# Patient Record
Sex: Male | Born: 1945
Health system: Southern US, Community
[De-identification: ages and names within clinical notes are randomized; demographics above are authoritative.]

## PROBLEM LIST (undated history)

## (undated) DIAGNOSIS — I10 Essential (primary) hypertension: Secondary | ICD-10-CM

## (undated) DIAGNOSIS — E785 Hyperlipidemia, unspecified: Secondary | ICD-10-CM

## (undated) DIAGNOSIS — E049 Nontoxic goiter, unspecified: Secondary | ICD-10-CM

## (undated) DIAGNOSIS — F32A Depression, unspecified: Secondary | ICD-10-CM

## (undated) DIAGNOSIS — T7840XA Allergy, unspecified, initial encounter: Secondary | ICD-10-CM

## (undated) DIAGNOSIS — I719 Aortic aneurysm of unspecified site, without rupture: Secondary | ICD-10-CM

## (undated) DIAGNOSIS — G5793 Unspecified mononeuropathy of bilateral lower limbs: Secondary | ICD-10-CM

## (undated) DIAGNOSIS — K219 Gastro-esophageal reflux disease without esophagitis: Secondary | ICD-10-CM

## (undated) DIAGNOSIS — H269 Unspecified cataract: Secondary | ICD-10-CM

## (undated) HISTORY — DX: Aortic aneurysm of unspecified site, without rupture: I71.9

## (undated) HISTORY — PX: COLONOSCOPY: SHX174

## (undated) HISTORY — DX: Depression, unspecified: F32.A

## (undated) HISTORY — DX: Hyperlipidemia, unspecified: E78.5

## (undated) HISTORY — DX: Nontoxic goiter, unspecified: E04.9

## (undated) HISTORY — DX: Gastro-esophageal reflux disease without esophagitis: K21.9

## (undated) HISTORY — DX: Unspecified mononeuropathy of bilateral lower limbs: G57.93

## (undated) HISTORY — DX: Essential (primary) hypertension: I10

## (undated) HISTORY — PX: SPINE SURGERY: SHX786

## (undated) HISTORY — PX: CARPAL TUNNEL RELEASE: SHX101

## (undated) HISTORY — DX: Allergy, unspecified, initial encounter: T78.40XA

## (undated) HISTORY — DX: Unspecified cataract: H26.9

## (undated) HISTORY — PX: UPPER GASTROINTESTINAL ENDOSCOPY: SHX188

## (undated) HISTORY — PX: ELBOW SURGERY: SHX618

---

## 1989-01-31 HISTORY — PX: CERVICAL FUSION: SHX112

## 1993-04-02 HISTORY — PX: CERVICAL DISCECTOMY: SHX98

## 1997-04-02 HISTORY — PX: FOOT SURGERY: SHX648

## 2011-04-03 HISTORY — PX: OTHER SURGICAL HISTORY: SHX169

## 2012-04-02 HISTORY — PX: SHOULDER ARTHROSCOPY WITH ROTATOR CUFF REPAIR: SHX5685

## 2018-04-02 DIAGNOSIS — C8590 Non-Hodgkin lymphoma, unspecified, unspecified site: Secondary | ICD-10-CM | POA: Insufficient documentation

## 2018-04-02 DIAGNOSIS — I82409 Acute embolism and thrombosis of unspecified deep veins of unspecified lower extremity: Secondary | ICD-10-CM

## 2018-04-02 HISTORY — DX: Acute embolism and thrombosis of unspecified deep veins of unspecified lower extremity: I82.409

## 2018-04-02 HISTORY — DX: Non-Hodgkin lymphoma, unspecified, unspecified site: C85.90

## 2018-12-30 LAB — HEMOGLOBIN A1C: Hemoglobin A1C: 5.9

## 2018-12-30 LAB — PSA: PSA: 1.63

## 2019-04-22 LAB — VITAMIN D 25 HYDROXY (VIT D DEFICIENCY, FRACTURES): Vit D, 25-Hydroxy: 28.7

## 2019-04-22 LAB — HEPATIC FUNCTION PANEL
ALT: 32 (ref 10–40)
AST: 26 (ref 14–40)
Alkaline Phosphatase: 58.9 (ref 25–125)
Bilirubin, Direct: 0.7 — AB (ref 0.01–0.4)
Bilirubin, Total: 1.2

## 2019-04-22 LAB — LIPID PANEL
Cholesterol: 140 (ref 0–200)
LDL Cholesterol: 76
Triglycerides: 135 (ref 40–160)

## 2019-04-22 LAB — CBC: RBC: 4.08 (ref 3.87–5.11)

## 2019-04-22 LAB — VITAMIN B12: Vitamin B-12: 397

## 2019-04-22 LAB — CBC AND DIFFERENTIAL
HCT: 40 — AB (ref 41–53)
Hemoglobin: 13 — AB (ref 13.5–17.5)
Neutrophils Absolute: 3.8
Platelets: 239 (ref 150–399)
WBC: 6.3

## 2019-04-22 LAB — TSH: TSH: 3.1 (ref 0.41–5.90)

## 2020-01-29 ENCOUNTER — Ambulatory Visit (INDEPENDENT_AMBULATORY_CARE_PROVIDER_SITE_OTHER): Payer: Medicare Other | Admitting: Physician Assistant

## 2020-01-29 ENCOUNTER — Encounter: Payer: Self-pay | Admitting: Physician Assistant

## 2020-01-29 ENCOUNTER — Other Ambulatory Visit: Payer: Self-pay

## 2020-01-29 VITALS — BP 120/68 | HR 60 | Temp 98.0°F | Ht 68.0 in | Wt 200.5 lb

## 2020-01-29 DIAGNOSIS — I712 Thoracic aortic aneurysm, without rupture, unspecified: Secondary | ICD-10-CM | POA: Insufficient documentation

## 2020-01-29 DIAGNOSIS — E049 Nontoxic goiter, unspecified: Secondary | ICD-10-CM

## 2020-01-29 DIAGNOSIS — Z1211 Encounter for screening for malignant neoplasm of colon: Secondary | ICD-10-CM

## 2020-01-29 DIAGNOSIS — A630 Anogenital (venereal) warts: Secondary | ICD-10-CM

## 2020-01-29 DIAGNOSIS — I10 Essential (primary) hypertension: Secondary | ICD-10-CM | POA: Insufficient documentation

## 2020-01-29 DIAGNOSIS — A6002 Herpesviral infection of other male genital organs: Secondary | ICD-10-CM

## 2020-01-29 DIAGNOSIS — E785 Hyperlipidemia, unspecified: Secondary | ICD-10-CM

## 2020-01-29 DIAGNOSIS — G5793 Unspecified mononeuropathy of bilateral lower limbs: Secondary | ICD-10-CM | POA: Diagnosis not present

## 2020-01-29 DIAGNOSIS — D229 Melanocytic nevi, unspecified: Secondary | ICD-10-CM

## 2020-01-29 DIAGNOSIS — Z8719 Personal history of other diseases of the digestive system: Secondary | ICD-10-CM | POA: Insufficient documentation

## 2020-01-29 DIAGNOSIS — F5101 Primary insomnia: Secondary | ICD-10-CM

## 2020-01-29 DIAGNOSIS — C8203 Follicular lymphoma grade I, intra-abdominal lymph nodes: Secondary | ICD-10-CM

## 2020-01-29 DIAGNOSIS — L700 Acne vulgaris: Secondary | ICD-10-CM

## 2020-01-29 DIAGNOSIS — I82409 Acute embolism and thrombosis of unspecified deep veins of unspecified lower extremity: Secondary | ICD-10-CM | POA: Insufficient documentation

## 2020-01-29 DIAGNOSIS — K219 Gastro-esophageal reflux disease without esophagitis: Secondary | ICD-10-CM

## 2020-01-29 DIAGNOSIS — F320 Major depressive disorder, single episode, mild: Secondary | ICD-10-CM

## 2020-01-29 MED ORDER — CLINDAMYCIN PHOS-BENZOYL PEROX 1-5 % EX GEL: Freq: Two times a day (BID) | CUTANEOUS | 0 refills | Status: DC

## 2020-01-29 NOTE — Progress Notes (Signed)
Taylor Berger is a 74 y.o. male is here to establish care.  I acted as a Education administrator for [employer], PA-C Anselmo Pickler, LPN   History of Present Illness:   Chief Complaint  Patient presents with  . Lymphoma  . GI referral    Colonoscopy    HPI   Pt is here to establish care today.  Neuropathy Hx of lower back fusion, neuropathy developed after that. Currently prescribed gabapentin 300 mg TID, but only takes this 300 mg nightly.  HTN Currently taking Lisinopril 5 mg daily, Norvasc 10 mg. At home blood pressure readings are: normal controlled. Patient denies chest pain, SOB, blurred vision, dizziness, unusual headaches, lower leg swelling. Patient is compliant with medication. Denies excessive caffeine intake, stimulant usage, excessive alcohol intake, or increase in salt consumption.  HLD Currently taking Lipitor 40 mg daily. Denies concerns.  Thoracic aortic aneurysm Was seeing cardiology for this regularly prior to moving. Last seen Feb 2020. Was getting regular CT scans. Per CT in Feb 2020, 4.2 cm mild aneursymal dilatation of the ascending thoracic aorta.  Insomnia Takes Trazodone 50 mg nightly since retirement. Takes this prn, sometimes only a few times a month.  Depression Was on Zoloft 100 mg daily. Stopped taking this on his own. Denies SI/HI. Has been off of this for a few years. Does not want to resume at this time.  HSV and genital warts Has been taking 500 mg daily valtrex (has 1000 mg tablets). Resumed this about 10 days ago, was off of this for 3 months. Also reports that he has history of getting cryotherapy for his genital warts.  Follicular lymphoma Had PET scan 2021. Saw oncologist, had confirmed biopsy in March 2021. Was told that he has Stage I Follicular Lymphoma. Needs local oncologist since moving.  GERD/Hx of Barretts Esophagram/Need for colonoscopy. Gets regularly EGD due to hx of Barretts. Denies concerns at this time. Takes nexium regularly.  Believes that he is due for repeat EGD/colonoscopy.  Acne/Atypical Moles Uses benzaclin regularly for acne. Needs refill. Would like a dermatologist for follow-up of skin checks, has hard skin/moles on face (eyebrow and temple) that he would evaluated.  Goiter Was being monitored every other year by ENT. Has been present for around 10 years. Overdue for follow-up on this. Due to his knowledge, has had normal thyroid labs in the past.    Health Maintenance Due  Topic Date Due  . Hepatitis C Screening  Never done  . TETANUS/TDAP  Never done  . PNA vac Low Risk Adult (1 of 2 - PCV13) Never done    Past Medical History:  Diagnosis Date  . Allergy    Seasonal  . Aortic aneurysm ([place])   . Depression   . DVT (deep venous thrombosis) ([place]) 2020  . GERD (gastroesophageal reflux disease)   . Goiter   . Hyperlipidemia   . Hypertension   . Lymphoma ([place]) 6440   Follicular, stomach  . Neuropathy of both feet      Social History   Tobacco Use  . Smoking status: Never Smoker  . Smokeless tobacco: Never Used  Vaping Use  . Vaping Use: Never used  Substance Use Topics  . Alcohol use: Never  . Drug use: Never    History reviewed. No pertinent surgical history.  Family History  Problem Relation Age of Onset  . Diabetes Mother   . Hypertension Mother   . Hyperlipidemia Mother   . Heart failure Mother   . Leukemia  Father   . Stroke Father     PMHx, SurgHx, SocialHx, FamHx, Medications, and Allergies were reviewed in the Visit Navigator and updated as appropriate.   Patient Active Problem List   Diagnosis Date Noted  . Hypertension 01/29/2020  . Thoracic aortic aneurysm ([place]) 01/29/2020  . DVT R femoral thigh 01/29/2020  . GERD (gastroesophageal reflux disease) 01/29/2020  . History of Barrett's esophagus 01/29/2020  . Goiter 01/29/2020  . Neuropathy of both feet     Social History   Tobacco Use  . Smoking status: Never Smoker  . Smokeless tobacco: Never Used    Vaping Use  . Vaping Use: Never used  Substance Use Topics  . Alcohol use: Never  . Drug use: Never    Current Medications and Allergies:    Current Outpatient Medications:  .  amLODipine (NORVASC) 10 MG tablet, Take 10 mg by mouth daily., Disp: , Rfl:  .  atorvastatin (LIPITOR) 40 MG tablet, Take 40 mg by mouth daily., Disp: , Rfl:  .  cetirizine (ZYRTEC) 10 MG tablet, Take 10 mg by mouth daily., Disp: , Rfl:  .  esomeprazole (NEXIUM) 20 MG capsule, Take 20 mg by mouth daily at 12 noon., Disp: , Rfl:  .  fluticasone (FLONASE) 50 MCG/ACT nasal spray, Place into both nostrils as needed for allergies or rhinitis., Disp: , Rfl:  .  gabapentin (NEURONTIN) 300 MG capsule, Take 300 mg by mouth 3 (three) times daily., Disp: , Rfl:  .  lisinopril (ZESTRIL) 5 MG tablet, Take 5 mg by mouth daily., Disp: , Rfl:  .  methocarbamol (ROBAXIN) 750 MG tablet, Take 750 mg by mouth as needed for muscle spasms., Disp: , Rfl:  .  traZODone (DESYREL) 50 MG tablet, Take 50 mg by mouth at bedtime as needed for sleep., Disp: , Rfl:  .  valACYclovir (VALTREX) 500 MG tablet, Take 500 mg by mouth daily., Disp: , Rfl:  .  clindamycin-benzoyl peroxide (BENZACLIN) gel, Apply topically 2 (two) times daily., Disp: 50 g, Rfl: 0  No Known Allergies  Review of Systems   ROS  Negative unless otherwise specified per HPI.  Vitals:   Vitals:   01/29/20 1342  BP: 120/68  Pulse: 60  Temp: 98 F (36.7 C)  TempSrc: Temporal  SpO2: 94%  Weight: 200 lb 8 oz (90.9 kg)  Height: 5\' 8"  (1.727 m)     Body mass index is 30.49 kg/m.   Physical Exam:    Physical Exam Vitals and nursing note reviewed.  Constitutional:      General: He is not in acute distress.    Appearance: He is well-developed. He is not ill-appearing or toxic-appearing.  Cardiovascular:     Rate and Rhythm: Normal rate and regular rhythm.     Pulses: Normal pulses.     Heart sounds: Normal heart sounds, S1 normal and S2 normal.     Comments:  No LE edema Pulmonary:     Effort: Pulmonary effort is normal.     Breath sounds: Normal breath sounds.  Skin:    General: Skin is warm and dry.  Neurological:     Mental Status: He is alert.     GCS: GCS eye subscore is 4. GCS verbal subscore is 5. GCS motor subscore is 6.  Psychiatric:        Speech: Speech normal.        Behavior: Behavior normal. Behavior is cooperative.      Assessment and Plan:    Taylor Berger  was seen today for lymphoma and gi referral.  Diagnoses and all orders for this visit:  Neuropathy of both feet Stable. Continue gabapentin 300 mg nightly. Follow-up in 6 months, sooner if concerns.  Primary hypertension Well controlled in office today. Continue Norvasc 10 mg and Lisinopril 5 mg daily. Follow-up in 6 months, sooner if concerns. -     Ambulatory referral to Cardiology  Hyperlipidemia, unspecified hyperlipidemia type Well controlled per patient. Continue lipitor 40 mg daily. Follow-up in 6 months, sooner if concerns. -     Ambulatory referral to Cardiology  Thoracic aortic aneurysm without rupture [facility]) Referral to cardiology. Stable, denies symptoms. -     Ambulatory referral to Cardiology  Primary insomnia Stable. Continue prn 50 mg Trazodone. Follow-up in 6 months, sooner if concerns.  Depression, major, single episode, mild ([place]) Denies any concerns at this time. Would like to continue to hold off on Zoloft at this time. Follow-up in 6 months, sooner if concerns.  Herpes simplex infection of other site of male genital organ Continue 500 mg valtrex daily. Follow-up in 6 months, sooner if concerns.  Genital warts Not evaluated today, will refer to dermatology. -     Ambulatory referral to Dermatology  Gastroesophageal reflux disease, unspecified whether esophagitis present; History of Barrett's esophagus; Special screening for malignant neoplasms, colon Referral to GI for further evaluation and management. Continue OTC nexium. -      Ambulatory referral to Gastroenterology  Goiter Referral to ENT. -     Ambulatory referral to ENT  Acne vulgaris; Atypical mole Refill benzaclin at this time. Referral to derm for annual skin checks and atypical mole evaluation. -     Ambulatory referral to Dermatology  Follicular lymphoma grade I of intra-abdominal lymph nodes [facility]) Referral to oncology. -     Ambulatory referral to Hematology / Oncology  Other orders -     clindamycin-benzoyl peroxide (BENZACLIN) gel; Apply topically 2 (two) times daily.   Time spent with patient today was 45 minutes which consisted of chart review, discussing diagnosis, work up, treatment answering questions and documentation.  Inda Coke, PA-C [place], [place] 01/29/2020  Follow-up: No follow-ups on file.

## 2020-01-29 NOTE — Patient Instructions (Signed)
It was great to see you!  Referrals: ENT Oncology Gastroenterology Dermatology  Please allow two weeks for referrals to be processed, if you don't hear anything by then, let us know.  Let's follow-up in 6 months, sooner if you have concerns.  Take care,  Inda Coke PA-C

## 2020-02-03 ENCOUNTER — Other Ambulatory Visit: Payer: Self-pay | Admitting: Physician Assistant

## 2020-02-03 ENCOUNTER — Telehealth: Payer: Self-pay

## 2020-02-03 NOTE — Telephone Encounter (Signed)
Pt came into office stating he is usually prescribed clindamycin-benzoyl peroxide. Pt states Samantha prescribed him gel at his last visit and he would like for that to be changed back to his original prescription (topical solution). Please advise.

## 2020-02-03 NOTE — Telephone Encounter (Signed)
Please see message. °

## 2020-02-03 NOTE — Telephone Encounter (Signed)
The only option I see that is available to me is a clindamycin external solution. Would he like this?

## 2020-02-04 NOTE — Telephone Encounter (Signed)
Left message on voicemail to call office.  

## 2020-02-05 ENCOUNTER — Other Ambulatory Visit: Payer: Self-pay | Admitting: Physician Assistant

## 2020-02-05 MED ORDER — CLINDAMYCIN PHOSPHATE 1 % EX SOLN: Freq: Two times a day (BID) | CUTANEOUS | 0 refills | Status: DC

## 2020-02-05 NOTE — Telephone Encounter (Signed)
Pt would like the external solution.

## 2020-02-05 NOTE — Telephone Encounter (Signed)
Please see message. °

## 2020-02-05 NOTE — Telephone Encounter (Signed)
Sent!

## 2020-02-05 NOTE — Telephone Encounter (Signed)
Left detailed message on voicemail Rx was sent to pharmacy.

## 2020-02-10 ENCOUNTER — Other Ambulatory Visit: Payer: Self-pay

## 2020-02-10 ENCOUNTER — Ambulatory Visit (INDEPENDENT_AMBULATORY_CARE_PROVIDER_SITE_OTHER): Payer: Medicare Other | Admitting: Internal Medicine

## 2020-02-10 VITALS — BP 134/74 | HR 51 | Ht 68.0 in | Wt 205.0 lb

## 2020-02-10 DIAGNOSIS — E785 Hyperlipidemia, unspecified: Secondary | ICD-10-CM

## 2020-02-10 DIAGNOSIS — I10 Essential (primary) hypertension: Secondary | ICD-10-CM

## 2020-02-10 DIAGNOSIS — R931 Abnormal findings on diagnostic imaging of heart and coronary circulation: Secondary | ICD-10-CM | POA: Diagnosis not present

## 2020-02-10 DIAGNOSIS — I712 Thoracic aortic aneurysm, without rupture, unspecified: Secondary | ICD-10-CM

## 2020-02-10 DIAGNOSIS — R072 Precordial pain: Secondary | ICD-10-CM | POA: Diagnosis not present

## 2020-02-10 NOTE — Progress Notes (Signed)
Cardiology Office Note:    Date:  02/10/2020   ID:  Taylor Berger, DOB [birth year], MRN [id number]  PCP:  Inda Coke, [place]  Cardiologist:  No primary care provider on file.  Electrophysiologist:  None   Referring MD: Inda Coke, PA   Chief Complaint/Reason for Referral: HTN, thoracic aortic aneurysm  History of Present Illness:    Taylor Berger is a 74 y.o. male with a history of follicular lymphoma by biopsy March 2021, HTN, thoracic aortic aneurysm measuring 4.2 cm in Feb 2020, DVT, HLD. Presents to establish cardiovascular care.   Overall feels well but notes occasional substernal, nonradiating chest discomfort. He does not have a known history of CAD per his report. Takes lisinopril and amlodipine for HTN, reasonable BP control. Takes atorvastatin for HLD, LDL in January 74. Nexium for GERD.   The patient denies dyspnea at rest or with exertion, palpitations, PND, orthopnea, or leg swelling. Denies cough, fever, chills. Denies nausea, vomiting. Denies syncope or presyncope. Denies dizziness or lightheadedness. Denies snoring.   Past Medical History:  Diagnosis Date  . Allergy    Seasonal  . Aortic aneurysm ([place])   . Depression   . DVT (deep venous thrombosis) (Taylor Berger) 2020  . GERD (gastroesophageal reflux disease)   . Goiter   . Hyperlipidemia   . Hypertension   . Lymphoma (Taylor Berger) 3785   Follicular, stomach  . Neuropathy of both feet     No past surgical history on file.  Current Medications: Current Meds  Medication Sig  . amLODipine (NORVASC) 10 MG tablet Take 10 mg by mouth daily.  Marland Kitchen atorvastatin (LIPITOR) 40 MG tablet Take 40 mg by mouth daily.  . cetirizine (ZYRTEC) 10 MG tablet Take 10 mg by mouth daily.  . clindamycin (CLEOCIN-T) 1 % external solution Apply topically 2 (two) times daily.  Marland Kitchen esomeprazole (NEXIUM) 20 MG capsule Take 20 mg by mouth daily at 12 noon.  . fluticasone (FLONASE) 50 MCG/ACT nasal spray Place into both nostrils as needed for  allergies or rhinitis.  Marland Kitchen gabapentin (NEURONTIN) 300 MG capsule Take 300 mg by mouth 3 (three) times daily.  Marland Kitchen lisinopril (ZESTRIL) 5 MG tablet Take 5 mg by mouth daily.  . valACYclovir (VALTREX) 500 MG tablet Take 500 mg by mouth daily.  . [DISCONTINUED] methocarbamol (ROBAXIN) 750 MG tablet Take 750 mg by mouth as needed for muscle spasms.  . [DISCONTINUED] traZODone (DESYREL) 50 MG tablet Take 50 mg by mouth at bedtime as needed for sleep.     Allergies:   Patient has no known allergies.   Social History   Tobacco Use  . Smoking status: Never Smoker  . Smokeless tobacco: Never Used  Vaping Use  . Vaping Use: Never used  Substance Use Topics  . Alcohol use: Never  . Drug use: Never     Family History: The patient's family history includes Diabetes in his mother; Heart failure in his mother; Hyperlipidemia in his mother; Hypertension in his mother; Leukemia in his father; Stroke in his father.  ROS:   Please see the history of present illness.    All other systems reviewed and are negative.  EKGs/Labs/Other Studies Reviewed:    The following studies were reviewed today:  EKG:  Sinus bradycardia, 1st deg AVB, left axis deviation, LVH  Recent Labs: No results found for requested labs within last 8760 hours.  Recent Lipid Panel    Component Value Date/Time   CHOL 140 04/22/2019 0000   TRIG 135 04/22/2019 0000  Taylor Berger 74 04/22/2019 0000    Physical Exam:    VS:  BP 134/74 (BP Location: Left Arm, Patient Position: Sitting, Cuff Size: Normal)   Pulse (!) 51   Ht '5\' 8"'  (1.727 m)   Wt 205 lb (93 kg)   SpO2 98%   BMI 31.17 kg/m     Wt Readings from Last 5 Encounters:  02/10/20 205 lb (93 kg)  01/29/20 200 lb 8 oz (90.9 kg)    Constitutional: No acute distress Eyes: sclera non-icteric, normal conjunctiva and lids ENMT: normal dentition, moist mucous membranes Cardiovascular: regular rhythm, normal rate, no murmurs. S1 and S2 normal. Radial pulses normal  bilaterally. No jugular venous distention.  Respiratory: clear to auscultation bilaterally GI : normal bowel sounds, soft and nontender. No distention.   MSK: extremities warm, well perfused. No edema.  NEURO: grossly nonfocal exam, moves all extremities. PSYCH: alert and oriented x 3, normal mood and affect.   ASSESSMENT:    1. Precordial pain   2. Thoracic aortic aneurysm without rupture ([place])   3. Abnormal findings on diagnostic imaging of heart and coronary circulation    4. Primary hypertension   5. Hyperlipidemia, unspecified hyperlipidemia type    PLAN:    He will need reevaluation of his thoracic aortic aneurysm in the setting of HTN. To evaluate this as well as his description of chest discomfort, we will obtain a CCTA to get a gated assessment of the coronaries, as well as a CT chest to visualize the aorta for baseline imaging in [place] and accurate measurements. He is overdue for repeat imaging, this can be performed now, with follow up afterward to determine next interval for imaging. He had a grossly normal echo from 2018 at OSH - EF 60-65%, grade 1 ddx, mild basal LVH. We will get a better look at the aorta with CT, and will perform echo at next follow up.   HTN -continue amlodipine and lisinopril.  HLD - continue atorvastatin.   Follicular lymphoma - establish with heme onc.   DVT - diagnosed 1 year ago, not currently on anticoagulation. Treated for 6 mo.  Review with oncology, presume unprovoked.    Total time of encounter: 45 minutes total time of encounter, including 30 minutes spent in face-to-face patient care on the date of this encounter. This time includes coordination of care and counseling regarding above mentioned problem list. Remainder of non-face-to-face time involved reviewing chart documents/testing relevant to the patient encounter and documentation in the medical record. I have independently reviewed documentation from referring provider.   Cherlynn Kaiser, MD [place]  [facility]    Medication Adjustments/Labs and Tests Ordered: Current medicines are reviewed at length with the patient today.  Concerns regarding medicines are outlined above.   Orders Placed This Encounter  Procedures  . CT CORONARY MORPH W/CTA COR W/SCORE W/CA W/CM &/OR WO/CM  . CT CORONARY FRACTIONAL FLOW RESERVE DATA PREP  . CT CORONARY FRACTIONAL FLOW RESERVE FLUID ANALYSIS  . CT Chest W Contrast  . Basic metabolic panel  . EKG 12-Lead    No orders of the defined types were placed in this encounter.   Patient Instructions  Medication Instructions:  No Changes In Medications at this time.  *If you need a refill on your cardiac medications before your next appointment, please call your pharmacy*  Lab Work: BMET- 1 week prior to CTA in [place]  If you have labs (blood work) drawn today and your tests are completely normal, you  will receive your results only by: Marland Kitchen MyChart Message (if you have MyChart) OR . A paper copy in the mail If you have any lab test that is abnormal or we need to change your treatment, we will call you to review the results.  Testing/Procedures: Your physician has requested that you have cardiac CT. Cardiac computed tomography (CT) is a painless test that uses an x-ray machine to take clear, detailed pictures of your heart. For further information please visit HugeFiesta.tn. Please follow instruction sheet as given.  Follow-Up: At [facility], you and your health needs are our priority.  As part of our continuing mission to provide you with exceptional heart care, we have created designated Provider Care Teams.  These Care Teams include your primary Cardiologist (physician) and Advanced Practice Providers (APPs -  Physician Assistants and Nurse Practitioners) who all work together to provide you with the care you need, when you need it.  We recommend signing up for the patient portal called "MyChart".  Sign up  information is provided on this After Visit Summary.  MyChart is used to connect with patients for Virtual Visits (Telemedicine).  Patients are able to view lab/test results, encounter notes, upcoming appointments, etc.  Non-urgent messages can be sent to your provider as well.   To learn more about what you can do with MyChart, go to [email or web address].    Your next appointment:   3 month(s) (appointment following CCTA for follow up results)  The format for your next appointment:   In Person  Provider:   Cherlynn Kaiser, MD  Other Instructions  Your cardiac CT will be scheduled at one of the below locations:   [facility] [address] [place], [place] [address] [phone]  If scheduled at [facility], please arrive at the [facility] main entrance of [facility] 30 minutes prior to test start time. Proceed to the [facility] Radiology Department (first floor) to check-in and test prep.  Please follow these instructions carefully (unless otherwise directed):  Hold all erectile dysfunction medications at least 3 days (72 hrs) prior to test.  On the Night Before the Test: . Be sure to Drink plenty of water. . Do not consume any caffeinated/decaffeinated beverages or chocolate 12 hours prior to your test. . Do not take any antihistamines 12 hours prior to your test.  On the Day of the Test: . Drink plenty of water. Do not drink any water within one hour of the test. . Do not eat any food 4 hours prior to the test. . You may take your regular medications prior to the test.  . HOLD Furosemide/Hydrochlorothiazide morning of the test. . FEMALES- please wear underwire-free bra if available  After the Test: . Drink plenty of water. . After receiving IV contrast, you may experience a mild flushed feeling. This is normal. . On occasion, you may experience a mild rash up to 24 hours after the test. This is not dangerous. If this occurs, you can  take Benadryl 25 mg and increase your fluid intake. . If you experience trouble breathing, this can be serious. If it is severe call 911 IMMEDIATELY. If it is mild, please call our office. . If you take any of these medications: Glipizide/Metformin, Avandament, Glucavance, please do not take 48 hours after completing test unless otherwise instructed.  Once we have confirmed authorization from your insurance company, we will call you to set up a date and time for your test. Based on  how quickly your insurance processes prior authorizations requests, please allow up to 4 weeks to be contacted for scheduling your Cardiac CT appointment. Be advised that routine Cardiac CT appointments could be scheduled as many as 8 weeks after your provider has ordered it.  For non-scheduling related questions, please contact the cardiac imaging nurse navigator should you have any questions/concerns: Marchia Bond, Cardiac Imaging Nurse Navigator Burley Saver, Interim Cardiac Imaging Nurse [place] [facility] Direct Office Dial: [phone]   For scheduling needs, including cancellations and rescheduling, please call Vivien Rota at [phone], option 3.

## 2020-02-10 NOTE — Patient Instructions (Addendum)
Medication Instructions:  No Changes In Medications at this time.  *If you need a refill on your cardiac medications before your next appointment, please call your pharmacy*  Lab Work: BMET- 1 week prior to CTA in [place]  If you have labs (blood work) drawn today and your tests are completely normal, you will receive your results only by: Marland Kitchen MyChart Message (if you have MyChart) OR . A paper copy in the mail If you have any lab test that is abnormal or we need to change your treatment, we will call you to review the results.  Testing/Procedures: Your physician has requested that you have cardiac CT. Cardiac computed tomography (CT) is a painless test that uses an x-ray machine to take clear, detailed pictures of your heart. For further information please visit [email or web address].tn. Please follow instruction sheet as given.  Follow-Up: At [facility], you and your health needs are our priority.  As part of our continuing mission to provide you with exceptional heart care, we have created designated Provider Care Teams.  These Care Teams include your primary Cardiologist (physician) and Advanced Practice Providers (APPs -  Physician Assistants and Nurse Practitioners) who all work together to provide you with the care you need, when you need it.  We recommend signing up for the patient portal called "MyChart".  Sign up information is provided on this After Visit Summary.  MyChart is used to connect with patients for Virtual Visits (Telemedicine).  Patients are able to view lab/test results, encounter notes, upcoming appointments, etc.  Non-urgent messages can be sent to your provider as well.   To learn more about what you can do with MyChart, go to [email or web address].    Your next appointment:   3 month(s) (appointment following CCTA for follow up results)  The format for your next appointment:   In Person  Provider:   Cherlynn Kaiser, MD  Other Instructions  Your cardiac CT  will be scheduled at one of the below locations:   [facility] [address] [place], [place] [address] [phone]  If scheduled at [facility], please arrive at the [facility] main entrance of [facility] 30 minutes prior to test start time. Proceed to the [facility] Radiology Department (first floor) to check-in and test prep.  Please follow these instructions carefully (unless otherwise directed):  Hold all erectile dysfunction medications at least 3 days (72 hrs) prior to test.  On the Night Before the Test: . Be sure to Drink plenty of water. . Do not consume any caffeinated/decaffeinated beverages or chocolate 12 hours prior to your test. . Do not take any antihistamines 12 hours prior to your test.  On the Day of the Test: . Drink plenty of water. Do not drink any water within one hour of the test. . Do not eat any food 4 hours prior to the test. . You may take your regular medications prior to the test.  . HOLD Furosemide/Hydrochlorothiazide morning of the test. . FEMALES- please wear underwire-free bra if available  After the Test: . Drink plenty of water. . After receiving IV contrast, you may experience a mild flushed feeling. This is normal. . On occasion, you may experience a mild rash up to 24 hours after the test. This is not dangerous. If this occurs, you can take Benadryl 25 mg and increase your fluid intake. . If you experience trouble breathing, this can be serious. If it is severe call 911 IMMEDIATELY. If it is  mild, please call our office. . If you take any of these medications: Glipizide/Metformin, Avandament, Glucavance, please do not take 48 hours after completing test unless otherwise instructed.  Once we have confirmed authorization from your insurance company, we will call you to set up a date and time for your test. Based on how quickly your insurance processes prior authorizations requests, please allow up to 4 weeks to  be contacted for scheduling your Cardiac CT appointment. Be advised that routine Cardiac CT appointments could be scheduled as many as 8 weeks after your provider has ordered it.  For non-scheduling related questions, please contact the cardiac imaging nurse navigator should you have any questions/concerns: Marchia Bond, Cardiac Imaging Nurse Navigator Burley Saver, Interim Cardiac Imaging Nurse [place] [facility] Direct Office Dial: [phone]   For scheduling needs, including cancellations and rescheduling, please call Vivien Rota at [phone], option 3.

## 2020-02-11 ENCOUNTER — Encounter: Payer: Self-pay | Admitting: *Deleted

## 2020-02-11 ENCOUNTER — Other Ambulatory Visit: Payer: Self-pay | Admitting: *Deleted

## 2020-02-11 NOTE — Progress Notes (Signed)
Error

## 2020-02-24 ENCOUNTER — Telehealth: Payer: Self-pay

## 2020-02-24 ENCOUNTER — Encounter: Payer: Self-pay | Admitting: Gastroenterology

## 2020-02-24 ENCOUNTER — Other Ambulatory Visit: Payer: Self-pay

## 2020-02-24 DIAGNOSIS — R931 Abnormal findings on diagnostic imaging of heart and coronary circulation: Secondary | ICD-10-CM

## 2020-02-24 DIAGNOSIS — I712 Thoracic aortic aneurysm, without rupture, unspecified: Secondary | ICD-10-CM

## 2020-02-24 DIAGNOSIS — R072 Precordial pain: Secondary | ICD-10-CM

## 2020-02-24 MED ORDER — ATORVASTATIN CALCIUM 40 MG PO TABS
40.0000 mg | ORAL_TABLET | Freq: Every day | ORAL | 1 refills | Status: DC
Start: 2020-02-24 — End: 2020-09-08

## 2020-02-24 MED ORDER — LISINOPRIL 5 MG PO TABS
5.0000 mg | ORAL_TABLET | Freq: Every day | ORAL | 1 refills | Status: DC
Start: 2020-02-24 — End: 2020-09-08

## 2020-02-24 MED ORDER — VALACYCLOVIR HCL 500 MG PO TABS
500.0000 mg | ORAL_TABLET | Freq: Every day | ORAL | 1 refills | Status: DC
Start: 2020-02-24 — End: 2020-04-12

## 2020-02-24 MED ORDER — GABAPENTIN 300 MG PO CAPS
300.0000 mg | ORAL_CAPSULE | Freq: Three times a day (TID) | ORAL | 1 refills | Status: DC
Start: 2020-02-24 — End: 2020-10-27

## 2020-02-24 MED ORDER — AMLODIPINE BESYLATE 10 MG PO TABS
10.0000 mg | ORAL_TABLET | Freq: Every day | ORAL | 1 refills | Status: DC
Start: 2020-02-24 — End: 2020-09-08

## 2020-02-24 NOTE — Telephone Encounter (Signed)
MEDICATION: valACYclovir (VALTREX) 500 MG tablet gabapentin (NEURONTIN) 300 MG capsule lisinopril (ZESTRIL) 5 MG tablet amLODipine (NORVASC) 10 MG tablet atorvastatin (LIPITOR) 40 MG tablet  PHARMACY:  [place], [place] Phone:  [phone]  Fax:  [phone]       Comments:  Patient would like 90day sent in.    **Let patient know to contact pharmacy at the end of the day to make sure medication is ready. **  ** Please notify patient to allow 48-72 hours to process**  **Encourage patient to contact the pharmacy for refills or they can request refills through [facility]**

## 2020-02-24 NOTE — Telephone Encounter (Signed)
Spoke to pt told him Rx's were sent to pharmacy 90 day supply as requested. Pt verbalized understanding.

## 2020-02-24 NOTE — Telephone Encounter (Signed)
Left message for patient to call me back in regards to a referral to the cancer center.    The cancer center is needing previous oncology notes before they will schedule.

## 2020-02-25 LAB — BASIC METABOLIC PANEL
BUN/Creatinine Ratio: 19 (ref 10–24)
BUN: 16 mg/dL (ref 8–27)
CO2: 28 mmol/L (ref 20–29)
Calcium: 9.6 mg/dL (ref 8.6–10.2)
Chloride: 106 mmol/L (ref 96–106)
Creatinine, Ser: 0.86 mg/dL (ref 0.76–1.27)
GFR calc Af Amer: 98 mL/min/{1.73_m2} (ref >59)
GFR calc non Af Amer: 85 mL/min/{1.73_m2} (ref >59)
Glucose: 86 mg/dL (ref 65–99)
Potassium: 4.5 mmol/L (ref 3.5–5.2)
Sodium: 144 mmol/L (ref 134–144)

## 2020-03-03 ENCOUNTER — Telehealth: Payer: Self-pay | Admitting: Hematology and Oncology

## 2020-03-03 NOTE — Telephone Encounter (Signed)
Received a new pt referral from Dr. Morene Rankins for follicular lymphoma. Taylor Berger cld to schedule an appt w/Dr. Lorenso Courier on 12/13 at 1pm. Pt aware to arrive 30 minutes early.

## 2020-03-07 ENCOUNTER — Encounter: Payer: Self-pay | Admitting: Physician Assistant

## 2020-03-07 ENCOUNTER — Other Ambulatory Visit: Payer: Self-pay

## 2020-03-07 ENCOUNTER — Ambulatory Visit
Admission: EM | Admit: 2020-03-07 | Discharge: 2020-03-07 | Disposition: A | Discharge status: Home / Self Care | Payer: Medicare Other | Attending: Emergency Medicine | Admitting: Emergency Medicine

## 2020-03-07 ENCOUNTER — Encounter: Payer: Self-pay | Admitting: *Deleted

## 2020-03-07 ENCOUNTER — Ambulatory Visit (INDEPENDENT_AMBULATORY_CARE_PROVIDER_SITE_OTHER): Payer: Medicare Other

## 2020-03-07 ENCOUNTER — Telehealth (INDEPENDENT_AMBULATORY_CARE_PROVIDER_SITE_OTHER): Payer: Medicare Other | Admitting: Physician Assistant

## 2020-03-07 DIAGNOSIS — R059 Cough, unspecified: Secondary | ICD-10-CM

## 2020-03-07 DIAGNOSIS — J22 Unspecified acute lower respiratory infection: Secondary | ICD-10-CM | POA: Diagnosis not present

## 2020-03-07 DIAGNOSIS — C8203 Follicular lymphoma grade I, intra-abdominal lymph nodes: Secondary | ICD-10-CM | POA: Insufficient documentation

## 2020-03-07 MED ORDER — AZITHROMYCIN 250 MG PO TABS: ORAL_TABLET | ORAL | 0 refills | Status: DC

## 2020-03-07 MED ORDER — BENZONATATE 200 MG PO CAPS: 200.0000 mg | ORAL_CAPSULE | Freq: Three times a day (TID) | ORAL | 0 refills | Status: AC | PRN

## 2020-03-07 MED ORDER — ACETAMINOPHEN 325 MG PO TABS
650.0000 mg | ORAL_TABLET | Freq: Once | ORAL | Status: AC
  Administered 2020-03-07: 650 mg via ORAL

## 2020-03-07 MED ORDER — AMOXICILLIN-POT CLAVULANATE 875-125 MG PO TABS: 1.0000 | ORAL_TABLET | Freq: Two times a day (BID) | ORAL | 0 refills | Status: DC

## 2020-03-07 MED ORDER — PREDNISONE 50 MG PO TABS: 50.0000 mg | ORAL_TABLET | Freq: Every day | ORAL | 0 refills | Status: DC

## 2020-03-07 MED ORDER — METHYLPREDNISOLONE SODIUM SUCC 125 MG IJ SOLR
80.0000 mg | Freq: Once | INTRAMUSCULAR | Status: AC
  Administered 2020-03-07: 80 mg via INTRAMUSCULAR

## 2020-03-07 MED ORDER — ALBUTEROL SULFATE HFA 108 (90 BASE) MCG/ACT IN AERS
2.0000 | INHALATION_SPRAY | Freq: Once | RESPIRATORY_TRACT | Status: AC
  Administered 2020-03-07: 2 via RESPIRATORY_TRACT

## 2020-03-07 NOTE — Progress Notes (Signed)
Virtual Visit via Video   I connected with Taylor Berger on 03/07/20 at 12:30 PM EST by a video enabled telemedicine application and verified that I am speaking with the correct person using two identifiers. Location patient: Home Location provider: [facility], Office Persons participating in the virtual visit: Taylor Berger, [place] PA-C  I discussed the limitations of evaluation and management by telemedicine and the availability of in person appointments. The patient expressed understanding and agreed to proceed.  Subjective:   HPI:   Patient is requesting evaluation for URI.  1 week of cold symptoms, started after spending Thanksgiving with children and grandchildren who had colds.  Symptom onset: 1 week  Vaccination status: fully vaccinated for COVID and flue  Patient endorses the following symptoms: sinus congestion, productive cough (productive), wheezing and shortness of breath, subjective fever, chest pain, poor appetite, fatigue  Treatments tried: expectorant  Patient risk factors: Current LEXNT-70 risk of complications score: 5 Smoking status: Taylor Berger  reports that he has never smoked. He has never used smokeless tobacco. If male, currently pregnant? []   Yes []   No  ROS: See pertinent positives and negatives per HPI.  Patient Active Problem List   Diagnosis Date Noted  . Follicular lymphoma grade I of intra-abdominal lymph nodes ([place]) 03/07/2020  . Hypertension 01/29/2020  . Thoracic aortic aneurysm ([place]) 01/29/2020  . DVT R femoral thigh 01/29/2020  . GERD (gastroesophageal reflux disease) 01/29/2020  . History of Barrett's esophagus 01/29/2020  . Goiter 01/29/2020  . Neuropathy of both feet   . Lymphoma ([place]) 2020    Social History   Tobacco Use  . Smoking status: Never Smoker  . Smokeless tobacco: Never Used  Substance Use Topics  . Alcohol use: Never    Current Outpatient Medications:  .  amLODipine (NORVASC) 10 MG tablet, Take 1  tablet (10 mg total) by mouth daily., Disp: 90 tablet, Rfl: 1 .  atorvastatin (LIPITOR) 40 MG tablet, Take 1 tablet (40 mg total) by mouth daily., Disp: 90 tablet, Rfl: 1 .  cetirizine (ZYRTEC) 10 MG tablet, Take 10 mg by mouth daily., Disp: , Rfl:  .  clindamycin (CLEOCIN-T) 1 % external solution, Apply topically 2 (two) times daily., Disp: 30 mL, Rfl: 0 .  esomeprazole (NEXIUM) 20 MG capsule, Take 20 mg by mouth daily at 12 noon., Disp: , Rfl:  .  fluticasone (FLONASE) 50 MCG/ACT nasal spray, Place into both nostrils as needed for allergies or rhinitis., Disp: , Rfl:  .  gabapentin (NEURONTIN) 300 MG capsule, Take 1 capsule (300 mg total) by mouth 3 (three) times daily., Disp: 270 capsule, Rfl: 1 .  lisinopril (ZESTRIL) 5 MG tablet, Take 1 tablet (5 mg total) by mouth daily., Disp: 90 tablet, Rfl: 1 .  valACYclovir (VALTREX) 500 MG tablet, Take 1 tablet (500 mg total) by mouth daily., Disp: 90 tablet, Rfl: 1  No Known Allergies  Objective:   VITALS: Per patient if applicable, see vitals. GENERAL: Alert, appears well and in no acute distress. HEENT: Atraumatic, conjunctiva clear, no obvious abnormalities on inspection of external nose and ears. NECK: Normal movements of the head and neck. CARDIOPULMONARY: No increased WOB. Speaking in clear sentences. I:E ratio WNL.  MS: Moves all visible extremities without noticeable abnormality. PSYCH: Pleasant and cooperative, well-groomed. Speech normal rate and rhythm. Affect is appropriate. Insight and judgement are appropriate. Attention is focused, linear, and appropriate.  NEURO: CN grossly intact. Oriented as arrived to appointment on time with no prompting. Moves  both UE equally.  SKIN: No obvious lesions, wounds, erythema, or cyanosis noted on face or hands.  Assessment and Plan:   Taylor Berger was seen today for shortness of breath, wheezing, cough, abdominal pain and losing his voice.  Diagnoses and all orders for this visit:  Cough   Due to  chest pain, SOB, dyspnea and multiple chronic medical issues, recommend patient go to ER or UC for evaluation. He has opted for Urgent Care. Will follow along case as he goes to Urgent Care for further evaluation.  I discussed the assessment and treatment plan with the patient. The patient was provided an opportunity to ask questions and all were answered. The patient agreed with the plan and demonstrated an understanding of the instructions.   The patient was advised to call back or seek an in-person evaluation if the symptoms worsen or if the condition fails to improve as anticipated.    Taylor Berger, [place] 03/07/2020

## 2020-03-07 NOTE — ED Triage Notes (Signed)
Patient reports 10 day history of cough, nasal congestion, wheezing and shortness of breath. Reports fever but did not check. Patient with shortened sentences during triage due to coughing and shortness of breath, audible intermittent wheezing with cough.   Has taken cough medicine and mucinex.

## 2020-03-07 NOTE — Discharge Instructions (Signed)
Augmentin twice daily for 1 week Azithromycin- 2 tabs tonight, 1 tab following 4 days Continue inhaler as needed Prednisone daily Go to emergency room if oxygen not improving, worsening

## 2020-03-07 NOTE — ED Notes (Signed)
Additional 2 puffs of albuterol

## 2020-03-07 NOTE — ED Notes (Signed)
Patient states that he is feeling better--less shortness of breath and coughing. Patient appears better and appears more relaxed.

## 2020-03-08 ENCOUNTER — Telehealth: Payer: Self-pay | Admitting: Emergency Medicine

## 2020-03-08 ENCOUNTER — Ambulatory Visit (HOSPITAL_COMMUNITY): Admission: RE | Admit: 2020-03-08 | Payer: Medicare Other | Source: Ambulatory Visit

## 2020-03-08 ENCOUNTER — Encounter (HOSPITAL_COMMUNITY): Payer: Self-pay | Admitting: Emergency Medicine

## 2020-03-08 ENCOUNTER — Ambulatory Visit (HOSPITAL_COMMUNITY): Payer: Medicare Other

## 2020-03-08 ENCOUNTER — Emergency Department (HOSPITAL_COMMUNITY)
Admission: EM | Admit: 2020-03-08 | Discharge: 2020-03-08 | Disposition: A | Discharge status: Home / Self Care | Payer: Medicare Other | Attending: Emergency Medicine | Admitting: Emergency Medicine

## 2020-03-08 DIAGNOSIS — Z5321 Procedure and treatment not carried out due to patient leaving prior to being seen by health care provider: Secondary | ICD-10-CM | POA: Diagnosis not present

## 2020-03-08 DIAGNOSIS — R0902 Hypoxemia: Secondary | ICD-10-CM | POA: Diagnosis not present

## 2020-03-08 DIAGNOSIS — J209 Acute bronchitis, unspecified: Secondary | ICD-10-CM | POA: Diagnosis not present

## 2020-03-08 DIAGNOSIS — J189 Pneumonia, unspecified organism: Secondary | ICD-10-CM | POA: Insufficient documentation

## 2020-03-08 NOTE — Telephone Encounter (Signed)
Called patient for follow-up from visit last night, reported he was able to pick up antibiotics, breathing has continued to be improved from initial presentation, but feels his hoarseness is returning.  Obtained pulse oximeter over-the-counter, but was not able to get this working correctly at home.  Encourage patient to follow-up in emergency room given O2 last night, advised he may return here as well for recheck of O2, but if symptoms not improving he will need further evaluation in emergency room.

## 2020-03-08 NOTE — ED Provider Notes (Addendum)
[facility]    CSN: [id number] Arrival date & time: 03/07/20  1553      History   Chief Complaint Chief Complaint  Patient presents with  . Cough  . Nasal Congestion  . Shortness of Breath    HPI Taylor Berger is a 74 y.o. male history of aortic aneurysm, GERD, hypertension, presenting today for evaluation of a cough.  Patient reports over the past 10 days he has had cough congestion wheezing and shortness of breath.  He has had some subjective fevers.  Symptoms have persisted and slightly worse shortness of breath and wheezing of recently.  Denies history of asthma, COPD or tobacco use.  He has had prior bronchitis and pneumonia.  Using over-the-counter cough medicine and Mucinex.  Denies leg pain or leg swelling.    HPI  Past Medical History:  Diagnosis Date  . Allergy    Seasonal  . Aortic aneurysm ([place])   . Depression   . DVT (deep venous thrombosis) ([place]) 2020  . GERD (gastroesophageal reflux disease)   . Goiter   . Hyperlipidemia   . Hypertension   . Lymphoma ([place]) 5643   Follicular, stomach  . Neuropathy of both feet     Patient Active Problem List   Diagnosis Date Noted  . Follicular lymphoma grade I of intra-abdominal lymph nodes ([place]) 03/07/2020  . Hypertension 01/29/2020  . Thoracic aortic aneurysm ([place]) 01/29/2020  . DVT R femoral thigh 01/29/2020  . GERD (gastroesophageal reflux disease) 01/29/2020  . History of Barrett's esophagus 01/29/2020  . Goiter 01/29/2020  . Neuropathy of both feet   . Lymphoma ([place]) 2020    Past Surgical History:  Procedure Laterality Date  . CERVICAL DISCECTOMY  1995   C-7  . CERVICAL FUSION  01/1989   C-3 through C-6 / no metal  . FOOT SURGERY Left 1999   Plantar fascitis  . low back fusion  2013   Lumbar & Low Thoracic with metal  . SHOULDER ARTHROSCOPY WITH ROTATOR CUFF REPAIR Right 2014       Home Medications    Prior to Admission medications   Medication Sig Start Date End Date Taking?  Authorizing Provider  amLODipine (NORVASC) 10 MG tablet Take 1 tablet (10 mg total) by mouth daily. 02/24/20   Inda Coke, PA  amoxicillin-clavulanate (AUGMENTIN) 875-125 MG tablet Take 1 tablet by mouth every 12 (twelve) hours for 7 days. 03/07/20 03/14/20  Markea Ruzich C, PA-C  atorvastatin (LIPITOR) 40 MG tablet Take 1 tablet (40 mg total) by mouth daily. 02/24/20   Inda Coke, PA  azithromycin (ZITHROMAX) 250 MG tablet Take first 2 tablets together, then 1 every day until finished. 03/07/20   Sheryl Saintil C, PA-C  benzonatate (TESSALON) 200 MG capsule Take 1 capsule (200 mg total) by mouth 3 (three) times daily as needed for up to 7 days for cough. 03/07/20 03/14/20  Laurice Iglesia C, PA-C  cetirizine (ZYRTEC) 10 MG tablet Take 10 mg by mouth daily.    [provider]  clindamycin (CLEOCIN-T) 1 % external solution Apply topically 2 (two) times daily. 02/05/20   Inda Coke, PA  esomeprazole (NEXIUM) 20 MG capsule Take 20 mg by mouth daily at 12 noon.    [provider]  fluticasone (FLONASE) 50 MCG/ACT nasal spray Place into both nostrils as needed for allergies or rhinitis.    [provider]  gabapentin (NEURONTIN) 300 MG capsule Take 1 capsule (300 mg total) by mouth 3 (three) times daily. 02/24/20  Inda Coke, PA  lisinopril (ZESTRIL) 5 MG tablet Take 1 tablet (5 mg total) by mouth daily. 02/24/20   Inda Coke, PA  predniSONE (DELTASONE) 50 MG tablet Take 1 tablet (50 mg total) by mouth daily with breakfast. 03/07/20   Recie Cirrincione C, PA-C  valACYclovir (VALTREX) 500 MG tablet Take 1 tablet (500 mg total) by mouth daily. 02/24/20   Inda Coke, PA    Family History Family History  Problem Relation Age of Onset  . Diabetes Mother   . Hypertension Mother   . Hyperlipidemia Mother   . Heart failure Mother   . Leukemia Father   . Stroke Father     Social History Social History   Tobacco Use  . Smoking status: Never  Smoker  . Smokeless tobacco: Never Used  Vaping Use  . Vaping Use: Never used  Substance Use Topics  . Alcohol use: Never  . Drug use: Never     Allergies   Patient has no known allergies.   Review of Systems Review of Systems  Constitutional: Positive for chills, fatigue and fever. Negative for activity change and appetite change.  HENT: Positive for congestion. Negative for ear pain, rhinorrhea, sinus pressure, sore throat and trouble swallowing.   Eyes: Negative for discharge and redness.  Respiratory: Positive for cough, shortness of breath and wheezing. Negative for chest tightness.   Cardiovascular: Negative for chest pain.  Gastrointestinal: Negative for abdominal pain, diarrhea, nausea and vomiting.  Musculoskeletal: Negative for myalgias.  Skin: Negative for rash.  Neurological: Negative for dizziness, light-headedness and headaches.     Physical Exam Triage Vital Signs ED Triage Vitals  Enc Vitals Group     BP 03/07/20 1747 110/78     Pulse Rate 03/07/20 1747 85     Resp 03/07/20 1747 20     Temp 03/07/20 1747 (!) 100.7 F (38.2 C)     Temp Source 03/07/20 1747 Oral     SpO2 03/07/20 1747 (!) 87 %     Weight --      Height --      Head Circumference --      Peak Flow --      Pain Score 03/07/20 1745 5     Pain Loc --      Pain Edu? --      Excl. in [place]? --    No data found.  Updated Vital Signs BP 110/78 (BP Location: Left Arm)   Pulse 80   Temp (!) 100.7 F (38.2 C) (Oral)   Resp 20   SpO2 (!) 89%   Visual Acuity Right Eye Distance:   Left Eye Distance:   Bilateral Distance:    Right Eye Near:   Left Eye Near:    Bilateral Near:     Physical Exam Vitals and nursing note reviewed.  Constitutional:      Appearance: He is well-developed.     Comments: No acute distress  HENT:     Head: Normocephalic and atraumatic.     Ears:     Comments: Bilateral ears without tenderness to palpation of external auricle, tragus and mastoid, EAC's  without erythema or swelling, TM's with good bony landmarks and cone of light. Non erythematous.     Nose: Nose normal.     Mouth/Throat:     Comments: Oral mucosa pink and moist, no tonsillar enlargement or exudate. Posterior pharynx patent and nonerythematous, no uvula deviation or swelling. Normal phonation. Eyes:     Conjunctiva/sclera: Conjunctivae normal.  Cardiovascular:     Rate and Rhythm: Normal rate.  Pulmonary:     Effort: Pulmonary effort is normal. No respiratory distress.     Comments: Voice is hoarse, appears slightly short of breath with slightly audible wheezing, breath sound slightly coarse with mild crackles throughout bilateral lung fields Abdominal:     General: There is no distension.  Musculoskeletal:        General: Normal range of motion.     Cervical back: Neck supple.  Skin:    General: Skin is warm and dry.  Neurological:     Mental Status: He is alert and oriented to person, place, and time.      UC Treatments / Results  Labs (all labs ordered are listed, but only abnormal results are displayed) Labs Reviewed - No data to display  EKG   Radiology DG Chest 2 View  Result Date: 03/07/2020 CLINICAL DATA:  Productive cough. EXAM: CHEST - 2 VIEW COMPARISON:  None. FINDINGS: Mild diffuse chronic appearing increased interstitial lung markings are seen. Very mild atelectasis and/or early infiltrate is seen within the retrocardiac region of the left lung base. There is no evidence of a pleural effusion or pneumothorax. The heart size and mediastinal contours are within normal limits. A radiopaque fusion plate and screws are seen overlying the lower cervical spine. The visualized skeletal structures are otherwise unremarkable. IMPRESSION: Chronic interstitial lung disease with very mild left basilar atelectasis and/or early infiltrate. Electronically Signed   By: Virgina Norfolk M.D.   On: 03/07/2020 19:08    Procedures Procedures (including critical care  time)  Medications Ordered in UC Medications  acetaminophen (TYLENOL) tablet 650 mg (650 mg Oral Given 03/07/20 1751)  albuterol (VENTOLIN HFA) 108 (90 Base) MCG/ACT inhaler 2 puff (2 puffs Inhalation Given 03/07/20 1856)  methylPREDNISolone sodium succinate (SOLU-MEDROL) 125 mg/2 mL injection 80 mg (80 mg Intramuscular Given 03/07/20 1856)    Initial Impression / Assessment and Plan / UC Course  I have reviewed the triage vital signs and the nursing notes.  Pertinent labs & imaging results that were available during my care of the patient were reviewed by me and considered in my medical decision making (see chart for details).  Clinical Course as of Mar 08 1221  Mon Mar 07, 2020  1921 89% with additional 2 puffs albuterol   [HW]    Clinical Course User Index [HW] Afomia Blackley, [place] C, PA-C    Chest x-ray with questionable early pneumonia, would not expect this to cause his hypoxia.  Provided albuterol inhaler puffs along with Solu-Medrol IM.  Patient clinically had improvement of his shortness of breath and wheezing, but continued to remain hypoxic around 88-90% at rest.  Discussed these concerns with patient and his wife, initial recommendations were to go to emergency room.  With discussions and given current weight in emergency room opted to initiate on antibiotic therapy overnight with continued close monitoring of symptoms.  Patient is to report to the emergency room in the morning if not seeing any improvement.  He plans to obtain a pulse ox from over-the-counter to check his O2 at home.  Stressed importance of this and further follow-up and evaluation if not improving at home.  Discussed strict return precautions. Patient verbalized understanding and is agreeable with plan.  Final Clinical Impressions(s) / UC Diagnoses   Final diagnoses:  Lower respiratory infection (e.g., bronchitis, pneumonia, pneumonitis, pulmonitis)     Discharge Instructions     Augmentin twice daily for 1  week Azithromycin- 2 tabs tonight, 1 tab following 4 days Continue inhaler as needed Prednisone daily Go to emergency room if oxygen not improving, worsening   ED Prescriptions    Medication Sig Dispense Auth. Provider   amoxicillin-clavulanate (AUGMENTIN) 875-125 MG tablet Take 1 tablet by mouth every 12 (twelve) hours for 7 days. 14 tablet Auriel Kist C, PA-C   azithromycin (ZITHROMAX) 250 MG tablet Take first 2 tablets together, then 1 every day until finished. 4 tablet Lucina Betty C, PA-C   benzonatate (TESSALON) 200 MG capsule Take 1 capsule (200 mg total) by mouth 3 (three) times daily as needed for up to 7 days for cough. 28 capsule Torre Pikus C, PA-C   predniSONE (DELTASONE) 50 MG tablet Take 1 tablet (50 mg total) by mouth daily with breakfast. 5 tablet Neilan Rizzo C, PA-C     PDMP not reviewed this encounter.   Joneen Caraway, [place] C, PA-C 03/08/20 1226    Akiyah Eppolito, [place] C, PA-C 03/08/20 1227

## 2020-03-08 NOTE — ED Triage Notes (Signed)
Reports being dx w/ PNA and bronchitis yesterday, states his oxygen level was low yesterday at the UC. Talking freely in triage. His PCP is concerned that he has a 'clot on my lung.'

## 2020-03-09 ENCOUNTER — Telehealth: Payer: Self-pay

## 2020-03-09 NOTE — Telephone Encounter (Signed)
Please see message and advise 

## 2020-03-09 NOTE — Telephone Encounter (Signed)
Spoke to pt told him per Aldona Bar, Per chart review patient did not stay at the emergency room and left before he was seen by a provider. Also  I see that he has still not been tested for COVID. Pt sad yes.  What are his oxygen levels running? Pt said he has not been checking the one he has does not work but it was 95 in the ED yesterday.  Told him If he is having any worsening breathing, the recommendation is to proceed to the ER. I cannot get a CT without getting more information and he likely needs to be evaluated to check his vitals.  Also I recommend COVID testing so we know what treatments are best for him. Told pt it would be best to go back to ED to be evaluated and have testing done. Pt verbalized understanding and said he will go back to Urgent care and have COVID testing done and oxygen levels checked he is feeling a little better then he was. Told pt okay but needs to be evaluated either ED or Urgent care. Pt verbalized understanding.

## 2020-03-09 NOTE — Telephone Encounter (Signed)
Per chart review patient did not stay at the emergency room and left before he was seen by a provider.  I see that he has still not been tested for COVID.  What are his oxygen levels running?  If he is having any worsening breathing, the recommendation is to proceed to the ER. I cannot get a CT without getting more information and he likely needs to be evaluated to check his vitals.  Also I recommend COVID testing so we know what treatments are best for him.  I will not be in the office until Friday and he needs to be evaluated before then.

## 2020-03-09 NOTE — Telephone Encounter (Signed)
Patient states he was seen in the ED yesterday for low oxygen levels.  States he has bronchitis and pneumonia.   Would like to know if he still needs to get CT and when he needs to follow back up with [facility].

## 2020-03-10 ENCOUNTER — Encounter (HOSPITAL_COMMUNITY): Payer: Self-pay

## 2020-03-10 ENCOUNTER — Emergency Department (HOSPITAL_COMMUNITY): Payer: Medicare Other

## 2020-03-10 ENCOUNTER — Emergency Department (HOSPITAL_COMMUNITY)
Admission: EM | Admit: 2020-03-10 | Discharge: 2020-03-10 | Disposition: A | Discharge status: Home / Self Care | Payer: Medicare Other | Attending: Emergency Medicine | Admitting: Emergency Medicine

## 2020-03-10 ENCOUNTER — Other Ambulatory Visit: Payer: Self-pay

## 2020-03-10 DIAGNOSIS — Z20822 Contact with and (suspected) exposure to covid-19: Secondary | ICD-10-CM | POA: Insufficient documentation

## 2020-03-10 DIAGNOSIS — R0602 Shortness of breath: Secondary | ICD-10-CM | POA: Diagnosis not present

## 2020-03-10 DIAGNOSIS — R059 Cough, unspecified: Secondary | ICD-10-CM | POA: Diagnosis not present

## 2020-03-10 DIAGNOSIS — J40 Bronchitis, not specified as acute or chronic: Secondary | ICD-10-CM

## 2020-03-10 DIAGNOSIS — Z79899 Other long term (current) drug therapy: Secondary | ICD-10-CM | POA: Diagnosis not present

## 2020-03-10 DIAGNOSIS — I10 Essential (primary) hypertension: Secondary | ICD-10-CM | POA: Insufficient documentation

## 2020-03-10 LAB — CBC WITH DIFFERENTIAL/PLATELET
Abs Immature Granulocytes: 0.42 10*3/uL — ABNORMAL HIGH (ref 0.00–0.07)
Basophils Absolute: 0.1 10*3/uL (ref 0.0–0.1)
Basophils Relative: 0 %
Eosinophils Absolute: 0 10*3/uL (ref 0.0–0.5)
Eosinophils Relative: 0 %
HCT: 40.8 % (ref 39.0–52.0)
Hemoglobin: 13.6 g/dL (ref 13.0–17.0)
Immature Granulocytes: 3 %
Lymphocytes Relative: 10 %
Lymphs Abs: 1.4 10*3/uL (ref 0.7–4.0)
MCH: 31.2 pg (ref 26.0–34.0)
MCHC: 33.3 g/dL (ref 30.0–36.0)
MCV: 93.6 fL (ref 80.0–100.0)
Monocytes Absolute: 1.4 10*3/uL — ABNORMAL HIGH (ref 0.1–1.0)
Monocytes Relative: 10 %
Neutro Abs: 11.2 10*3/uL — ABNORMAL HIGH (ref 1.7–7.7)
Neutrophils Relative %: 77 %
Platelets: 294 10*3/uL (ref 150–400)
RBC: 4.36 MIL/uL (ref 4.22–5.81)
RDW: 13.1 % (ref 11.5–15.5)
WBC: 14.6 10*3/uL — ABNORMAL HIGH (ref 4.0–10.5)
nRBC: 0 % (ref 0.0–0.2)

## 2020-03-10 LAB — COMPREHENSIVE METABOLIC PANEL
ALT: 51 U/L — ABNORMAL HIGH (ref 0–44)
AST: 53 U/L — ABNORMAL HIGH (ref 15–41)
Albumin: 3.6 g/dL (ref 3.5–5.0)
Alkaline Phosphatase: 70 U/L (ref 38–126)
Anion gap: 9 (ref 5–15)
BUN: 28 mg/dL — ABNORMAL HIGH (ref 8–23)
CO2: 26 mmol/L (ref 22–32)
Calcium: 10.2 mg/dL (ref 8.9–10.3)
Chloride: 105 mmol/L (ref 98–111)
Creatinine, Ser: 0.94 mg/dL (ref 0.61–1.24)
GFR, Estimated: >60 mL/min (ref >60)
Glucose, Bld: 112 mg/dL — ABNORMAL HIGH (ref 70–99)
Potassium: 3.3 mmol/L — ABNORMAL LOW (ref 3.5–5.1)
Sodium: 140 mmol/L (ref 135–145)
Total Bilirubin: 0.5 mg/dL (ref 0.3–1.2)
Total Protein: 7.1 g/dL (ref 6.5–8.1)

## 2020-03-10 LAB — RESP PANEL BY RT-PCR (FLU A&B, COVID) ARPGX2
Influenza A by PCR: NEGATIVE
Influenza B by PCR: NEGATIVE
SARS Coronavirus 2 by RT PCR: NEGATIVE

## 2020-03-10 NOTE — ED Provider Notes (Signed)
[facility] DEPT Provider Note   CSN: [id number] Arrival date & time: 03/10/20  3532     History Chief Complaint  Patient presents with  . Cough    Taylor Berger is a 74 y.o. male.  74 year old male presents with cough and shortness of breath.  Diagnosed with bronchitis and pneumonia recently.  Has been on steroids, albuterol inhaler, antibiotics and feels like his symptoms are improving.  Does have a prior history of DVT but denies a history of PE.  No recent leg pain or swelling.  Few days ago did have low O2 sats but have since improved here without oxygen.  Patient is fully vaccinated against Covid including a booster.  States that he has been using inhaler with improvement of symptoms.  Notes he has increased coughing after he does this.  Overall states that his symptoms have been improving.        Past Medical History:  Diagnosis Date  . Allergy    Seasonal  . Aortic aneurysm ([place])   . Depression   . DVT (deep venous thrombosis) ([place]) 2020  . GERD (gastroesophageal reflux disease)   . Goiter   . Hyperlipidemia   . Hypertension   . Lymphoma ([place]) 9924   Follicular, stomach  . Neuropathy of both feet     Patient Active Problem List   Diagnosis Date Noted  . Follicular lymphoma grade I of intra-abdominal lymph nodes ([place]) 03/07/2020  . Hypertension 01/29/2020  . Thoracic aortic aneurysm ([place]) 01/29/2020  . DVT R femoral thigh 01/29/2020  . GERD (gastroesophageal reflux disease) 01/29/2020  . History of Barrett's esophagus 01/29/2020  . Goiter 01/29/2020  . Neuropathy of both feet   . Lymphoma ([place]) 2020    Past Surgical History:  Procedure Laterality Date  . CERVICAL DISCECTOMY  1995   C-7  . CERVICAL FUSION  01/1989   C-3 through C-6 / no metal  . FOOT SURGERY Left 1999   Plantar fascitis  . low back fusion  2013   Lumbar & Low Thoracic with metal  . SHOULDER ARTHROSCOPY WITH ROTATOR CUFF REPAIR Right 2014       Family  History  Problem Relation Age of Onset  . Diabetes Mother   . Hypertension Mother   . Hyperlipidemia Mother   . Heart failure Mother   . Leukemia Father   . Stroke Father     Social History   Tobacco Use  . Smoking status: Never Smoker  . Smokeless tobacco: Never Used  Vaping Use  . Vaping Use: Never used  Substance Use Topics  . Alcohol use: Never  . Drug use: Never    Home Medications Prior to Admission medications   Medication Sig Start Date End Date Taking? Authorizing Provider  amLODipine (NORVASC) 10 MG tablet Take 1 tablet (10 mg total) by mouth daily. 02/24/20   Inda Coke, PA  amoxicillin-clavulanate (AUGMENTIN) 875-125 MG tablet Take 1 tablet by mouth every 12 (twelve) hours for 7 days. 03/07/20 03/14/20  Wieters, Hallie C, PA-C  atorvastatin (LIPITOR) 40 MG tablet Take 1 tablet (40 mg total) by mouth daily. 02/24/20   Inda Coke, PA  azithromycin (ZITHROMAX) 250 MG tablet Take first 2 tablets together, then 1 every day until finished. 03/07/20   Wieters, Hallie C, PA-C  benzonatate (TESSALON) 200 MG capsule Take 1 capsule (200 mg total) by mouth 3 (three) times daily as needed for up to 7 days for cough. 03/07/20 03/14/20  Wieters, Elesa Hacker, PA-C  cetirizine (ZYRTEC) 10 MG tablet Take 10 mg by mouth daily.    [provider]  clindamycin (CLEOCIN-T) 1 % external solution Apply topically 2 (two) times daily. 02/05/20   Inda Coke, PA  esomeprazole (NEXIUM) 20 MG capsule Take 20 mg by mouth daily at 12 noon.    [provider]  fluticasone (FLONASE) 50 MCG/ACT nasal spray Place into both nostrils as needed for allergies or rhinitis.    [provider]  gabapentin (NEURONTIN) 300 MG capsule Take 1 capsule (300 mg total) by mouth 3 (three) times daily. 02/24/20   Inda Coke, PA  lisinopril (ZESTRIL) 5 MG tablet Take 1 tablet (5 mg total) by mouth daily. 02/24/20   Inda Coke, PA  predniSONE (DELTASONE) 50 MG tablet Take 1  tablet (50 mg total) by mouth daily with breakfast. 03/07/20   Wieters, Hallie C, PA-C  valACYclovir (VALTREX) 500 MG tablet Take 1 tablet (500 mg total) by mouth daily. 02/24/20   Inda Coke, PA    Allergies    Patient has no known allergies.  Review of Systems   Review of Systems  All other systems reviewed and are negative.   Physical Exam Updated Vital Signs BP 138/81   Pulse (!) 50   Temp 97.8 F (36.6 C) (Oral)   Resp 18   Ht 1.727 m (5\' 8" )   Wt 90.7 kg   SpO2 100%   BMI 30.41 kg/m   Physical Exam Vitals and nursing note reviewed.  Constitutional:      General: He is not in acute distress.    Appearance: Normal appearance. He is well-developed and well-nourished. He is not toxic-appearing.  HENT:     Head: Normocephalic and atraumatic.  Eyes:     General: Lids are normal.     Extraocular Movements: EOM normal.     Conjunctiva/sclera: Conjunctivae normal.     Pupils: Pupils are equal, round, and reactive to light.  Neck:     Thyroid: No thyroid mass.     Trachea: No tracheal deviation.  Cardiovascular:     Rate and Rhythm: Normal rate and regular rhythm.     Heart sounds: Normal heart sounds. No murmur heard. No gallop.   Pulmonary:     Effort: Pulmonary effort is normal. No respiratory distress.     Breath sounds: No stridor. Examination of the right-upper field reveals decreased breath sounds. Examination of the left-upper field reveals decreased breath sounds. Decreased breath sounds present. No wheezing, rhonchi or rales.  Abdominal:     General: Bowel sounds are normal. There is no distension.     Palpations: Abdomen is soft.     Tenderness: There is no abdominal tenderness. There is no CVA tenderness or rebound.  Musculoskeletal:        General: No tenderness or edema. Normal range of motion.     Cervical back: Normal range of motion and neck supple.  Skin:    General: Skin is warm and dry.     Findings: No abrasion or rash.  Neurological:      Mental Status: He is alert and oriented to person, place, and time.     GCS: GCS eye subscore is 4. GCS verbal subscore is 5. GCS motor subscore is 6.     Cranial Nerves: No cranial nerve deficit.     Sensory: No sensory deficit.     Deep Tendon Reflexes: Strength normal.  Psychiatric:        Mood and Affect: Mood and  affect normal.        Speech: Speech normal.        Behavior: Behavior normal.     ED Results / Procedures / Treatments   Labs (all labs ordered are listed, but only abnormal results are displayed) Labs Reviewed  RESP PANEL BY RT-PCR (FLU A&B, COVID) [id number]  COMPREHENSIVE METABOLIC PANEL  CBC WITH DIFFERENTIAL/PLATELET    EKG EKG Interpretation  Date/Time:  Thursday March 10 2020 10:30:12 EST Ventricular Rate:  48 PR Interval:    QRS Duration: 111 QT Interval:  425 QTC Calculation: 380 R Axis:   -30 Text Interpretation: Sinus bradycardia Atrial premature complex Prolonged PR interval Abnormal R-wave progression, early transition Left ventricular hypertrophy No old tracing to compare Confirmed by Lacretia Leigh ([id number]) on 03/10/2020 10:39:34 AM   Radiology No results found.  Procedures Procedures (including critical care time)  Medications Ordered in ED Medications - No data to display  ED Course  I have reviewed the triage vital signs and the nursing notes.  Pertinent labs & imaging results that were available during my care of the patient were reviewed by me and considered in my medical decision making (see chart for details).    MDM Rules/Calculators/A&P                          Patient with negative Covid test here.  Chest x-ray without acute findings.  SPECT that he has no bronchitis and he will continue his current therapy as he is already getting better. Final Clinical Impression(s) / ED Diagnoses Final diagnoses:  None    Rx / DC Orders ED Discharge Orders    None       Lacretia Leigh, MD 03/10/20 1241

## 2020-03-10 NOTE — ED Notes (Signed)
Pt to xray via stretcher

## 2020-03-10 NOTE — ED Triage Notes (Addendum)
Patient states on 03/07/20 he went to an UC and was diagnosed with bronchitis and pneumonia. Patient states his O2 Sats were 86% at the time Sats in triage 95%.  Today, the patient continues to cough and states on 12/7 he was in the ED to r/o a blood clot.Patient states he could not lay flat to get the scan done, but is able to do the CT scan today.Paitenat also says his chest is sore from coughing so much.

## 2020-03-10 NOTE — ED Notes (Signed)
Pt returned from xray

## 2020-03-14 ENCOUNTER — Encounter: Payer: Self-pay | Admitting: Hematology and Oncology

## 2020-03-14 ENCOUNTER — Inpatient Hospital Stay: Payer: Medicare Other

## 2020-03-14 ENCOUNTER — Other Ambulatory Visit: Payer: Self-pay

## 2020-03-14 ENCOUNTER — Inpatient Hospital Stay: Payer: Medicare Other | Attending: Hematology and Oncology | Admitting: Hematology and Oncology

## 2020-03-14 VITALS — BP 122/80 | HR 87 | Temp 98.8°F | Resp 19 | Ht 68.0 in | Wt 197.7 lb

## 2020-03-14 DIAGNOSIS — I10 Essential (primary) hypertension: Secondary | ICD-10-CM | POA: Insufficient documentation

## 2020-03-14 DIAGNOSIS — Z86718 Personal history of other venous thrombosis and embolism: Secondary | ICD-10-CM | POA: Diagnosis not present

## 2020-03-14 DIAGNOSIS — C8203 Follicular lymphoma grade I, intra-abdominal lymph nodes: Secondary | ICD-10-CM | POA: Diagnosis present

## 2020-03-14 DIAGNOSIS — I82411 Acute embolism and thrombosis of right femoral vein: Secondary | ICD-10-CM

## 2020-03-14 LAB — CMP (CANCER CENTER ONLY)
ALT: 43 U/L (ref 0–44)
AST: 28 U/L (ref 15–41)
Albumin: 3.2 g/dL — ABNORMAL LOW (ref 3.5–5.0)
Alkaline Phosphatase: 70 U/L (ref 38–126)
Anion gap: 7 (ref 5–15)
BUN: 17 mg/dL (ref 8–23)
CO2: 32 mmol/L (ref 22–32)
Calcium: 9.6 mg/dL (ref 8.9–10.3)
Chloride: 102 mmol/L (ref 98–111)
Creatinine: 1.02 mg/dL (ref 0.61–1.24)
GFR, Estimated: >60 mL/min (ref >60)
Glucose, Bld: 131 mg/dL — ABNORMAL HIGH (ref 70–99)
Potassium: 3.7 mmol/L (ref 3.5–5.1)
Sodium: 141 mmol/L (ref 135–145)
Total Bilirubin: 1 mg/dL (ref 0.3–1.2)
Total Protein: 6.7 g/dL (ref 6.5–8.1)

## 2020-03-14 LAB — CBC WITH DIFFERENTIAL (CANCER CENTER ONLY)
Abs Immature Granulocytes: 1.49 10*3/uL — ABNORMAL HIGH (ref 0.00–0.07)
Basophils Absolute: 0.1 10*3/uL (ref 0.0–0.1)
Basophils Relative: 1 %
Eosinophils Absolute: 0.4 10*3/uL (ref 0.0–0.5)
Eosinophils Relative: 3 %
HCT: 43.4 % (ref 39.0–52.0)
Hemoglobin: 14.6 g/dL (ref 13.0–17.0)
Immature Granulocytes: 12 %
Lymphocytes Relative: 16 %
Lymphs Abs: 2 10*3/uL (ref 0.7–4.0)
MCH: 31.5 pg (ref 26.0–34.0)
MCHC: 33.6 g/dL (ref 30.0–36.0)
MCV: 93.7 fL (ref 80.0–100.0)
Monocytes Absolute: 1.1 10*3/uL — ABNORMAL HIGH (ref 0.1–1.0)
Monocytes Relative: 9 %
Neutro Abs: 7.3 10*3/uL (ref 1.7–7.7)
Neutrophils Relative %: 59 %
Platelet Count: 332 10*3/uL (ref 150–400)
RBC: 4.63 MIL/uL (ref 4.22–5.81)
RDW: 13.2 % (ref 11.5–15.5)
WBC Count: 12.5 10*3/uL — ABNORMAL HIGH (ref 4.0–10.5)
nRBC: 0 % (ref 0.0–0.2)

## 2020-03-14 LAB — LACTATE DEHYDROGENASE: LDH: 242 U/L — ABNORMAL HIGH (ref 98–192)

## 2020-03-14 NOTE — Progress Notes (Signed)
[facility] [phone]   Fax:([phone] [place] NOTE  Patient Care Team: Taylor Berger, [place] as PCP - General (Physician Assistant)  Hematological/Oncological History # Follicular Lymphoma Stage I Grade IA 1) 05/20/2019: CT scan showed stable mesenteric nodule, largest 4.2 cm and mild periaortic lymphadenopathy.  2) 06/04/2019: biopsy of abdominal lymph node showed grade 1 follicular lymphoma. Observation recommended by his oncologist at [facility] in [place], [place].  3) 03/14/2020: establish care with Dr. Lorenso Berger   CHIEF COMPLAINTS/PURPOSE OF CONSULTATION:  "Follicular Lymphoma"  HISTORY OF PRESENTING ILLNESS:  Taylor Berger 74 y.o. male with medical history significant for HTN, HLD, DVT ,GERD, and follicular lymphoma (diagnosed 06/2019 and under observation) presents to establish care with oncology for his follicular lymphoma.   On review of the previous records [name] is currently followed by [facility] cancer specialists and [place],  [place].  Reportedly has history of a mildly hypermetabolic mesenteric lymph node in his mid abdomen that has increased in size since 2013.  On 06/04/2019 the patient underwent a biopsy which showed a low-grade follicular lymphoma grade 1.  Observation was recommended at that time.  He was last seen in their clinic on 09/09/2019 prior to his move to [place] in July 2021.   On exam today [name] notes that his follicular lymphoma was initially diagnosed after he was undergoing work-up for a DVT approximately 1 year ago.  He notes that he underwent a CT scan which showed lymphadenopathy in the stomach as well as a subsequent PET CT scan which confirmed FDG avidity.  He had a needle biopsy of this lesion which confirmed the diagnosis of follicular lymphoma.  He was initially told that active surveillance would be appropriate course of action and he sought a second opinion which confirmed  that plan.  He was agreeable to continued monitoring at his hospital in [place] but recently moved up here to [place] to be closer to his family and wishes to establish with a local oncologist.  He reports that he has been well since his diagnosis.  He has not had any fevers, chills, sweats, nausea, vomiting or diarrhea.  He denies having any issues with lymphadenopathy.  He is currently having a case of bronchitis which was diagnosed by his PCP.  He notes he was tested for Covid and was found to be negative.  On further discussion his past medical history is significant for GERD, and a goiter.  He is a never smoker and previously worked for [employer].  His family history is unremarkable.  On further discussion he notes that the DVT "came out of the blue".  It was not associated with any prolonged immobility, prolonged travel, or surgical procedure.  He was treated with anticoagulant therapy for 6 months duration and then treatment had been stopped.  He notes no signs or symptoms of recurrent DVT at this time.  MEDICAL HISTORY:  Past Medical History:  Diagnosis Date  . Allergy    Seasonal  . Aortic aneurysm ([place])   . Depression   . DVT (deep venous thrombosis) ([place]) 2020  . GERD (gastroesophageal reflux disease)   . Goiter   . Hyperlipidemia   . Hypertension   . Lymphoma ([place]) 1505   Follicular, stomach  . Neuropathy of both feet     SURGICAL HISTORY: Past Surgical History:  Procedure Laterality Date  . CERVICAL DISCECTOMY  1995   C-7  . CERVICAL FUSION  01/1989  C-3 through C-6 / no metal  . FOOT SURGERY Left 1999   Plantar fascitis  . low back fusion  2013   Lumbar & Low Thoracic with metal  . SHOULDER ARTHROSCOPY WITH ROTATOR CUFF REPAIR Right 2014    SOCIAL HISTORY: Social History   Socioeconomic History  . Marital status: Married    Spouse name: Not on file  . Number of children: Not on file  . Years of education: Not on file  . Highest  education level: Not on file  Occupational History  . Not on file  Tobacco Use  . Smoking status: Never Smoker  . Smokeless tobacco: Never Used  Vaping Use  . Vaping Use: Never used  Substance and Sexual Activity  . Alcohol use: Never  . Drug use: Never  . Sexual activity: Not Currently  Other Topics Concern  . Not on file  Social History Narrative   Moved from [facility] to be closer to son   Married   Social Determinants of Radio broadcast assistant Strain: Not on file  Food Insecurity: Not on file  Transportation Needs: Not on file  Physical Activity: Not on file  Stress: Not on file  Social Connections: Not on file  Intimate Partner Violence: Not on file    FAMILY HISTORY: Family History  Problem Relation Age of Onset  . Diabetes Mother   . Hypertension Mother   . Hyperlipidemia Mother   . Heart failure Mother   . Leukemia Father   . Stroke Father     ALLERGIES:  has No Known Allergies.  MEDICATIONS:  Current Outpatient Medications  Medication Sig Dispense Refill  . amLODipine (NORVASC) 10 MG tablet Take 1 tablet (10 mg total) by mouth daily. 90 tablet 1  . atorvastatin (LIPITOR) 40 MG tablet Take 1 tablet (40 mg total) by mouth daily. 90 tablet 1  . benzonatate (TESSALON) 200 MG capsule Take 1 capsule (200 mg total) by mouth 3 (three) times daily as needed for up to 7 days for cough. 28 capsule 0  . cetirizine (ZYRTEC) 10 MG tablet Take 10 mg by mouth daily.    . clindamycin (CLEOCIN-T) 1 % external solution Apply topically 2 (two) times daily. 30 mL 0  . esomeprazole (NEXIUM) 20 MG capsule Take 20 mg by mouth daily at 12 noon.    . fluticasone (FLONASE) 50 MCG/ACT nasal spray Place into both nostrils as needed for allergies or rhinitis.    Marland Kitchen gabapentin (NEURONTIN) 300 MG capsule Take 1 capsule (300 mg total) by mouth 3 (three) times daily. 270 capsule 1  . lisinopril (ZESTRIL) 5 MG tablet Take 1 tablet (5 mg total) by mouth daily. 90 tablet 1  . valACYclovir  (VALTREX) 500 MG tablet Take 1 tablet (500 mg total) by mouth daily. 90 tablet 1   No current facility-administered medications for this visit.    REVIEW OF SYSTEMS:   Constitutional: ( - ) fevers, ( - )  chills , ( - ) night sweats Eyes: ( - ) blurriness of vision, ( - ) double vision, ( - ) watery eyes Ears, nose, mouth, throat, and face: ( - ) mucositis, ( - ) sore throat Respiratory: ( - ) cough, ( - ) dyspnea, ( - ) wheezes Cardiovascular: ( - ) palpitation, ( - ) chest discomfort, ( - ) lower extremity swelling Gastrointestinal:  ( - ) nausea, ( - ) heartburn, ( - ) change in bowel habits Skin: ( - ) abnormal skin  rashes Lymphatics: ( - ) new lymphadenopathy, ( - ) easy bruising Neurological: ( - ) numbness, ( - ) tingling, ( - ) new weaknesses Behavioral/Psych: ( - ) mood change, ( - ) new changes  All other systems were reviewed with the patient and are negative.  PHYSICAL EXAMINATION: ECOG PERFORMANCE STATUS: 0 - Asymptomatic  Vitals:   03/14/20 1333  BP: 122/80  Pulse: 87  Resp: 19  Temp: 98.8 F (37.1 C)  SpO2: 95%   Filed Weights   03/14/20 1333  Weight: 197 lb 11.2 oz (89.7 kg)    GENERAL: well appearing elderly Caucasian male, appears younger than stated age, in NAD  SKIN: skin color, texture, turgor are normal, no rashes or significant lesions EYES: conjunctiva are pink and non-injected, sclera clear OROPHARYNX: no exudate, no erythema; lips, buccal mucosa, and tongue normal  NECK: supple, non-tender. Palpable goiter LYMPH:  no palpable lymphadenopathy in the cervical, axillary or supraclavicular lymph nodes.  LUNGS: clear to auscultation and percussion with normal breathing effort HEART: regular rate & rhythm and no murmurs and no lower extremity edema Musculoskeletal: no cyanosis of digits and no clubbing  PSYCH: alert & oriented x 3, fluent speech NEURO: no focal motor/sensory deficits  LABORATORY DATA:  I have reviewed the data as listed CBC Latest  Ref Rng & Units 03/14/2020 03/10/2020 04/22/2019  WBC 4.0 - 10.5 K/uL 12.5(H) 14.6(H) 6.3  Hemoglobin 13.0 - 17.0 g/dL 14.6 13.6 13.0(A)  Hematocrit 39.0 - 52.0 % 43.4 40.8 40(A)  Platelets 150 - 400 K/uL 332 294 239    CMP Latest Ref Rng & Units 03/14/2020 03/10/2020 02/24/2020  Glucose 70 - 99 mg/dL 131(H) 112(H) 86  BUN 8 - 23 mg/dL 17 28(H) 16  Creatinine 0.61 - 1.24 mg/dL 1.02 0.94 0.86  Sodium 135 - 145 mmol/L 141 140 144  Potassium 3.5 - 5.1 mmol/L 3.7 3.3(L) 4.5  Chloride 98 - 111 mmol/L 102 105 106  CO2 22 - 32 mmol/L 32 26 28  Calcium 8.9 - 10.3 mg/dL 9.6 10.2 9.6  Total Protein 6.5 - 8.1 g/dL 6.7 7.1 -  Total Bilirubin 0.3 - 1.2 mg/dL 1.0 0.5 -  Alkaline Phos 38 - 126 U/L 70 70 -  AST 15 - 41 U/L 28 53(H) -  ALT 0 - 44 U/L 43 51(H) -     PATHOLOGY:     RADIOGRAPHIC STUDIES: I have personally reviewed the radiological images as listed and agreed with the findings in the report. DG Chest 2 View  Result Date: 03/10/2020 CLINICAL DATA:  Shortness of breath EXAM: CHEST - 2 VIEW COMPARISON:  03/07/2020 FINDINGS: The cardiac silhouette, mediastinal and hilar contours are normal. There is mild tortuosity and calcification of the thoracic aorta. The lungs are clear of an acute process. No pleural effusions or pulmonary lesions. The bony thorax is intact. Anterior and interbody cervical fusion hardware noted. IMPRESSION: No acute cardiopulmonary findings. Electronically Signed   By: Marijo Sanes M.D.   On: 03/10/2020 11:44   DG Chest 2 View  Result Date: 03/07/2020 CLINICAL DATA:  Productive cough. EXAM: CHEST - 2 VIEW COMPARISON:  None. FINDINGS: Mild diffuse chronic appearing increased interstitial lung markings are seen. Very mild atelectasis and/or early infiltrate is seen within the retrocardiac region of the left lung base. There is no evidence of a pleural effusion or pneumothorax. The heart size and mediastinal contours are within normal limits. A radiopaque fusion plate  and screws are seen overlying the lower cervical spine.  The visualized skeletal structures are otherwise unremarkable. IMPRESSION: Chronic interstitial lung disease with very mild left basilar atelectasis and/or early infiltrate. Electronically Signed   By: Virgina Norfolk M.D.   On: 03/07/2020 19:08    ASSESSMENT & PLAN Param Capri 74 y.o. male with medical history significant for HTN, HLD, DVT ,GERD, and follicular lymphoma (diagnosed 06/2019 and under observation) presents to establish care with oncology for his follicular lymphoma.  After review the labs, review the records, discussion with the patient the findings are most consistent with a low-grade stage I follicular lymphoma of the lymph node of the abdomen.  At this time there is no indication for treatment as there is no bulky disease, no B symptoms, and no cytopenias.  We can continue to follow this patient in clinic with lab work and clinical follow-up.  Typically there is no need for routine surveillance imaging and follicular lymphoma however given that we have no scan on record here and he receives periodic CT scans.  His aortic aneurysm I would recommend that his neck scan include imaging of his abdomen for a baseline in our system as well as to to assure that the lymph node is not increasing markedly in size.  The patient was agreeable to this plan and was willing and able to see [place] back in 6 months time.  #Grade IA Follicular Lymphoma --diagnosed in [place] in March 2021. Biopsy confirms Grade IA follicular lymphoma with a Ki-67 of 5-10%. --Given the low-grade of this lymphoma I do believe that the plan for continued active surveillance of his disease is appropriate.  We will plan to see the patient back in approximately 6 months time for close monitoring and lab checks. --For follicular lymphoma there is no need for routine imaging, however we have no imaging on baseline and the patient does receive periodic CT scans for his aortic  aneurysm.  I would recommend that during his next aortic aneurysm scan he receive a full scan including his abdomen in order to give [place] a baseline of this lymphadenopathy and to assure that has not increased in size since his last visit. --Return to clinic in 6 months time.  #Right Lower Extremity DVT --Diagnosed approximately 1 year ago and treated with 6 months of anticoagulation therapy. --From the patient's description this sounds like an unprovoked DVT. Discussed with the patient the risks of recurrent VTE and the recommendation for indefinite anticoagulation. He notes he would like to continue observation at this time for recurrent clot.  Orders Placed This Encounter  Procedures  . CBC with Differential (Cancer Center Only)    Standing Status:   Future    Number of Occurrences:   1    Standing Expiration Date:   03/14/2021  . CMP ([facility] only)    Standing Status:   Future    Number of Occurrences:   1    Standing Expiration Date:   03/14/2021  . Lactate dehydrogenase (LDH)    Standing Status:   Future    Number of Occurrences:   1    Standing Expiration Date:   03/14/2021    All questions were answered. The patient knows to call the clinic with any problems, questions or concerns.  A total of more than 60 minutes were spent on this encounter and over half of that time was spent on counseling and coordination of care as outlined above.   Ledell Peoples, MD Department of Hematology/Oncology [place] [facility]  Phone: [phone] Pager: [phone] Email: [email or web address]  03/14/2020 6:31 PM

## 2020-03-15 ENCOUNTER — Telehealth: Payer: Self-pay | Admitting: Hematology and Oncology

## 2020-03-15 NOTE — Telephone Encounter (Signed)
Scheduled per los. Called and left msg. Mailed printout  °

## 2020-03-19 ENCOUNTER — Other Ambulatory Visit: Payer: Self-pay

## 2020-03-19 ENCOUNTER — Ambulatory Visit
Admission: RE | Admit: 2020-03-19 | Discharge: 2020-03-19 | Disposition: A | Discharge status: Home / Self Care | Payer: Medicare Other | Source: Ambulatory Visit | Attending: Urgent Care | Admitting: Urgent Care

## 2020-03-19 ENCOUNTER — Encounter: Payer: Self-pay | Admitting: Hematology and Oncology

## 2020-03-19 VITALS — BP 109/66 | HR 58 | Temp 97.6°F | Resp 20

## 2020-03-19 DIAGNOSIS — J209 Acute bronchitis, unspecified: Secondary | ICD-10-CM

## 2020-03-19 DIAGNOSIS — Z86718 Personal history of other venous thrombosis and embolism: Secondary | ICD-10-CM

## 2020-03-19 DIAGNOSIS — R9389 Abnormal findings on diagnostic imaging of other specified body structures: Secondary | ICD-10-CM

## 2020-03-19 MED ORDER — PREDNISONE 10 MG PO TABS: 30.0000 mg | ORAL_TABLET | Freq: Every day | ORAL | 0 refills | Status: DC

## 2020-03-19 MED ORDER — ALBUTEROL SULFATE HFA 108 (90 BASE) MCG/ACT IN AERS
1.0000 | INHALATION_SPRAY | Freq: Four times a day (QID) | RESPIRATORY_TRACT | 0 refills | Status: DC | PRN
Start: 2020-03-19 — End: 2020-06-01

## 2020-03-19 MED ORDER — BENZONATATE 100 MG PO CAPS
100.0000 mg | ORAL_CAPSULE | Freq: Three times a day (TID) | ORAL | 0 refills | Status: DC | PRN
Start: 2020-03-19 — End: 2020-04-12

## 2020-03-19 MED ORDER — PROMETHAZINE-DM 6.25-15 MG/5ML PO SYRP: 5.0000 mL | ORAL_SOLUTION | Freq: Every evening | ORAL | 0 refills | Status: DC | PRN

## 2020-03-19 NOTE — ED Provider Notes (Signed)
[place]   MRN: [id number] DOB: 1945-11-27  Subjective:   Taylor Berger is a 74 y.o. male presenting for recheck on cough and hoarseness.  Patient states that he has improved and his coughing is mild and intermittent ill, still has a mild sore throat and is not hoarse.  He has undergone a course of Augmentin, azithromycin and received IM Solu-Medrol at his visit here with [place] the beginning of December.  He went to the emergency room on 12/09 and had a recheck, chest x-ray was negative then.  The initial x-ray on 12/06 showed mild interstitial changes, early pneumonia.  This was treated as above.  He does have an inhaler and needs a refill on this.  Denies active chest pain, shortness of breath.  He does have a history of follicular lymphoma which is being monitored, thoracic aortic aneurysm.  His pulse oximetry has improved some.  Initially was in the high 80s but is now at the 93% today. Has a history of DVT ~1.5 years ago. He is not on any anticoagulation. Stopped taking Eliquis after 90 days as directed by his doctor.  No current facility-administered medications for this encounter.  Current Outpatient Medications:  .  amLODipine (NORVASC) 10 MG tablet, Take 1 tablet (10 mg total) by mouth daily., Disp: 90 tablet, Rfl: 1 .  atorvastatin (LIPITOR) 40 MG tablet, Take 1 tablet (40 mg total) by mouth daily., Disp: 90 tablet, Rfl: 1 .  cetirizine (ZYRTEC) 10 MG tablet, Take 10 mg by mouth daily., Disp: , Rfl:  .  clindamycin (CLEOCIN-T) 1 % external solution, Apply topically 2 (two) times daily., Disp: 30 mL, Rfl: 0 .  esomeprazole (NEXIUM) 20 MG capsule, Take 20 mg by mouth daily at 12 noon., Disp: , Rfl:  .  fluticasone (FLONASE) 50 MCG/ACT nasal spray, Place into both nostrils as needed for allergies or rhinitis., Disp: , Rfl:  .  gabapentin (NEURONTIN) 300 MG capsule, Take 1 capsule (300 mg total) by mouth 3 (three) times daily., Disp: 270 capsule, Rfl: 1 .  lisinopril (ZESTRIL)  5 MG tablet, Take 1 tablet (5 mg total) by mouth daily., Disp: 90 tablet, Rfl: 1 .  valACYclovir (VALTREX) 500 MG tablet, Take 1 tablet (500 mg total) by mouth daily., Disp: 90 tablet, Rfl: 1   No Known Allergies  Past Medical History:  Diagnosis Date  . Allergy    Seasonal  . Aortic aneurysm ([place])   . Depression   . DVT (deep venous thrombosis) ([place]) 2020  . GERD (gastroesophageal reflux disease)   . Goiter   . Hyperlipidemia   . Hypertension   . Lymphoma ([place]) 8315   Follicular, stomach  . Neuropathy of both feet      Past Surgical History:  Procedure Laterality Date  . CERVICAL DISCECTOMY  1995   C-7  . CERVICAL FUSION  01/1989   C-3 through C-6 / no metal  . FOOT SURGERY Left 1999   Plantar fascitis  . low back fusion  2013   Lumbar & Low Thoracic with metal  . SHOULDER ARTHROSCOPY WITH ROTATOR CUFF REPAIR Right 2014    Family History  Problem Relation Age of Onset  . Diabetes Mother   . Hypertension Mother   . Hyperlipidemia Mother   . Heart failure Mother   . Leukemia Father   . Stroke Father     Social History   Tobacco Use  . Smoking status: Never Smoker  . Smokeless tobacco: Never Used  Vaping Use  .  Vaping Use: Never used  Substance Use Topics  . Alcohol use: Never  . Drug use: Never    ROS   Objective:   Vitals: BP 109/66 (BP Location: Right Arm)   Pulse (!) 58   Temp 97.6 F (36.4 C) (Oral)   Resp 20   SpO2 93%   Wt Readings from Last 3 Encounters:  03/14/20 197 lb 11.2 oz (89.7 kg)  03/10/20 200 lb (90.7 kg)  02/10/20 205 lb (93 kg)   Temp Readings from Last 3 Encounters:  03/19/20 97.6 F (36.4 C) (Oral)  03/14/20 98.8 F (37.1 C) (Tympanic)  03/10/20 97.8 F (36.6 C) (Oral)   BP Readings from Last 3 Encounters:  03/19/20 109/66  03/14/20 122/80  03/10/20 133/85   Pulse Readings from Last 3 Encounters:  03/19/20 (!) 58  03/14/20 87  03/10/20 (!) 46   Physical Exam Constitutional:      General: He is not in  acute distress.    Appearance: Normal appearance. He is well-developed. He is not ill-appearing, toxic-appearing or diaphoretic.  HENT:     Head: Normocephalic and atraumatic.     Right Ear: External ear normal.     Left Ear: External ear normal.     Nose: Nose normal.     Mouth/Throat:     Mouth: Mucous membranes are moist.     Pharynx: Oropharynx is clear.  Eyes:     General: No scleral icterus.    Extraocular Movements: Extraocular movements intact.     Pupils: Pupils are equal, round, and reactive to light.  Cardiovascular:     Rate and Rhythm: Normal rate and regular rhythm.     Heart sounds: Normal heart sounds. No murmur heard. No friction rub. No gallop.   Pulmonary:     Effort: Pulmonary effort is normal. No respiratory distress.     Breath sounds: Normal breath sounds. No stridor. No wheezing, rhonchi or rales.  Neurological:     Mental Status: He is alert and oriented to person, place, and time.  Psychiatric:        Mood and Affect: Mood normal.        Behavior: Behavior normal.        Thought Content: Thought content normal.        Judgment: Judgment normal.     Assessment and Plan :   PDMP not reviewed this encounter.  1. Acute bronchitis, unspecified organism   2. Abnormal chest x-ray   3. History of DVT (deep vein thrombosis)     Patient does not have any active chest pain, shortness of breath is not tachycardic.  Discussed at length the possibility of pulmonary embolism given his normal x-rays.  However, we will have him pursue this avenue through his general practitioner.  If he develops symptoms of chest PE then report to the emergency immediately.  Otherwise given his response to the antibiotic course and the steroid injection, will use an extra 30 mg dosing for 5 days.  Deferred chest x-ray given the previous 2 in the last 2 weeks.  Use supportive care otherwise.  Counseled patient on potential for adverse effects with medications prescribed/recommended  today, ER and return-to-clinic precautions discussed, patient verbalized understanding.    Jaynee Eagles, PA-C 03/19/20 1654

## 2020-03-19 NOTE — ED Triage Notes (Signed)
Patient states he has been having a cough since last visit and it has improved slightly. Pt still has a mild sore throat and is hoarse. Pt is aox4 and ambulatory.

## 2020-03-21 ENCOUNTER — Telehealth: Payer: Self-pay | Admitting: *Deleted

## 2020-03-21 NOTE — Telephone Encounter (Signed)
Received call from patient requesting that Dr. Lorenso Courier go ahead and request CT scan of abd and pelvis as his other doctor, Dr. Margaretann Loveless, has ordered the chest CT scan. Pt would like the 2 sets of scans done on the same day.  The chest CT scan has an expected date of 05/12/20  Dr. Lorenso Courier was made aware

## 2020-03-22 ENCOUNTER — Encounter: Payer: Self-pay | Admitting: Physician Assistant

## 2020-03-22 ENCOUNTER — Telehealth: Payer: Self-pay

## 2020-03-22 ENCOUNTER — Ambulatory Visit (INDEPENDENT_AMBULATORY_CARE_PROVIDER_SITE_OTHER): Payer: Medicare Other | Admitting: Physician Assistant

## 2020-03-22 ENCOUNTER — Other Ambulatory Visit: Payer: Self-pay

## 2020-03-22 DIAGNOSIS — R071 Chest pain on breathing: Secondary | ICD-10-CM | POA: Diagnosis not present

## 2020-03-22 NOTE — Patient Instructions (Signed)
It was great to see you!  We are scheduling a stat CT angiogram of your chest to rule out blood clot.  We will call you very soon to schedule.  If you develop chest pain or worsening shortness of breath in the meantime, please go to the ER.  Take care,  Inda Coke PA-C

## 2020-03-22 NOTE — Telephone Encounter (Signed)
I spoke with Joaquim Lai the patients wife and explained that I have not gotten approval from [facility] for the CT yet-the rep at [facility] stated it will take 24 hours to process and I faxed in all the documents that they requested

## 2020-03-22 NOTE — Telephone Encounter (Signed)
Pt is requesting a call in regards to hit CT

## 2020-03-22 NOTE — Progress Notes (Signed)
Taylor Berger is a 74 y.o. male here for a follow up of a pre-existing problem.  I acted as a Education administrator for [employer], PA-C Anselmo Pickler, LPN   History of Present Illness:   Chief Complaint  Patient presents with  . URI    HPI    URI Pt went to urgent care on Sunday, due to oxygen level was 92 at home. He is currently being treated for a URI. Has received Augmentin, Azithromycin, tessalon, prednisone in the past after his first urgent care experience. He also has used albuterol prn.   At this past urgent care appointment, he was prescribed prednisone and cough medicine and was told to follow up. He is coughing and expectorating clear sputum. Denies fever or chills.  Overall feels much better.  Urgent care recommended he follow-up with [place] regarding possible blood clot in his chest. Does have hx of DVT that was treated with eliquis.  Wt Readings from Last 4 Encounters:  03/22/20 202 lb (91.6 kg)  03/14/20 197 lb 11.2 oz (89.7 kg)  03/10/20 200 lb (90.7 kg)  02/10/20 205 lb (93 kg)     Past Medical History:  Diagnosis Date  . Allergy    Seasonal  . Aortic aneurysm ([place])   . Depression   . DVT (deep venous thrombosis) ([place]) 2020  . GERD (gastroesophageal reflux disease)   . Goiter   . Hyperlipidemia   . Hypertension   . Lymphoma ([place]) [id number]   Follicular, stomach  . Neuropathy of both feet      Social History   Tobacco Use  . Smoking status: Never Smoker  . Smokeless tobacco: Never Used  Vaping Use  . Vaping Use: Never used  Substance Use Topics  . Alcohol use: Never  . Drug use: Never    Past Surgical History:  Procedure Laterality Date  . CERVICAL DISCECTOMY  1995   C-7  . CERVICAL FUSION  01/1989   C-3 through C-6 / no metal  . FOOT SURGERY Left 1999   Plantar fascitis  . low back fusion  2013   Lumbar & Low Thoracic with metal  . SHOULDER ARTHROSCOPY WITH ROTATOR CUFF REPAIR Right 2014    Family History  Problem Relation Age of Onset  .  Diabetes Mother   . Hypertension Mother   . Hyperlipidemia Mother   . Heart failure Mother   . Leukemia Father   . Stroke Father     No Known Allergies  Current Medications:   Current Outpatient Medications:  .  albuterol (VENTOLIN HFA) 108 (90 Base) MCG/ACT inhaler, Inhale 1-2 puffs into the lungs every 6 (six) hours as needed for wheezing or shortness of breath., Disp: 18 g, Rfl: 0 .  amLODipine (NORVASC) 10 MG tablet, Take 1 tablet (10 mg total) by mouth daily., Disp: 90 tablet, Rfl: 1 .  atorvastatin (LIPITOR) 40 MG tablet, Take 1 tablet (40 mg total) by mouth daily., Disp: 90 tablet, Rfl: 1 .  benzonatate (TESSALON) 100 MG capsule, Take 1-2 capsules (100-200 mg total) by mouth 3 (three) times daily as needed., Disp: 60 capsule, Rfl: 0 .  cetirizine (ZYRTEC) 10 MG tablet, Take 10 mg by mouth daily., Disp: , Rfl:  .  clindamycin (CLEOCIN-T) 1 % external solution, Apply topically 2 (two) times daily., Disp: 30 mL, Rfl: 0 .  esomeprazole (NEXIUM) 20 MG capsule, Take 20 mg by mouth daily at 12 noon., Disp: , Rfl:  .  fluticasone (FLONASE) 50 MCG/ACT nasal spray, Place  into both nostrils as needed for allergies or rhinitis., Disp: , Rfl:  .  gabapentin (NEURONTIN) 300 MG capsule, Take 1 capsule (300 mg total) by mouth 3 (three) times daily., Disp: 270 capsule, Rfl: 1 .  lisinopril (ZESTRIL) 5 MG tablet, Take 1 tablet (5 mg total) by mouth daily., Disp: 90 tablet, Rfl: 1 .  predniSONE (DELTASONE) 10 MG tablet, Take 3 tablets (30 mg total) by mouth daily with breakfast., Disp: 15 tablet, Rfl: 0 .  promethazine-dextromethorphan (PROMETHAZINE-DM) 6.25-15 MG/5ML syrup, Take 5 mLs by mouth at bedtime as needed for cough., Disp: 100 mL, Rfl: 0 .  valACYclovir (VALTREX) 500 MG tablet, Take 1 tablet (500 mg total) by mouth daily., Disp: 90 tablet, Rfl: 1   Review of Systems:   ROS  Negative unless otherwise specified per HPI.  Vitals:   Vitals:   03/22/20 0735  BP: 122/70  Pulse: 78   Temp: 98.5 F (36.9 C)  TempSrc: Temporal  SpO2: 94%  Weight: 202 lb (91.6 kg)  Height: 5\' 8"  (1.727 m)     Body mass index is 30.71 kg/m.  Physical Exam:   Physical Exam Vitals and nursing note reviewed.  Constitutional:      General: He is not in acute distress.    Appearance: He is well-developed. He is not ill-appearing, toxic-appearing or sickly-appearing.  Cardiovascular:     Rate and Rhythm: Normal rate and regular rhythm.     Pulses: Normal pulses.     Heart sounds: Normal heart sounds, S1 normal and S2 normal.     Comments: No LE edema Pulmonary:     Effort: Pulmonary effort is normal.     Breath sounds: Normal breath sounds.  Skin:    General: Skin is warm, dry and intact.  Neurological:     Mental Status: He is alert.     GCS: GCS eye subscore is 4. GCS verbal subscore is 5. GCS motor subscore is 6.  Psychiatric:        Mood and Affect: Mood and affect normal.        Speech: Speech normal.        Behavior: Behavior normal. Behavior is cooperative.     Assessment and Plan:   Maria was seen today for uri.  Diagnoses and all orders for this visit:  Chest pain on breathing -     CT ANGIO CHEST PE W OR WO CONTRAST; Future   Will order stat CTA to r/o PE given ongoing SOB, hx of cancer and hx of prior DVT. Follow-up based on symptoms. Was instructed to get to the ER if worsening chest pain or shortness of breath.  CMA or LPN served as scribe during this visit. History, Physical, and Plan performed by medical provider. The above documentation has been reviewed and is accurate and complete.  Inda Coke, PA-C

## 2020-03-22 NOTE — Telephone Encounter (Signed)
I already spoke to the patients wife I am waiting for approval still

## 2020-03-23 ENCOUNTER — Ambulatory Visit: Payer: Medicare Other | Admitting: Physician Assistant

## 2020-03-23 ENCOUNTER — Encounter (HOSPITAL_COMMUNITY): Payer: Self-pay

## 2020-03-23 ENCOUNTER — Other Ambulatory Visit: Payer: Self-pay

## 2020-03-23 ENCOUNTER — Ambulatory Visit (HOSPITAL_COMMUNITY)
Admission: RE | Admit: 2020-03-23 | Discharge: 2020-03-23 | Disposition: A | Discharge status: Home / Self Care | Payer: Medicare Other | Source: Ambulatory Visit | Attending: Physician Assistant | Admitting: Physician Assistant

## 2020-03-23 DIAGNOSIS — R071 Chest pain on breathing: Secondary | ICD-10-CM | POA: Diagnosis not present

## 2020-03-23 MED ORDER — IOHEXOL 350 MG/ML SOLN
100.0000 mL | Freq: Once | INTRAVENOUS | Status: AC | PRN
  Administered 2020-03-23: 11:00:00 77 mL via INTRAVENOUS

## 2020-03-27 ENCOUNTER — Other Ambulatory Visit: Payer: Self-pay

## 2020-03-27 ENCOUNTER — Ambulatory Visit
Admission: EM | Admit: 2020-03-27 | Discharge: 2020-03-27 | Disposition: A | Discharge status: Home / Self Care | Payer: Medicare Other | Attending: Emergency Medicine | Admitting: Emergency Medicine

## 2020-03-27 DIAGNOSIS — R1032 Left lower quadrant pain: Secondary | ICD-10-CM

## 2020-03-27 DIAGNOSIS — R112 Nausea with vomiting, unspecified: Secondary | ICD-10-CM | POA: Diagnosis not present

## 2020-03-27 MED ORDER — CIPROFLOXACIN HCL 500 MG PO TABS: 500.0000 mg | ORAL_TABLET | Freq: Two times a day (BID) | ORAL | 0 refills | Status: AC

## 2020-03-27 MED ORDER — METRONIDAZOLE 500 MG PO TABS: 500.0000 mg | ORAL_TABLET | Freq: Two times a day (BID) | ORAL | 0 refills | Status: AC

## 2020-03-27 MED ORDER — TRAMADOL HCL 50 MG PO TABS: 50.0000 mg | ORAL_TABLET | Freq: Four times a day (QID) | ORAL | 0 refills | Status: DC | PRN

## 2020-03-27 MED ORDER — ONDANSETRON 4 MG PO TBDP
4.0000 mg | ORAL_TABLET | Freq: Once | ORAL | Status: AC
  Administered 2020-03-27: 10:00:00 4 mg via ORAL

## 2020-03-27 MED ORDER — ONDANSETRON 4 MG PO TBDP: 4.0000 mg | ORAL_TABLET | Freq: Three times a day (TID) | ORAL | 0 refills | Status: DC | PRN

## 2020-03-27 NOTE — ED Provider Notes (Signed)
[facility]    CSN: [id number] Arrival date & time: 03/27/20  0850      History   Chief Complaint Chief Complaint  Patient presents with   Abdominal Pain    Since yesterday on left side    HPI Taylor Berger is a 74 y.o. male history of aortic aneurysm, prior DVT, GERD, presenting today for evaluation of nausea and abdominal pain.  Patient reports that beginning last night he began to develop nausea vomiting as well as abdominal pain.  Symptoms began after eating Christmas dinner.  Reports most of pain to the left and lower abdomen.  Denies blood in vomit.  Denies diarrhea or change in stools.  Last bowel movement yesterday afternoon.  Does have history of diverticulosis, but no history of diverticulitis.  Also has AAA and lymphoma which she has an upcoming scan for.   He was seen here earlier in December for similar symptoms, has completed course of Augmentin, azithromycin as well as prednisone.  X-ray at this time showed early pneumonia.  Followed up in emergency room a few days later and O2 sat improved and had negative Covid testing.  Was seen here on 12/18 for recheck of cough and hoarseness.  Was refilled albuterol inhaler, provided another round of prednisone, Tessalon as well as Phenergan DM for cough.  HPI  Past Medical History:  Diagnosis Date   Allergy    Seasonal   Aortic aneurysm ([place])    Depression    DVT (deep venous thrombosis) ([place]) 2020   GERD (gastroesophageal reflux disease)    Goiter    Hyperlipidemia    Hypertension    Lymphoma ([place]) 8099   Follicular, stomach   Neuropathy of both feet     Patient Active Problem List   Diagnosis Date Noted   Follicular lymphoma grade I of intra-abdominal lymph nodes ([place]) 03/07/2020   Hypertension 01/29/2020   Thoracic aortic aneurysm ([place]) 01/29/2020   DVT R femoral thigh 01/29/2020   GERD (gastroesophageal reflux disease) 01/29/2020   History of Barrett's esophagus 01/29/2020    Goiter 01/29/2020   Neuropathy of both feet    Lymphoma ([place]) 2020    Past Surgical History:  Procedure Laterality Date   CERVICAL DISCECTOMY  1995   C-7   CERVICAL FUSION  01/1989   C-3 through C-6 / no metal   FOOT SURGERY Left 1999   Plantar fascitis   low back fusion  2013   Lumbar & Low Thoracic with metal   SHOULDER ARTHROSCOPY WITH ROTATOR CUFF REPAIR Right 2014       Home Medications    Prior to Admission medications   Medication Sig Start Date End Date Taking? Authorizing Provider  albuterol (VENTOLIN HFA) 108 (90 Base) MCG/ACT inhaler Inhale 1-2 puffs into the lungs every 6 (six) hours as needed for wheezing or shortness of breath. 03/19/20   Jaynee Eagles, PA-C  amLODipine (NORVASC) 10 MG tablet Take 1 tablet (10 mg total) by mouth daily. 02/24/20   Inda Coke, PA  atorvastatin (LIPITOR) 40 MG tablet Take 1 tablet (40 mg total) by mouth daily. 02/24/20   Inda Coke, PA  benzonatate (TESSALON) 100 MG capsule Take 1-2 capsules (100-200 mg total) by mouth 3 (three) times daily as needed. 03/19/20   Jaynee Eagles, PA-C  cetirizine (ZYRTEC) 10 MG tablet Take 10 mg by mouth daily.    [provider]  ciprofloxacin (CIPRO) 500 MG tablet Take 1 tablet (500 mg total) by mouth 2 (two) times daily  for 7 days. 03/27/20 04/03/20  Christepher Melchior C, PA-C  clindamycin (CLEOCIN-T) 1 % external solution Apply topically 2 (two) times daily. 02/05/20   Inda Coke, PA  esomeprazole (NEXIUM) 20 MG capsule Take 20 mg by mouth daily at 12 noon.    [provider]  fluticasone (FLONASE) 50 MCG/ACT nasal spray Place into both nostrils as needed for allergies or rhinitis.    [provider]  gabapentin (NEURONTIN) 300 MG capsule Take 1 capsule (300 mg total) by mouth 3 (three) times daily. 02/24/20   Inda Coke, PA  lisinopril (ZESTRIL) 5 MG tablet Take 1 tablet (5 mg total) by mouth daily. 02/24/20   Inda Coke, PA  metroNIDAZOLE (FLAGYL)  500 MG tablet Take 1 tablet (500 mg total) by mouth 2 (two) times daily for 7 days. 03/27/20 04/03/20  Nayely Dingus C, PA-C  ondansetron (ZOFRAN ODT) 4 MG disintegrating tablet Take 1 tablet (4 mg total) by mouth every 8 (eight) hours as needed for nausea or vomiting. 03/27/20   Donella Pascarella C, PA-C  predniSONE (DELTASONE) 10 MG tablet Take 3 tablets (30 mg total) by mouth daily with breakfast. 03/19/20   Jaynee Eagles, PA-C  promethazine-dextromethorphan (PROMETHAZINE-DM) 6.25-15 MG/5ML syrup Take 5 mLs by mouth at bedtime as needed for cough. 03/19/20   Jaynee Eagles, PA-C  traMADol (ULTRAM) 50 MG tablet Take 1 tablet (50 mg total) by mouth every 6 (six) hours as needed. 03/27/20   Ren Aspinall C, PA-C  valACYclovir (VALTREX) 500 MG tablet Take 1 tablet (500 mg total) by mouth daily. 02/24/20   Inda Coke, PA    Family History Family History  Problem Relation Age of Onset   Diabetes Mother    Hypertension Mother    Hyperlipidemia Mother    Heart failure Mother    Leukemia Father    Stroke Father     Social History Social History   Tobacco Use   Smoking status: Never Smoker   Smokeless tobacco: Never Used  Scientific laboratory technician Use: Never used  Substance Use Topics   Alcohol use: Never   Drug use: Never     Allergies   Patient has no known allergies.   Review of Systems Review of Systems  Constitutional: Negative for activity change, appetite change, chills, fatigue and fever.  HENT: Negative for congestion, ear pain, rhinorrhea, sinus pressure, sore throat and trouble swallowing.   Eyes: Negative for discharge and redness.  Respiratory: Positive for cough and shortness of breath. Negative for chest tightness.   Cardiovascular: Negative for chest pain.  Gastrointestinal: Positive for nausea. Negative for abdominal pain, diarrhea and vomiting.  Musculoskeletal: Negative for myalgias.  Skin: Negative for rash.  Neurological: Negative for dizziness,  light-headedness and headaches.     Physical Exam Triage Vital Signs ED Triage Vitals  Enc Vitals Group     BP      Pulse      Resp      Temp      Temp src      SpO2      Weight      Height      Head Circumference      Peak Flow      Pain Score      Pain Loc      Pain Edu?      Excl. in [place]?    No data found.  Updated Vital Signs BP (!) 144/84 (BP Location: Left Arm)    Pulse 63  Temp 98.2 F (36.8 C) (Oral)    Resp (!) 22    SpO2 96%   Visual Acuity Right Eye Distance:   Left Eye Distance:   Bilateral Distance:    Right Eye Near:   Left Eye Near:    Bilateral Near:     Physical Exam Vitals and nursing note reviewed.  Constitutional:      Appearance: He is well-developed and well-nourished.     Comments: No acute distress  HENT:     Head: Normocephalic and atraumatic.     Ears:     Comments: Bilateral ears without tenderness to palpation of external auricle, tragus and mastoid, EAC's without erythema or swelling, TM's with good bony landmarks and cone of light. Non erythematous.     Nose: Nose normal.     Mouth/Throat:     Comments: Oral mucosa pink and moist, no tonsillar enlargement or exudate. Posterior pharynx patent and nonerythematous, no uvula deviation or swelling. Normal phonation. Eyes:     Conjunctiva/sclera: Conjunctivae normal.  Cardiovascular:     Rate and Rhythm: Normal rate.  Pulmonary:     Effort: Pulmonary effort is normal. No respiratory distress.     Comments: Breathing comfortably at rest, CTABL, no wheezing, rales or other adventitious sounds auscultated Abdominal:     General: There is no distension.     Comments: Abdomen soft, nondistended, tender to palpation to left upper and lower quadrants, more focal in lower abdomen, negative rebound, negative Rovsing, negative McBurney's, negative Murphy's, no pulsatile mass palpated  Musculoskeletal:        General: Normal range of motion.     Cervical back: Neck supple.  Skin:     General: Skin is warm and dry.  Neurological:     Mental Status: He is alert and oriented to person, place, and time.  Psychiatric:        Mood and Affect: Mood and affect normal.      UC Treatments / Results  Labs (all labs ordered are listed, but only abnormal results are displayed) Labs Reviewed - No data to display  EKG   Radiology No results found.  Procedures Procedures (including critical care time)  Medications Ordered in UC Medications  ondansetron (ZOFRAN-ODT) disintegrating tablet 4 mg (4 mg Oral Given 03/27/20 1015)    Initial Impression / Assessment and Plan / UC Course  I have reviewed the triage vital signs and the nursing notes.  Pertinent labs & imaging results that were available during my care of the patient were reviewed by me and considered in my medical decision making (see chart for details).     Left lower quadrant abdominal pain with associated nausea-pain seems more intense than viral gastroenteritis, concerning for possible diverticulitis.  Discussed with patient typically requiring scan prior to initiating treatment for this without prior history, services hesitancy regarding further scanning as she has upcoming scan planned as well.  Opted to go ahead and empirically treat for diverticulitis with Cipro and Flagyl along with Zofran for nausea.  Tramadol for severe pain.  Patient to go to emergency room in the next 24 to 48 hours if not having any improvement with symptoms with diverticulitis treatment.  Discussed strict return precautions. Patient verbalized understanding and is agreeable with plan.  Final Clinical Impressions(s) / UC Diagnoses   Final diagnoses:  Non-intractable vomiting with nausea, unspecified vomiting type  Abdominal pain, left lower quadrant     Discharge Instructions     Zofran dissolved in mouth as  needed for nausea/vomiting Begin Cipro and Flagyl courses of antibiotics twice daily for 1 week Tramadol for severe  pain-use sparingly, may cause drowsiness Please go to the emergency room if not seeing any improvement with your pain over the next 24 to 48 hours      ED Prescriptions    Medication Sig Dispense Auth. Provider   ondansetron (ZOFRAN ODT) 4 MG disintegrating tablet Take 1 tablet (4 mg total) by mouth every 8 (eight) hours as needed for nausea or vomiting. 20 tablet Zachari Alberta C, PA-C   metroNIDAZOLE (FLAGYL) 500 MG tablet Take 1 tablet (500 mg total) by mouth 2 (two) times daily for 7 days. 14 tablet Rusty Glodowski C, PA-C   ciprofloxacin (CIPRO) 500 MG tablet Take 1 tablet (500 mg total) by mouth 2 (two) times daily for 7 days. 14 tablet Thanh Mottern C, PA-C   traMADol (ULTRAM) 50 MG tablet Take 1 tablet (50 mg total) by mouth every 6 (six) hours as needed. 10 tablet Crystalynn Mcinerney, [place] C, PA-C     I have reviewed the PDMP during this encounter.   Janith Lima, PA-C 03/27/20 1015

## 2020-03-27 NOTE — Discharge Instructions (Addendum)
Zofran dissolved in mouth as needed for nausea/vomiting Begin Cipro and Flagyl courses of antibiotics twice daily for 1 week Tramadol for severe pain-use sparingly, may cause drowsiness Please go to the emergency room if not seeing any improvement with your pain over the next 24 to 48 hours

## 2020-03-27 NOTE — ED Triage Notes (Signed)
Pt states he thinks he ate something yesterday that has caused him to have LLQ pain that radiates up under his ribcage. Pt also complains of nausea and vomiting x 4 episodes. Pt is aox4 and ambulatory.

## 2020-03-28 ENCOUNTER — Ambulatory Visit: Payer: Self-pay

## 2020-03-29 ENCOUNTER — Telehealth (INDEPENDENT_AMBULATORY_CARE_PROVIDER_SITE_OTHER): Payer: Medicare Other | Admitting: Physician Assistant

## 2020-03-29 ENCOUNTER — Encounter: Payer: Self-pay | Admitting: Physician Assistant

## 2020-03-29 DIAGNOSIS — R1032 Left lower quadrant pain: Secondary | ICD-10-CM | POA: Diagnosis not present

## 2020-03-29 NOTE — Progress Notes (Signed)
Virtual Visit via Video   I connected with Taylor Berger on 03/29/20 at 11:30 AM EST by a video enabled telemedicine application and verified that I am speaking with the correct person using two identifiers. Location patient: Home Location provider: [facility], Office Persons participating in the virtual visit: Taylor Bankererry Millis, Jarold MottoSamantha Malessa Zartman PA-C, [name], LPN   I discussed the limitations of evaluation and management by telemedicine and the availability of in person appointments. The patient expressed understanding and agreed to proceed.  I acted as a Neurosurgeonscribe for Energy East CorporationSamantha Niomie Englert, PA-C [employer]ly-ClarkDonna Orphanos, LPN   Subjective:   HPI:   Medication questions Pt was seen at the Urgent care on 12/26 and Dx with Diverticulitis. Pt was given Cipro, Flagyl and Tramadol for empiric treatment of diverticulitis. He has no prior hx of diverticulitis but does have diverticulosis per colonoscopy from 2016. Did not have CT scan to confirm this because he has long hx of requiring multiple CTs and he has some upcoming tests scheduled for the next upcoming months.  Passing a small amount of gas. Abdominal pain has essentially resolved, however he is still taking tramadol BID. Eating regular foods, TV dinners. Feels appetite is normal.   ROS: See pertinent positives and negatives per HPI.  Patient Active Problem List   Diagnosis Date Noted  . Follicular lymphoma grade I of intra-abdominal lymph nodes (HCC) 03/07/2020  . Hypertension 01/29/2020  . Thoracic aortic aneurysm (HCC) 01/29/2020  . DVT R femoral thigh 01/29/2020  . GERD (gastroesophageal reflux disease) 01/29/2020  . History of Barrett's esophagus 01/29/2020  . Goiter 01/29/2020  . Neuropathy of both feet   . Lymphoma (HCC) 2020    Social History   Tobacco Use  . Smoking status: Never Smoker  . Smokeless tobacco: Never Used  Substance Use Topics  . Alcohol use: Never    Current Outpatient Medications:  .  albuterol (VENTOLIN HFA)  108 (90 Base) MCG/ACT inhaler, Inhale 1-2 puffs into the lungs every 6 (six) hours as needed for wheezing or shortness of breath., Disp: 18 g, Rfl: 0 .  amLODipine (NORVASC) 10 MG tablet, Take 1 tablet (10 mg total) by mouth daily., Disp: 90 tablet, Rfl: 1 .  atorvastatin (LIPITOR) 40 MG tablet, Take 1 tablet (40 mg total) by mouth daily., Disp: 90 tablet, Rfl: 1 .  benzonatate (TESSALON) 100 MG capsule, Take 1-2 capsules (100-200 mg total) by mouth 3 (three) times daily as needed., Disp: 60 capsule, Rfl: 0 .  cetirizine (ZYRTEC) 10 MG tablet, Take 10 mg by mouth daily., Disp: , Rfl:  .  ciprofloxacin (CIPRO) 500 MG tablet, Take 1 tablet (500 mg total) by mouth 2 (two) times daily for 7 days., Disp: 14 tablet, Rfl: 0 .  clindamycin (CLEOCIN-T) 1 % external solution, Apply topically 2 (two) times daily., Disp: 30 mL, Rfl: 0 .  esomeprazole (NEXIUM) 20 MG capsule, Take 20 mg by mouth daily at 12 noon., Disp: , Rfl:  .  fluticasone (FLONASE) 50 MCG/ACT nasal spray, Place into both nostrils as needed for allergies or rhinitis., Disp: , Rfl:  .  gabapentin (NEURONTIN) 300 MG capsule, Take 1 capsule (300 mg total) by mouth 3 (three) times daily., Disp: 270 capsule, Rfl: 1 .  lisinopril (ZESTRIL) 5 MG tablet, Take 1 tablet (5 mg total) by mouth daily., Disp: 90 tablet, Rfl: 1 .  metroNIDAZOLE (FLAGYL) 500 MG tablet, Take 1 tablet (500 mg total) by mouth 2 (two) times daily for 7 days., Disp: 14 tablet,  Rfl: 0 .  ondansetron (ZOFRAN ODT) 4 MG disintegrating tablet, Take 1 tablet (4 mg total) by mouth every 8 (eight) hours as needed for nausea or vomiting., Disp: 20 tablet, Rfl: 0 .  promethazine-dextromethorphan (PROMETHAZINE-DM) 6.25-15 MG/5ML syrup, Take 5 mLs by mouth at bedtime as needed for cough., Disp: 100 mL, Rfl: 0 .  traMADol (ULTRAM) 50 MG tablet, Take 1 tablet (50 mg total) by mouth every 6 (six) hours as needed., Disp: 10 tablet, Rfl: 0 .  valACYclovir (VALTREX) 500 MG tablet, Take 1 tablet (500  mg total) by mouth daily., Disp: 90 tablet, Rfl: 1  No Known Allergies  Objective:   VITALS: Per patient if applicable, see vitals. GENERAL: Alert, appears well and in no acute distress. HEENT: Atraumatic, conjunctiva clear, no obvious abnormalities on inspection of external nose and ears. NECK: Normal movements of the head and neck. CARDIOPULMONARY: No increased WOB. Speaking in clear sentences. I:E ratio WNL.  MS: Moves all visible extremities without noticeable abnormality. PSYCH: Pleasant and cooperative, well-groomed. Speech normal rate and rhythm. Affect is appropriate. Insight and judgement are appropriate. Attention is focused, linear, and appropriate.  NEURO: CN grossly intact. Oriented as arrived to appointment on time with no prompting. Moves both UE equally.  SKIN: No obvious lesions, wounds, erythema, or cyanosis noted on face or hands.  Assessment and Plan:   Arieon was seen today for medication questions.  Diagnoses and all orders for this visit:  Left lower quadrant abdominal pain   Symptoms have essentially resolved. Constipation likely due to medication side effect. Start miralax daily, 1 capful, until having regular BMs. If any changes or new symptoms, low threshold to obtain CT abd/pel. Strict worsening precautions discussed.  I discussed the assessment and treatment plan with the patient. The patient was provided an opportunity to ask questions and all were answered. The patient agreed with the plan and demonstrated an understanding of the instructions.   The patient was advised to call back or seek an in-person evaluation if the symptoms worsen or if the condition fails to improve as anticipated.   CMA or LPN served as scribe during this visit. History, Physical, and Plan performed by medical provider. The above documentation has been reviewed and is accurate and complete.  [place], [place] 03/29/2020

## 2020-03-30 ENCOUNTER — Encounter: Payer: Self-pay | Admitting: Physician Assistant

## 2020-03-30 ENCOUNTER — Ambulatory Visit (HOSPITAL_COMMUNITY)
Admission: RE | Admit: 2020-03-30 | Discharge: 2020-03-30 | Disposition: A | Discharge status: Home / Self Care | Payer: Medicare Other | Source: Ambulatory Visit | Attending: Physician Assistant | Admitting: Physician Assistant

## 2020-03-30 ENCOUNTER — Telehealth: Payer: Self-pay | Admitting: Physician Assistant

## 2020-03-30 ENCOUNTER — Ambulatory Visit: Payer: Medicare Other | Admitting: Physician Assistant

## 2020-03-30 ENCOUNTER — Other Ambulatory Visit: Payer: Self-pay

## 2020-03-30 ENCOUNTER — Ambulatory Visit (INDEPENDENT_AMBULATORY_CARE_PROVIDER_SITE_OTHER): Payer: Medicare Other | Admitting: Physician Assistant

## 2020-03-30 ENCOUNTER — Other Ambulatory Visit: Payer: Self-pay | Admitting: Physician Assistant

## 2020-03-30 VITALS — BP 110/62 | HR 79 | Temp 98.4°F | Ht 68.0 in | Wt 194.5 lb

## 2020-03-30 DIAGNOSIS — M79604 Pain in right leg: Secondary | ICD-10-CM

## 2020-03-30 MED ORDER — RIVAROXABAN 20 MG PO TABS: 20.0000 mg | ORAL_TABLET | Freq: Every day | ORAL | 0 refills | Status: DC

## 2020-03-30 MED ORDER — RIVAROXABAN (XARELTO) VTE STARTER PACK (15 & 20 MG): ORAL_TABLET | ORAL | 0 refills | Status: DC

## 2020-03-30 NOTE — Progress Notes (Signed)
Taylor Berger is a Berger y.o. male here for a new problem.  I acted as a Neurosurgeon for [employer], PA-C Taylor Mull, LPN   History of Present Illness:   Chief Complaint  Patient presents with  . Pain behind knee    HPI   R leg pain Pt c/o pain behind right knee x 1 week, radiates down back of calf and up into back of thigh. He has not taken anything for pain. Has history of positive DVT last year in R femoral region. Took eliquis for 3 months. Also has positive hx of follicular lymphoma.  Denies: palpitations, SOB, chest pain, lightheadedness, dizziness  We did CTA on 03/23/20 to r/o PE -- this was negative. As he was having ongoing SOB and chest tightness from bronchitis. This has improved.    Past Medical History:  Diagnosis Date  . Allergy    Seasonal  . Aortic aneurysm (HCC)   . Depression   . DVT (deep venous thrombosis) (HCC) 2020  . GERD (gastroesophageal reflux disease)   . Goiter   . Hyperlipidemia   . Hypertension   . Lymphoma (HCC) 2020   Follicular, stomach  . Neuropathy of both feet      Social History   Tobacco Use  . Smoking status: Never Smoker  . Smokeless tobacco: Never Used  Vaping Use  . Vaping Use: Never used  Substance Use Topics  . Alcohol use: Never  . Drug use: Never    Past Surgical History:  Procedure Laterality Date  . CERVICAL DISCECTOMY  1995   C-7  . CERVICAL FUSION  01/1989   C-3 through C-6 / no metal  . FOOT SURGERY Left 1999   Plantar fascitis  . low back fusion  2013   Lumbar & Low Thoracic with metal  . SHOULDER ARTHROSCOPY WITH ROTATOR CUFF REPAIR Right 2014    Family History  Problem Relation Age of Onset  . Diabetes Mother   . Hypertension Mother   . Hyperlipidemia Mother   . Heart failure Mother   . Leukemia Father   . Stroke Father     No Known Allergies  Current Medications:   Current Outpatient Medications:  .  albuterol (VENTOLIN HFA) 108 (90 Base) MCG/ACT inhaler, Inhale 1-2 puffs into the  lungs every 6 (six) hours as needed for wheezing or shortness of breath., Disp: 18 g, Rfl: 0 .  amLODipine (NORVASC) 10 MG tablet, Take 1 tablet (10 mg total) by mouth daily., Disp: 90 tablet, Rfl: 1 .  atorvastatin (LIPITOR) 40 MG tablet, Take 1 tablet (40 mg total) by mouth daily., Disp: 90 tablet, Rfl: 1 .  benzonatate (TESSALON) 100 MG capsule, Take 1-2 capsules (100-200 mg total) by mouth 3 (three) times daily as needed., Disp: 60 capsule, Rfl: 0 .  cetirizine (ZYRTEC) 10 MG tablet, Take 10 mg by mouth daily., Disp: , Rfl:  .  ciprofloxacin (CIPRO) 500 MG tablet, Take 1 tablet (500 mg total) by mouth 2 (two) times daily for 7 days., Disp: 14 tablet, Rfl: 0 .  clindamycin (CLEOCIN-T) 1 % external solution, Apply topically 2 (two) times daily., Disp: 30 mL, Rfl: 0 .  esomeprazole (NEXIUM) 20 MG capsule, Take 20 mg by mouth daily at 12 noon., Disp: , Rfl:  .  fluticasone (FLONASE) 50 MCG/ACT nasal spray, Place into both nostrils as needed for allergies or rhinitis., Disp: , Rfl:  .  gabapentin (NEURONTIN) 300 MG capsule, Take 1 capsule (300 mg total) by mouth 3 (  three) times daily., Disp: 270 capsule, Rfl: 1 .  lisinopril (ZESTRIL) 5 MG tablet, Take 1 tablet (5 mg total) by mouth daily., Disp: 90 tablet, Rfl: 1 .  metroNIDAZOLE (FLAGYL) 500 MG tablet, Take 1 tablet (500 mg total) by mouth 2 (two) times daily for 7 days., Disp: 14 tablet, Rfl: 0 .  ondansetron (ZOFRAN ODT) 4 MG disintegrating tablet, Take 1 tablet (4 mg total) by mouth every 8 (eight) hours as needed for nausea or vomiting., Disp: 20 tablet, Rfl: 0 .  promethazine-dextromethorphan (PROMETHAZINE-DM) 6.25-15 MG/5ML syrup, Take 5 mLs by mouth at bedtime as needed for cough., Disp: 100 mL, Rfl: 0 .  traMADol (ULTRAM) 50 MG tablet, Take 1 tablet (50 mg total) by mouth every 6 (six) hours as needed., Disp: 10 tablet, Rfl: 0 .  valACYclovir (VALTREX) 500 MG tablet, Take 1 tablet (500 mg total) by mouth daily., Disp: 90 tablet, Rfl: 1    Review of Systems:   ROS  Negative unless otherwise specified per HPI.  Vitals:   Vitals:   03/30/20 1428  BP: 110/62  Pulse: 79  Temp: 98.4 F (36.9 C)  TempSrc: Temporal  SpO2: 94%  Weight: 194 lb 8 oz (88.2 kg)  Height: 5\' 8"  (1.727 m)     Body mass index is 29.57 kg/m.  Physical Exam:   Physical Exam Vitals and nursing note reviewed.  Constitutional:      General: He is not in acute distress.    Appearance: He is well-developed. He is not ill-appearing, toxic-appearing or sickly-appearing.  Cardiovascular:     Rate and Rhythm: Normal rate and regular rhythm.     Pulses:          Dorsalis pedis pulses are 2+ on the right side and 2+ on the left side.     Heart sounds: Normal heart sounds, S1 normal and S2 normal.  Pulmonary:     [place]: Pulmonary effort is normal.     Breath sounds: Normal breath sounds.  Musculoskeletal:     Comments: R leg: TTP to posterior knee Generalized calf swelling and mild tenderness No calf tenderness or warmth  R calf: 40.5 cm L calf: 36.75 cm  Skin:    General: Skin is warm, dry and intact.  Neurological:     Mental Status: He is alert.     GCS: GCS eye subscore is 4. GCS verbal subscore is 5. GCS motor subscore is 6.  Psychiatric:        Mood and Affect: Mood and affect normal.        Speech: Speech normal.        Behavior: Behavior normal. Behavior is cooperative.     Assessment and Plan:   Taylor Berger was seen today for pain behind knee.  Diagnoses and all orders for this visit:  Right leg pain    Symptoms, history and presentation concerning for possible DVT. STAT u/s ordered for this afternoon at 3:45 at [facility]. Will treat based on results. Worsening precautions advised in the interim.   CMA or LPN served as scribe during this visit. History, Physical, and Plan performed by medical provider. The above documentation has been reviewed and is accurate and complete.   Taylor Coke,  PA-C

## 2020-03-30 NOTE — Patient Instructions (Addendum)
It was great to see you!  We are sending you for evaluation for blood clot.  Please keep an eye on your oxygen level. If consistently in low 90's let us know.  If chest pain, palpitations, shortness of breath or other significant symptoms in the meantime, please go to the ER.  Take care,  Jarold Motto PA-C

## 2020-03-30 NOTE — Telephone Encounter (Signed)
Spoke to pt told him per Lelon Mast, Patient with positive DVT. Pt verbalized understanding.   I have sent in Xarelto for him to start (this looks like it will be more affordable for him then Eliquis, which he has used in the past) 15 mg twice daily x 3 weeks, 20 mg daily thereafter. Pt verbalized understanding.  Follow-up with me in 3 months to discuss. Also please should notify his oncologist next time he sees him about this positive result. Pt verbalized understanding.

## 2020-03-30 NOTE — Telephone Encounter (Signed)
[facility] calling for results.  Patient with positive DVT.  I have sent in Xarelto for him to start (this looks like it will be more affordable for him then Eliquis, which he has used in the past) 15 mg twice daily x 3 weeks 20 mg daily thereafter  Follow-up with me in 3 months to discuss  Patient should notify his oncologist next time he sees him about this positive result

## 2020-03-31 ENCOUNTER — Telehealth: Payer: Self-pay | Admitting: *Deleted

## 2020-03-31 NOTE — Telephone Encounter (Signed)
Received vm message from patient stating he just wanted to give Dr. Leonides Schanz some updates on hus health. Pt states on 03/28/20 he ws diagnosed with diverticulitis and then on 03/30/20 he was diagnosed with another  DVT in his right leg-where he had one 1 year ago.  Forwarded this message to Dr. Leonides Schanz

## 2020-04-04 ENCOUNTER — Ambulatory Visit (INDEPENDENT_AMBULATORY_CARE_PROVIDER_SITE_OTHER): Payer: Medicare Other | Admitting: Otolaryngology

## 2020-04-04 ENCOUNTER — Other Ambulatory Visit: Payer: Self-pay

## 2020-04-04 ENCOUNTER — Ambulatory Visit: Payer: Medicare Other | Admitting: Physician Assistant

## 2020-04-04 ENCOUNTER — Encounter (INDEPENDENT_AMBULATORY_CARE_PROVIDER_SITE_OTHER): Payer: Self-pay | Admitting: Otolaryngology

## 2020-04-04 VITALS — Temp 97.2°F

## 2020-04-04 DIAGNOSIS — E041 Nontoxic single thyroid nodule: Secondary | ICD-10-CM | POA: Diagnosis not present

## 2020-04-04 NOTE — Progress Notes (Addendum)
HPI: Taylor Berger is a 75 y.o. male who presents is referred by his PCP for evaluation of thyroid goiter.  Apparently patient recently moved here from [place] and now resides in [place].  While in [place] he was obtaining yearly ultrasounds for the past 3 to 4 to follow a thyroid nodule.  His last ultrasound was performed in January of last year.  He also has history of a recently diagnosed lymphoma as well as diverticulitis and was recently found to have a right lower leg DVT and is on Xarelto. He has had no hoarseness or difficulty swallowing..  Past Medical History:  Diagnosis Date  . Allergy    Seasonal  . Aortic aneurysm (HCC)   . Depression   . DVT (deep venous thrombosis) (HCC) 2020  . GERD (gastroesophageal reflux disease)   . Goiter   . Hyperlipidemia   . Hypertension   . Lymphoma (HCC) 2020   Follicular, stomach  . Neuropathy of both feet    Past Surgical History:  Procedure Laterality Date  . CERVICAL DISCECTOMY  1995   C-7  . CERVICAL FUSION  01/1989   C-3 through C-6 / no metal  . FOOT SURGERY Left 1999   Plantar fascitis  . low back fusion  2013   Lumbar & Low Thoracic with metal  . SHOULDER ARTHROSCOPY WITH ROTATOR CUFF REPAIR Right 2014   Social History   Socioeconomic History  . Marital status: Married    Spouse name: Not on file  . Number of children: Not on file  . Years of education: Not on file  . Highest education level: Not on file  Occupational History  . Not on file  Tobacco Use  . Smoking status: Never Smoker  . Smokeless tobacco: Never Used  Vaping Use  . Vaping Use: Never used  Substance and Sexual Activity  . Alcohol use: Never  . Drug use: Never  . Sexual activity: Not Currently  Other Topics Concern  . Not on file  Social History Narrative   ** Merged History Encounter **       Moved from [facility] to be closer to son Married   Social Determinants of Corporate investment banker Strain: Not on file  Food Insecurity: Not on file   Transportation Needs: Not on file  Physical Activity: Not on file  Stress: Not on file  Social Connections: Not on file   Family History  Problem Relation Age of Onset  . Diabetes Mother   . Hypertension Mother   . Hyperlipidemia Mother   . Heart failure Mother   . Leukemia Father   . Stroke Father    No Known Allergies Prior to Admission medications   Medication Sig Start Date End Date Taking? Authorizing Provider  albuterol (VENTOLIN HFA) 108 (90 Base) MCG/ACT inhaler Inhale 1-2 puffs into the lungs every 6 (six) hours as needed for wheezing or shortness of breath. 03/19/20   Wallis Bamberg, PA-C  amLODipine (NORVASC) 10 MG tablet Take 1 tablet (10 mg total) by mouth daily. 02/24/20   Jarold Motto, PA  atorvastatin (LIPITOR) 40 MG tablet Take 1 tablet (40 mg total) by mouth daily. 02/24/20   Jarold Motto, PA  benzonatate (TESSALON) 100 MG capsule Take 1-2 capsules (100-200 mg total) by mouth 3 (three) times daily as needed. 03/19/20   Wallis Bamberg, PA-C  cetirizine (ZYRTEC) 10 MG tablet Take 10 mg by mouth daily.    [provider]  clindamycin (CLEOCIN-T) 1 % external solution Apply  topically 2 (two) times daily. 02/05/20   Inda Coke, PA  esomeprazole (NEXIUM) 20 MG capsule Take 20 mg by mouth daily at 12 noon.    [provider]  fluticasone (FLONASE) 50 MCG/ACT nasal spray Place into both nostrils as needed for allergies or rhinitis.    [provider]  gabapentin (NEURONTIN) 300 MG capsule Take 1 capsule (300 mg total) by mouth 3 (three) times daily. 02/24/20   Inda Coke, PA  lisinopril (ZESTRIL) 5 MG tablet Take 1 tablet (5 mg total) by mouth daily. 02/24/20   Inda Coke, PA  ondansetron (ZOFRAN ODT) 4 MG disintegrating tablet Take 1 tablet (4 mg total) by mouth every 8 (eight) hours as needed for nausea or vomiting. 03/27/20   Wieters, Hallie C, PA-C  promethazine-dextromethorphan (PROMETHAZINE-DM) 6.25-15 MG/5ML syrup Take 5 mLs  by mouth at bedtime as needed for cough. 03/19/20   Jaynee Eagles, PA-C  rivaroxaban (XARELTO) 20 MG TABS tablet Take 1 tablet (20 mg total) by mouth daily with supper. 03/30/20   Inda Coke, PA  RIVAROXABAN [employer]veda Reasons) VTE STARTER PACK (15 & 20 MG) Follow package directions: Take one 15mg  tablet by mouth twice a day. On day 22, switch to one 20mg  tablet once a day. Take with food. 03/30/20   Inda Coke, PA  traMADol (ULTRAM) 50 MG tablet Take 1 tablet (50 mg total) by mouth every 6 (six) hours as needed. 03/27/20   Wieters, Hallie C, PA-C  valACYclovir (VALTREX) 500 MG tablet Take 1 tablet (500 mg total) by mouth daily. 02/24/20   Inda Coke, PA     Positive ROS: Otherwise negative  All other systems have been reviewed and were otherwise negative with the exception of those mentioned in the HPI and as above.  Physical Exam: Constitutional: Alert, well-appearing, no acute distress Ears: External ears without lesions or tenderness. Ear canals are clear bilaterally with intact, clear TMs.  Nasal: External nose without lesions.. Clear nasal passages Oral: Lips and gums without lesions. Tongue and palate mucosa without lesions. Posterior oropharynx clear. Neck: No palpable adenopathy or masses.  He has no supraclavicular adenopathy and no significant adenopathy along either side of the neck.  On palpation of the thyroid region he does not have a significantly enlarged goiter or discrete thyroid nodule on my palpation. Respiratory: Breathing comfortably  Skin: No facial/neck lesions or rash noted.  Procedures  Assessment: History of thyroid goiter and thyroid nodule that has been followed on annual basis with ultrasounds.  Plan: We will go ahead and schedule an ultrasound for yearly follow-up as well as baseline since he recently moved to [place]. Patient will call us a couple days following the ultrasound concerning results of the ultrasound.  Of note if FNA is recommended  patient is on Xarelto because of recently diagnosed DVT.   Radene Journey, MD   CC:

## 2020-04-07 ENCOUNTER — Other Ambulatory Visit (INDEPENDENT_AMBULATORY_CARE_PROVIDER_SITE_OTHER): Payer: Self-pay

## 2020-04-07 DIAGNOSIS — D440 Neoplasm of uncertain behavior of thyroid gland: Secondary | ICD-10-CM

## 2020-04-11 ENCOUNTER — Telehealth (HOSPITAL_COMMUNITY): Payer: Self-pay | Admitting: Emergency Medicine

## 2020-04-11 ENCOUNTER — Other Ambulatory Visit: Payer: Self-pay | Admitting: Hematology and Oncology

## 2020-04-11 ENCOUNTER — Other Ambulatory Visit (HOSPITAL_COMMUNITY): Payer: Self-pay | Admitting: Emergency Medicine

## 2020-04-11 ENCOUNTER — Telehealth: Payer: Self-pay | Admitting: *Deleted

## 2020-04-11 DIAGNOSIS — I712 Thoracic aortic aneurysm, without rupture, unspecified: Secondary | ICD-10-CM

## 2020-04-11 DIAGNOSIS — C8203 Follicular lymphoma grade I, intra-abdominal lymph nodes: Secondary | ICD-10-CM

## 2020-04-11 NOTE — Telephone Encounter (Signed)
Reaching out to patient to offer assistance regarding upcoming cardiac imaging study; pt verbalizes understanding of appt date/time, parking situation and where to check in, pre-test NPO status and medications ordered, and verified current allergies; name and call back number provided for further questions should they arise Taylor Bond RN Navigator Cardiac Imaging [facility] [phone] office [phone] cell   I explained to patient that his insu (medicare) doesn't require pre-auth. Dorsey (oncology) added the abd/pelv CT. In order to do a formal aortic aneurysm evaluation, I added the CTA chest aorta.  Will be scanned like a TAVR w/ nitro for a coronary eval Ennifer Harston

## 2020-04-11 NOTE — Telephone Encounter (Signed)
Received call from patient stating that he is scheduled for his Chest for cardiology and wants to keep his CT scan of Abdomen and pelvis for Dr. Lorenso Courier on the same day. Advised that there is not an order for CT scan AP yet but that I would have Dr. Lorenso Courier do that. His CT scan chest is on 04/13/20. Advised that he would still need to have CT scan AP authorized and then it could be scheduled. Advised that it would not likley be approved by the 12th. Pt states he will re-schedule tboth scans once authorization has been done. Provided # to radiology scheduling of [phone].  Pt voiced understanding. Order for CT scan AP is now in place per Dr. Lorenso Courier

## 2020-04-11 NOTE — Progress Notes (Signed)
CTA chest aorta added for formal aortic aneurysm evaluation. Will be done in conjunction with coronary CTA and CT abd/pelv.   Marchia Bond RN Navigator Cardiac Imaging [facility] [phone] Office  [phone] Cell

## 2020-04-11 NOTE — Telephone Encounter (Signed)
Pt calling to request an abdominal CT be done at the same time as his coronary CTA which is scheduled on Wednesday 04/13/20.   There is no order for an abd CT and patient has been made aware. Pt requesting to postpone coronary CTA until he can get the additional scans ordered/authorized. So they can be performed together to minimize radiation/contrast exposure ("two birds, one stone" so to speak).  Marchia Bond RN Navigator Cardiac Imaging [facility] [phone] Office  [phone] Cell

## 2020-04-12 ENCOUNTER — Encounter: Payer: Self-pay | Admitting: Gastroenterology

## 2020-04-12 ENCOUNTER — Ambulatory Visit: Payer: Medicare Other | Admitting: Gastroenterology

## 2020-04-12 ENCOUNTER — Ambulatory Visit (INDEPENDENT_AMBULATORY_CARE_PROVIDER_SITE_OTHER): Payer: Medicare Other | Admitting: Gastroenterology

## 2020-04-12 ENCOUNTER — Other Ambulatory Visit: Payer: Self-pay | Admitting: *Deleted

## 2020-04-12 VITALS — BP 122/72 | HR 62 | Ht 68.0 in | Wt 205.0 lb

## 2020-04-12 DIAGNOSIS — K5732 Diverticulitis of large intestine without perforation or abscess without bleeding: Secondary | ICD-10-CM

## 2020-04-12 DIAGNOSIS — Z8719 Personal history of other diseases of the digestive system: Secondary | ICD-10-CM | POA: Diagnosis not present

## 2020-04-12 DIAGNOSIS — Z86010 Personal history of colonic polyps: Secondary | ICD-10-CM

## 2020-04-12 MED ORDER — RIVAROXABAN 20 MG PO TABS: 20.0000 mg | ORAL_TABLET | Freq: Every day | ORAL | 1 refills | Status: DC

## 2020-04-12 MED ORDER — RIVAROXABAN 20 MG PO TABS: 20.0000 mg | ORAL_TABLET | Freq: Every day | ORAL | 5 refills | Status: DC

## 2020-04-12 MED ORDER — ESOMEPRAZOLE MAGNESIUM 20 MG PO CPDR: 20.0000 mg | DELAYED_RELEASE_CAPSULE | Freq: Every day | ORAL | 3 refills | Status: DC

## 2020-04-12 NOTE — Telephone Encounter (Signed)
Pt presented to the office asking for printed Rx's for Xarelto so he can shop around for cheapest price.  Two Rx's given to pt, 1 for 90 day supply and 1 for 30 day supply.

## 2020-04-12 NOTE — Progress Notes (Signed)
Referring Provider: Inda Coke, PA Primary Care Physician:  Inda Coke, [place]  Reason for Consultation:  Abdominal pain   IMPRESSION:  Barrett's Esophagus diagnosed in [place]    -We will obtain prior records    -Continue daily proton pump inhibitor therapy History of colon polyps    -Tubular adenoma on colonoscopy in [place] 2016    -Patient understands surveillance is due at this time Xarelto for DVT in 2020 and 2021 Recent clinically diagnosed diverticulitis 12/21 in the setting of known diverticulosis    -No abdominal imaging performed at that time    -Treated empirically with Cipro and Flagyl  I have recommended holding Xarelto for 2 days before endoscopy.  Given his recent DVT, I expect that he will not be able to hold his Xarelto for at least 3 to 6 months.  I discussed with the patient that there is a low, but real, risk of a cardiovascular event such as heart attack, stroke, or embolism/thrombosis while off Xarelto. Will communicate by phone or EMR with patient's prescribing provider, PA [facility], to confirm that holding the Xarelto is appropriate at this time.   No ongoing symptoms related to diverticulitis. Reviewed recommendations to follow a high fiber diet or to use fiber supplements on a regular basis.  However, there is no need to avoid seeds, corn, berries, and nuts. Using NSAIDs may be associated with a moderately increased risk of occurrence of any episode of diverticulitis and complicated diverticulitis.    PLAN: - Reviewed lifestyle recommendations following an episode of acute diverticulitis - Continue daily Nexium - Obtain prior endoscopy reports, pathology results, and office records from Dr. Augustin Coupe - EGD and colonoscopy after a Xarelto washout -likely later this year - I have asked him to call with any new concerns or questions prior to his endoscopic evaluation  Please see the "Patient Instructions" section for addition details about the plan.  HPI:  Taylor Berger is a 75 y.o. male referred by Inda Coke  He has hypertension, hyperlipidemia, grade 1A follicular lymphoma diagnosed 06/2019, history of diverticulitis, recurrent right lower leg DVT on Xarelto, recent bronchitis requiring steroids.  Recently under evaluation by Dr. Lucia Gaskins for thyroid goiter.  Recently moved from [place]. Previously followed by a gastroenterologist in [place] for 15 years.  Looking to establish care.  He reports at least 4 prior colonoscopies. Most recently, colonoscopy with Dr. Bryna Colander 10/27/2014 in [facility] for history of colon polyps revealed a 3 mm ascending colon tubular adenoma, sigmoid diverticulosis, and small internal hemorrhoids.   History of GERD and Barrett's esophagus diagnosed by Dr. Augustin Coupe. Managed with serial EGDs and proton pump inhibitors, currently on Nexium. [name] thinks the last EGD was 2015. No history of stricture or dilation.  No ongoing heartburn, dysphagia, dysphonia, odynophagia, dyspepsia, change in bowel habits.  Diagnosed with diverticulitis in December on a visit to Urgent Care. Known diverticulosis but not prior diverticulitis.  No abdominal imaging performed at the time of that diagnosis.  Treated with Cipro and Flagyl.  December was also complicated by the development of a new lower extremity DVT requiring that he resume Xarelto, bronchitis requiring steroids, and a thyroid mass.  Having a CT scan tomorrow to follow-up on AAA.   Father with colon polyps. No other known family history of colon cancer or polyps. No family history of uterine/endometrial cancer, pancreatic cancer or gastric/stomach cancer.   Past Medical History:  Diagnosis Date  . Allergy    Seasonal  . Aortic aneurysm ([place])   .  Depression   . DVT (deep venous thrombosis) ([place]) 2020  . GERD (gastroesophageal reflux disease)   . Goiter   . Hyperlipidemia   . Hypertension   . Lymphoma ([place]) 7829   Follicular, stomach  . Neuropathy of both feet      Past Surgical History:  Procedure Laterality Date  . CARPAL TUNNEL RELEASE Left   . CERVICAL DISCECTOMY  1995   C-7  . CERVICAL FUSION  01/1989   C-3 through C-6 / no metal  . COLONOSCOPY    . ELBOW SURGERY Left   . FOOT SURGERY Left 1999   Plantar fascitis  . low back fusion  2013   Lumbar & Low Thoracic with metal  . SHOULDER ARTHROSCOPY WITH ROTATOR CUFF REPAIR Right 2014  . UPPER GASTROINTESTINAL ENDOSCOPY      Current Outpatient Medications  Medication Sig Dispense Refill  . albuterol (VENTOLIN HFA) 108 (90 Base) MCG/ACT inhaler Inhale 1-2 puffs into the lungs every 6 (six) hours as needed for wheezing or shortness of breath. 18 g 0  . amLODipine (NORVASC) 10 MG tablet Take 1 tablet (10 mg total) by mouth daily. 90 tablet 1  . atorvastatin (LIPITOR) 40 MG tablet Take 1 tablet (40 mg total) by mouth daily. 90 tablet 1  . cetirizine (ZYRTEC) 10 MG tablet Take 10 mg by mouth daily.    . clindamycin (CLEOCIN-T) 1 % external solution Apply topically 2 (two) times daily. 30 mL 0  . esomeprazole (NEXIUM) 20 MG capsule Take 20 mg by mouth daily at 12 noon.    . fluticasone (FLONASE) 50 MCG/ACT nasal spray Place into both nostrils as needed for allergies or rhinitis.    Marland Kitchen gabapentin (NEURONTIN) 300 MG capsule Take 1 capsule (300 mg total) by mouth 3 (three) times daily. 270 capsule 1  . lisinopril (ZESTRIL) 5 MG tablet Take 1 tablet (5 mg total) by mouth daily. 90 tablet 1  . RIVAROXABAN (XARELTO) VTE STARTER PACK (15 & 20 MG) Follow package directions: Take one 15mg  tablet by mouth twice a day. On day 22, switch to one 20mg  tablet once a day. Take with food. 51 each 0  . rivaroxaban (XARELTO) 20 MG TABS tablet Take 1 tablet (20 mg total) by mouth daily with supper. (Patient not taking: Reported on 04/12/2020) 90 tablet 0   No current facility-administered medications for this visit.    Allergies as of 04/12/2020  . (No Known Allergies)    Family History  Problem Relation Age  of Onset  . Diabetes Mother   . Hypertension Mother   . Hyperlipidemia Mother   . Heart failure Mother   . Leukemia Father   . Stroke Father   . Esophageal cancer Neg Hx   . Colon polyps Neg Hx   . Pancreatic cancer Neg Hx   . Stomach cancer Neg Hx     Social History   Socioeconomic History  . Marital status: Married    Spouse name: Not on file  . Number of children: Not on file  . Years of education: Not on file  . Highest education level: Not on file  Occupational History  . Not on file  Tobacco Use  . Smoking status: Never Smoker  . Smokeless tobacco: Never Used  Vaping Use  . Vaping Use: Never used  Substance and Sexual Activity  . Alcohol use: Not Currently  . Drug use: Never  . Sexual activity: Not Currently  Other Topics Concern  . Not on file  Social History Narrative   ** Merged History Encounter **       Moved from [place] to be closer to son Married   Social Determinants of Radio broadcast assistant Strain: Not on file  Food Insecurity: Not on file  Transportation Needs: Not on file  Physical Activity: Not on file  Stress: Not on file  Social Connections: Not on file  Intimate Partner Violence: Not on file    Review of Systems: 12 system ROS is negative except as noted above the additions of allergies, back pain, depression, hearing problems, muscle pain, shortness of breath, allergies, and swollen feet.   Physical Exam: General:   Alert,  well-nourished, pleasant and cooperative in NAD Head:  Normocephalic and atraumatic. Eyes:  Sclera clear, no icterus.   Conjunctiva pink. Ears:  Normal auditory acuity. Nose:  No deformity, discharge,  or lesions. Mouth:  No deformity or lesions.   Neck:  Supple; no masses or thyromegaly. Lungs:  Clear throughout to auscultation.   No wheezes. Heart:  Regular rate and rhythm; no murmurs. Abdomen:  Soft, nontender, nondistended, normal bowel sounds, no rebound or guarding. No hepatosplenomegaly.   Rectal:   Deferred to upcoming colonoscopy Msk:  Symmetrical. No boney deformities LAD: No inguinal or umbilical LAD Extremities:  No clubbing or edema. Neurologic:  Alert and  oriented x4;  grossly nonfocal Skin:  Intact without significant lesions or rashes. Psych:  Alert and cooperative. Normal mood and affect.     Nalayah Hitt L. Tarri Glenn, MD, MPH 04/12/2020, 9:18 AM

## 2020-04-12 NOTE — Patient Instructions (Addendum)
We will obtain records from Dr. Augustin Coupe regarding your prior EGDs and colonoscopy.  Given your recent diverticulitis I recommend that you follow a high fiber diet or use fiber supplements on a regular basis. There is no need to avoid seeds, nuts, corn, and berries. Non-steroidal antiinflammatory medications (such as ibuprofen, naproxen, etc) may increase your risk for a recurrent of diverticulitis. Please avoid these medications whenever possible.   PROCEDURE:  I have recommended that you have an endoscopy and a colonoscopy. However, given your recent DVT, it will not be appropriate to schedule your procedures at this time. We will wait 6 months before scheduling your procedures. We did not schedule the procedure(s) today. I plan to see you back this summer.  I would like for you to continue your Nexium.  PRESCRIPTION MEDICATION(S): We have sent the following medication(s) to your pharmacy:  . Nexium - Please take 20mg  by mouth daily  If you are age 55 or older, your body mass index should be between 23-30. Your Body mass index is 31.17 kg/m. If this is out of the aforementioned range listed, please consider follow up with your Primary Care Provider.  Thank you for trusting me with your gastrointestinal care!    Thornton Park, MD, MPH

## 2020-04-13 ENCOUNTER — Ambulatory Visit (HOSPITAL_COMMUNITY): Admission: RE | Admit: 2020-04-13 | Payer: Medicare Other | Source: Ambulatory Visit

## 2020-04-13 ENCOUNTER — Ambulatory Visit (HOSPITAL_COMMUNITY): Payer: Medicare Other

## 2020-04-13 ENCOUNTER — Other Ambulatory Visit: Payer: Self-pay

## 2020-04-13 ENCOUNTER — Ambulatory Visit (HOSPITAL_COMMUNITY)
Admission: RE | Admit: 2020-04-13 | Discharge: 2020-04-13 | Disposition: A | Discharge status: Home / Self Care | Payer: Medicare Other | Source: Ambulatory Visit | Attending: Internal Medicine | Admitting: Internal Medicine

## 2020-04-13 ENCOUNTER — Encounter (HOSPITAL_COMMUNITY): Payer: Self-pay

## 2020-04-13 ENCOUNTER — Telehealth: Payer: Self-pay

## 2020-04-13 DIAGNOSIS — C8203 Follicular lymphoma grade I, intra-abdominal lymph nodes: Secondary | ICD-10-CM | POA: Insufficient documentation

## 2020-04-13 DIAGNOSIS — R072 Precordial pain: Secondary | ICD-10-CM

## 2020-04-13 MED ORDER — IOHEXOL 350 MG/ML SOLN
100.0000 mL | Freq: Once | INTRAVENOUS | Status: AC | PRN
  Administered 2020-04-13: 100 mL via INTRAVENOUS

## 2020-04-13 MED ORDER — NITROGLYCERIN 0.4 MG SL SUBL
0.8000 mg | SUBLINGUAL_TABLET | Freq: Once | SUBLINGUAL | Status: AC
  Administered 2020-04-13: 0.8 mg via SUBLINGUAL

## 2020-04-13 MED ORDER — NITROGLYCERIN 0.4 MG SL SUBL
SUBLINGUAL_TABLET | SUBLINGUAL | Status: AC
  Filled 2020-04-13: qty 2

## 2020-04-13 NOTE — Telephone Encounter (Signed)
FAXED DOCUMENTS  Provider and/or Facility: Dr. Bryna Colander w/ [place] [place]: ROI  ROI has been faxed successfully to Dr. Bryna Colander w/ [facility]. Document(s) and fax confirmation(s) have been placed in the faxed file for future reference.

## 2020-04-13 NOTE — Progress Notes (Signed)
Pt tolerated exam without incident; pt denies lightheadedness or dizziness; pt ambulatory to lobby steady gait noted  Oza Oberle RN Navigator Cardiac [facility] [phone] Office  [phone] Cell  

## 2020-04-14 ENCOUNTER — Ambulatory Visit (HOSPITAL_COMMUNITY)
Admission: RE | Admit: 2020-04-14 | Discharge: 2020-04-14 | Disposition: A | Discharge status: Home / Self Care | Payer: Medicare Other | Source: Ambulatory Visit | Attending: Internal Medicine | Admitting: Internal Medicine

## 2020-04-14 DIAGNOSIS — R072 Precordial pain: Secondary | ICD-10-CM | POA: Insufficient documentation

## 2020-04-14 DIAGNOSIS — R931 Abnormal findings on diagnostic imaging of heart and coronary circulation: Secondary | ICD-10-CM | POA: Diagnosis present

## 2020-04-15 ENCOUNTER — Other Ambulatory Visit: Payer: Self-pay

## 2020-04-15 DIAGNOSIS — R079 Chest pain, unspecified: Secondary | ICD-10-CM

## 2020-04-18 ENCOUNTER — Telehealth: Payer: Self-pay | Admitting: Internal Medicine

## 2020-04-18 NOTE — Telephone Encounter (Signed)
Spoke with patient to schedule the Echo that was ordered by Dr. Kristen Cardinal Friday 04/29/20 at 1:05pm at [address], [address].  Patient voiced his understanding.

## 2020-04-22 ENCOUNTER — Other Ambulatory Visit: Payer: Self-pay

## 2020-04-22 ENCOUNTER — Other Ambulatory Visit: Payer: Medicare Other

## 2020-04-22 ENCOUNTER — Ambulatory Visit (INDEPENDENT_AMBULATORY_CARE_PROVIDER_SITE_OTHER): Payer: Medicare Other | Admitting: Physician Assistant

## 2020-04-22 ENCOUNTER — Encounter: Payer: Self-pay | Admitting: Physician Assistant

## 2020-04-22 VITALS — BP 130/80 | HR 58 | Temp 98.0°F | Ht 68.0 in | Wt 204.2 lb

## 2020-04-22 DIAGNOSIS — I824Y1 Acute embolism and thrombosis of unspecified deep veins of right proximal lower extremity: Secondary | ICD-10-CM

## 2020-04-22 DIAGNOSIS — R361 Hematospermia: Secondary | ICD-10-CM | POA: Diagnosis not present

## 2020-04-22 DIAGNOSIS — N401 Enlarged prostate with lower urinary tract symptoms: Secondary | ICD-10-CM

## 2020-04-22 NOTE — Patient Instructions (Signed)
It was great to see you!  Continue Xarelto as prescribed. Let us know if for some reasons your leg symptoms worsen. It may take a few months for swelling to resolve.  We will check your urine today and call/mychart with results. Referral to [facility] has been placed.  If a referral was placed today, you will be contacted for an appointment. Please note that routine referrals can sometimes take up to 3-4 weeks to process. Please call our office if you haven't heard anything after this time frame.  Take care,  Inda Coke PA-C

## 2020-04-22 NOTE — Progress Notes (Signed)
Taylor Berger is a 75 y.o. male is here for follow up.  I acted as a Education administrator for [employer], PA-C Anselmo Pickler, LPN   History of Present Illness:   Chief Complaint  Patient presents with  . DVT right leg    HPI   DVT  Pt here following up on DVT 03/29/2020. He is still having swelling in right leg and leg cramps. Denies pain, SOB, palpitations. Currently taking xarelto 15 mg BID, about to switch to xarelto 20 mg daily.    Hematospermia Blood in semen on Wednesday and Thursday of this week. Saw urology in the past, about two years ago. Currently not on any medications for his prostate, hx of BPH. Only saw urology x 1 in [place]. Was on proscar but stopped due to being ineffective. Denies any pain, fever, chills, pressure.   Health Maintenance Due  Topic Date Due  . Hepatitis C Screening  Never done  . TETANUS/TDAP  Never done  . PNA vac Low Risk Adult (1 of 2 - PCV13) Never done    Past Medical History:  Diagnosis Date  . Allergy    Seasonal  . Aortic aneurysm ([place])   . Depression   . DVT (deep venous thrombosis) ([place]) 2020  . GERD (gastroesophageal reflux disease)   . Goiter   . Hyperlipidemia   . Hypertension   . Lymphoma ([place]) 4854   Follicular, stomach  . Neuropathy of both feet      Social History   Tobacco Use  . Smoking status: Never Smoker  . Smokeless tobacco: Never Used  Vaping Use  . Vaping Use: Never used  Substance Use Topics  . Alcohol use: Not Currently  . Drug use: Never    Past Surgical History:  Procedure Laterality Date  . CARPAL TUNNEL RELEASE Left   . CERVICAL DISCECTOMY  1995   C-7  . CERVICAL FUSION  01/1989   C-3 through C-6 / no metal  . COLONOSCOPY    . ELBOW SURGERY Left   . FOOT SURGERY Left 1999   Plantar fascitis  . low back fusion  2013   Lumbar & Low Thoracic with metal  . SHOULDER ARTHROSCOPY WITH ROTATOR CUFF REPAIR Right 2014  . UPPER GASTROINTESTINAL ENDOSCOPY      Family History  Problem Relation  Age of Onset  . Diabetes Mother   . Hypertension Mother   . Hyperlipidemia Mother   . Heart failure Mother   . Leukemia Father   . Stroke Father   . Esophageal cancer Neg Hx   . Colon polyps Neg Hx   . Pancreatic cancer Neg Hx   . Stomach cancer Neg Hx     PMHx, SurgHx, SocialHx, FamHx, Medications, and Allergies were reviewed in the Visit Navigator and updated as appropriate.   Patient Active Problem List   Diagnosis Date Noted  . Follicular lymphoma grade I of intra-abdominal lymph nodes ([place]) 03/07/2020  . Hypertension 01/29/2020  . Thoracic aortic aneurysm ([place]) 01/29/2020  . DVT R femoral thigh 01/29/2020  . GERD (gastroesophageal reflux disease) 01/29/2020  . History of Barrett's esophagus 01/29/2020  . Goiter 01/29/2020  . Neuropathy of both feet   . Lymphoma ([place]) 2020    Social History   Tobacco Use  . Smoking status: Never Smoker  . Smokeless tobacco: Never Used  Vaping Use  . Vaping Use: Never used  Substance Use Topics  . Alcohol use: Not Currently  . Drug use: Never  Current Medications and Allergies:    Current Outpatient Medications:  .  albuterol (VENTOLIN HFA) 108 (90 Base) MCG/ACT inhaler, Inhale 1-2 puffs into the lungs every 6 (six) hours as needed for wheezing or shortness of breath., Disp: 18 g, Rfl: 0 .  amLODipine (NORVASC) 10 MG tablet, Take 1 tablet (10 mg total) by mouth daily., Disp: 90 tablet, Rfl: 1 .  atorvastatin (LIPITOR) 40 MG tablet, Take 1 tablet (40 mg total) by mouth daily., Disp: 90 tablet, Rfl: 1 .  cetirizine (ZYRTEC) 10 MG tablet, Take 10 mg by mouth daily., Disp: , Rfl:  .  clindamycin (CLEOCIN-T) 1 % external solution, Apply topically 2 (two) times daily., Disp: 30 mL, Rfl: 0 .  esomeprazole (NEXIUM) 20 MG capsule, Take 1 capsule (20 mg total) by mouth daily at 12 noon., Disp: 90 capsule, Rfl: 3 .  fluticasone (FLONASE) 50 MCG/ACT nasal spray, Place into both nostrils as needed for allergies or rhinitis., Disp: , Rfl:  .   gabapentin (NEURONTIN) 300 MG capsule, Take 1 capsule (300 mg total) by mouth 3 (three) times daily., Disp: 270 capsule, Rfl: 1 .  lisinopril (ZESTRIL) 5 MG tablet, Take 1 tablet (5 mg total) by mouth daily., Disp: 90 tablet, Rfl: 1 .  rivaroxaban (XARELTO) 20 MG TABS tablet, Take 1 tablet (20 mg total) by mouth daily with supper., Disp: 90 tablet, Rfl: 1  No Known Allergies  Review of Systems   ROS  Negative unless otherwise specified per HPI.  Vitals:   Vitals:   04/22/20 1501  BP: 130/80  Pulse: (!) 58  Temp: 98 F (36.7 C)  TempSrc: Temporal  SpO2: 95%  Weight: 204 lb 4 oz (92.6 kg)  Height: 5\' 8"  (1.727 m)     Body mass index is 31.06 kg/m.   Physical Exam:    Physical Exam Vitals and nursing note reviewed.  Constitutional:      General: He is not in acute distress.    Appearance: He is well-developed. He is not ill-appearing, toxic-appearing or sickly-appearing.  Cardiovascular:     Rate and Rhythm: Normal rate and regular rhythm.     Pulses: Normal pulses.     Heart sounds: Normal heart sounds, S1 normal and S2 normal.  Pulmonary:     Effort: Pulmonary effort is normal.     Breath sounds: Normal breath sounds.  Musculoskeletal:     Comments: Continued swelling to RLE without TTP to calf  Skin:    General: Skin is warm, dry and intact.  Neurological:     Mental Status: He is alert.     GCS: GCS eye subscore is 4. GCS verbal subscore is 5. GCS motor subscore is 6.  Psychiatric:        Mood and Affect: Mood and affect normal.        Speech: Speech normal.        Behavior: Behavior normal. Behavior is cooperative.      Assessment and Plan:    Taylor Berger was seen today for dvt right leg.  Diagnoses and all orders for this visit:  Acute deep vein thrombosis (DVT) of proximal vein of right lower extremity (HCC) No worsening sx. Stable, and compliant with xarelto. Continue to monitor, reassurance provided.  Benign prostatic hyperplasia with lower urinary  tract symptoms, symptom details unspecified; Hematospermia Update Urine and culture today. [facility] given BPH and ongoing cancer dx. Will treat culture if indicated. -     Cancel: Urinalysis, Routine w reflex microscopic;  Future -     Cancel: Urine Culture; Future -     Ambulatory referral to Urology -     Urinalysis, Routine w reflex microscopic; Future -     Urine Culture; Future -     Urine Culture -     Urinalysis, Routine w reflex microscopic    CMA or LPN served as scribe during this visit. History, Physical, and Plan performed by medical provider. The above documentation has been reviewed and is accurate and complete.   Inda Coke, PA-C [place] [place] 04/22/2020  Follow-up: No follow-ups on file.

## 2020-04-23 LAB — URINE CULTURE
MICRO NUMBER:: 11443650
Result:: NO GROWTH
SPECIMEN QUALITY:: ADEQUATE

## 2020-04-23 LAB — TIQ-MISC

## 2020-04-25 ENCOUNTER — Ambulatory Visit: Payer: Medicare Other | Admitting: Physician Assistant

## 2020-04-25 ENCOUNTER — Ambulatory Visit
Admission: RE | Admit: 2020-04-25 | Discharge: 2020-04-25 | Disposition: A | Discharge status: Home / Self Care | Payer: Medicare Other | Source: Ambulatory Visit | Attending: Otolaryngology | Admitting: Otolaryngology

## 2020-04-25 DIAGNOSIS — D440 Neoplasm of uncertain behavior of thyroid gland: Secondary | ICD-10-CM

## 2020-04-25 NOTE — Telephone Encounter (Signed)
Records Request - SECOND REQUEST  Provider and/or Facility: Dr. Augustin Coupe  Document: Signed ROI  ROI has again been faxed successfully to Dr. Augustin Coupe. Document(s) and fax confirmation(s) have been placed in the faxed file for future reference.

## 2020-04-29 ENCOUNTER — Ambulatory Visit (HOSPITAL_COMMUNITY): Payer: Medicare Other | Attending: Cardiology

## 2020-04-29 ENCOUNTER — Other Ambulatory Visit: Payer: Self-pay

## 2020-04-29 DIAGNOSIS — R079 Chest pain, unspecified: Secondary | ICD-10-CM | POA: Diagnosis present

## 2020-04-29 LAB — ECHOCARDIOGRAM COMPLETE
Area-P 1/2: 2.74 cm2
S' Lateral: 2.7 cm

## 2020-05-05 NOTE — Progress Notes (Signed)
error 

## 2020-05-05 NOTE — Telephone Encounter (Signed)
Called Dr. Aron Baba office to follow up on status of records. States they were unable to locate records.

## 2020-05-09 ENCOUNTER — Ambulatory Visit: Payer: Medicare Other

## 2020-05-12 ENCOUNTER — Encounter: Payer: Self-pay | Admitting: Internal Medicine

## 2020-05-12 ENCOUNTER — Ambulatory Visit (INDEPENDENT_AMBULATORY_CARE_PROVIDER_SITE_OTHER): Payer: Medicare Other | Admitting: Internal Medicine

## 2020-05-12 ENCOUNTER — Other Ambulatory Visit: Payer: Self-pay

## 2020-05-12 VITALS — BP 120/66 | HR 53 | Ht 68.0 in | Wt 203.8 lb

## 2020-05-12 DIAGNOSIS — I10 Essential (primary) hypertension: Secondary | ICD-10-CM | POA: Diagnosis not present

## 2020-05-12 DIAGNOSIS — E785 Hyperlipidemia, unspecified: Secondary | ICD-10-CM | POA: Diagnosis not present

## 2020-05-12 DIAGNOSIS — I7121 Aneurysm of the ascending aorta, without rupture: Secondary | ICD-10-CM

## 2020-05-12 DIAGNOSIS — I712 Thoracic aortic aneurysm, without rupture: Secondary | ICD-10-CM

## 2020-05-12 DIAGNOSIS — I77819 Aortic ectasia, unspecified site: Secondary | ICD-10-CM | POA: Diagnosis not present

## 2020-05-12 DIAGNOSIS — I2510 Atherosclerotic heart disease of native coronary artery without angina pectoris: Secondary | ICD-10-CM

## 2020-05-12 DIAGNOSIS — R079 Chest pain, unspecified: Secondary | ICD-10-CM

## 2020-05-12 NOTE — Progress Notes (Signed)
Cardiology Office Note:    Date:  01/31/2021   ID:  Taylor Berger, DOB [birth year], MRN [id number]  PCP:  Inda Coke, PA  Cardiologist:  Elouise Munroe, MD  Electrophysiologist:  None   Referring MD: Inda Coke, PA   Chief Complaint/Reason for Referral: HTN, thoracic aortic aneurysm  History of Present Illness:    Taylor Berger is a 75 y.o. male with a history of follicular lymphoma by biopsy March 2021, HTN, thoracic aortic aneurysm measuring 4.2 cm in Feb 2020, DVT, HLD. Presents to establish cardiovascular care.   Discussed moderate CAD on CCTA with normal FFR. Also with borderline dilation of ascending aorta 39 mm at mid ascending aorta and moderately dilated PA. Echo ordered to follow up on dilated PA with no evidence of PHTN.  Recurrent DVT in December, restarted on anticoagulation.   Initial consult: Overall feels well but notes occasional substernal, nonradiating chest discomfort. He does not have a known history of CAD per his report. Takes lisinopril and amlodipine for HTN, reasonable BP control. Takes atorvastatin for HLD, LDL in January 76. Nexium for GERD.   The patient denies dyspnea at rest or with exertion, palpitations, PND, orthopnea, or leg swelling. Denies cough, fever, chills. Denies nausea, vomiting. Denies syncope or presyncope. Denies dizziness or lightheadedness. Denies snoring.   Past Medical History:  Diagnosis Date   Allergy    Seasonal   Aortic aneurysm ([place])    Depression    DVT (deep venous thrombosis) ([place]) 2020   GERD (gastroesophageal reflux disease)    Goiter    Hyperlipidemia    Hypertension    Lymphoma ([place]) 3710   Follicular, stomach   Neuropathy of both feet     Past Surgical History:  Procedure Laterality Date   CARPAL TUNNEL RELEASE Left    CERVICAL DISCECTOMY  1995   C-7   CERVICAL FUSION  01/1989   C-3 through C-6 / no metal   COLONOSCOPY     ELBOW SURGERY Left    FOOT SURGERY Left 1999   Plantar fascitis    low back fusion  2013   Lumbar & Low Thoracic with metal   SHOULDER ARTHROSCOPY WITH ROTATOR CUFF REPAIR Right 2014   UPPER GASTROINTESTINAL ENDOSCOPY      Current Medications: Current Meds  Medication Sig   cetirizine (ZYRTEC) 10 MG tablet Take 10 mg by mouth daily.   clindamycin (CLEOCIN-T) 1 % external solution Apply topically 2 (two) times daily. (Patient taking differently: Apply topically as needed.)   fluticasone (FLONASE) 50 MCG/ACT nasal spray Place into both nostrils as needed for allergies or rhinitis.   [DISCONTINUED] albuterol (VENTOLIN HFA) 108 (90 Base) MCG/ACT inhaler Inhale 1-2 puffs into the lungs every 6 (six) hours as needed for wheezing or shortness of breath.   [DISCONTINUED] amLODipine (NORVASC) 10 MG tablet Take 1 tablet (10 mg total) by mouth daily.   [DISCONTINUED] atorvastatin (LIPITOR) 40 MG tablet Take 1 tablet (40 mg total) by mouth daily.   [DISCONTINUED] esomeprazole (NEXIUM) 20 MG capsule Take 1 capsule (20 mg total) by mouth daily at 12 noon.   [DISCONTINUED] gabapentin (NEURONTIN) 300 MG capsule Take 1 capsule (300 mg total) by mouth 3 (three) times daily.   [DISCONTINUED] lisinopril (ZESTRIL) 5 MG tablet Take 1 tablet (5 mg total) by mouth daily.   [DISCONTINUED] rivaroxaban (XARELTO) 20 MG TABS tablet Take 1 tablet (20 mg total) by mouth daily with supper.     Allergies:   Patient has no known allergies.  Social History   Tobacco Use   Smoking status: Never   Smokeless tobacco: Never  Vaping Use   Vaping Use: Never used  Substance Use Topics   Alcohol use: Not Currently   Drug use: Never     Family History: The patient's family history includes Diabetes in his mother; Heart failure in his mother; Hyperlipidemia in his mother; Hypertension in his mother; Leukemia in his father; Stroke in his father. There is no history of Esophageal cancer, Colon polyps, Pancreatic cancer, Stomach cancer, or Rectal cancer.  ROS:   Please see the history of  present illness.    All other systems reviewed and are negative.  EKGs/Labs/Other Studies Reviewed:    The following studies were reviewed today:  EKG:  Sinus bradycardia, 1st deg AVB, left axis deviation, LVH  Recent Labs: 12/07/2020: ALT 23; BUN 22; Creatinine 1.23; Hemoglobin 14.1; Platelet Count 222; Potassium 4.1; Sodium 142  Recent Lipid Panel    Component Value Date/Time   CHOL 155 11/23/2020 0942   TRIG 138.0 11/23/2020 0942   HDL 40.20 11/23/2020 0942   CHOLHDL 4 11/23/2020 0942   VLDL 27.6 11/23/2020 0942   LDLCALC 87 11/23/2020 0942    Physical Exam:    VS:  BP 120/66    Pulse (!) 53    Ht 5\' 8"  (1.727 m)    Wt 203 lb 12.8 oz (92.4 kg)    BMI 30.99 kg/m     Wt Readings from Last 5 Encounters:  01/04/21 206 lb (93.4 kg)  12/07/20 208 lb 14.4 oz (94.8 kg)  11/29/20 209 lb 9.6 oz (95.1 kg)  11/23/20 204 lb 12.8 oz (92.9 kg)  11/17/20 204 lb (92.5 kg)    Constitutional: No acute distress Eyes: sclera non-icteric, normal conjunctiva and lids ENMT: normal dentition, moist mucous membranes Cardiovascular: regular rhythm, normal rate, no murmurs. S1 and S2 normal. Radial pulses normal bilaterally. No jugular venous distention.  Respiratory: clear to auscultation bilaterally GI : normal bowel sounds, soft and nontender. No distention.   MSK: extremities warm, well perfused. No edema.  NEURO: grossly nonfocal exam, moves all extremities. PSYCH: alert and oriented x 3, normal mood and affect.   ASSESSMENT:    1. Chest pain of uncertain etiology   2. Hyperlipidemia, unspecified hyperlipidemia type   3. Primary hypertension   4. Aneurysm of ascending aorta without rupture   5. Dilation of aorta (HCC)    PLAN:    Borderline aorta dilation - 39 mm. This is likely upper limit of normal for age when indexed to [place]. Unlikely to need routine follow up but will consider echocardiogram in 1 year to follow.   CAD, mild-moderate Chest pain  - no significant recurrence of  CP. Mild-moderate CAD on CCTA. Contiue DOAC for DVT (therefore no ASA for CAD) and continue atorvastatin 40 mg daily.   HTN -continue amlodipine 10 mg daily and lisinopril 5 mg daily. BP well controlled.   HLD - continue atorvastatin 40 mg daily.   Follicular lymphoma - established with heme onc.   DVT - recurrent in 03/30/20, follows with heme onc, recommended to continue indefinite anticoagulation with xarelto 20 mg daily.    Total time of encounter: 30 minutes total time of encounter, including 20 minutes spent in face-to-face patient care on the date of this encounter. This time includes coordination of care and counseling regarding above mentioned problem list. Remainder of non-face-to-face time involved reviewing chart documents/testing relevant to the patient encounter and documentation in the  medical record. I have independently reviewed documentation from referring provider.   Cherlynn Kaiser, MD [place]   [facility]     Medication Adjustments/Labs and Tests Ordered: Current medicines are reviewed at length with the patient today.  Concerns regarding medicines are outlined above.   Orders Placed This Encounter  Procedures   EKG 12-Lead    No orders of the defined types were placed in this encounter.   Patient Instructions  Medication Instructions:  Your physician recommends that you continue on your current medications as directed. Please refer to the Current Medication list given to you today.  *If you need a refill on your cardiac medications before your next appointment, please call your pharmacy*   Follow-Up: At [facility], you and your health needs are our priority.  As part of our continuing mission to provide you with exceptional heart care, we have created designated Provider Care Teams.  These Care Teams include your primary Cardiologist (physician) and Advanced Practice Providers (APPs -  Physician Assistants and Nurse Practitioners) who all work  together to provide you with the care you need, when you need it.  We recommend signing up for the patient portal called "MyChart".  Sign up information is provided on this After Visit Summary.  MyChart is used to connect with patients for Virtual Visits (Telemedicine).  Patients are able to view lab/test results, encounter notes, upcoming appointments, etc.  Non-urgent messages can be sent to your provider as well.   To learn more about what you can do with MyChart, go to [email or web address].    Your next appointment:   6 month(s)  The format for your next appointment:   In Person  Provider:   Cherlynn Kaiser, MD

## 2020-05-12 NOTE — Patient Instructions (Signed)
Medication Instructions:  Your physician recommends that you continue on your current medications as directed. Please refer to the Current Medication list given to you today.  *If you need a refill on your cardiac medications before your next appointment, please call your pharmacy*   Follow-Up: At [facility], you and your health needs are our priority.  As part of our continuing mission to provide you with exceptional heart care, we have created designated Provider Care Teams.  These Care Teams include your primary Cardiologist (physician) and Advanced Practice Providers (APPs -  Physician Assistants and Nurse Practitioners) who all work together to provide you with the care you need, when you need it.  We recommend signing up for the patient portal called "MyChart".  Sign up information is provided on this After Visit Summary.  MyChart is used to connect with patients for Virtual Visits (Telemedicine).  Patients are able to view lab/test results, encounter notes, upcoming appointments, etc.  Non-urgent messages can be sent to your provider as well.   To learn more about what you can do with MyChart, go to [email or web address].    Your next appointment:   6 month(s)  The format for your next appointment:   In Person  Provider:   Gayatri Acharya, MD  

## 2020-05-13 ENCOUNTER — Ambulatory Visit (INDEPENDENT_AMBULATORY_CARE_PROVIDER_SITE_OTHER): Payer: Medicare Other

## 2020-05-13 ENCOUNTER — Other Ambulatory Visit: Payer: Self-pay

## 2020-05-13 VITALS — BP 118/78 | HR 72 | Temp 98.3°F | Resp 20 | Wt 199.8 lb

## 2020-05-13 DIAGNOSIS — Z0000 Encounter for general adult medical examination without abnormal findings: Secondary | ICD-10-CM | POA: Diagnosis not present

## 2020-05-13 NOTE — Progress Notes (Signed)
Subjective:   Taylor Berger is a 75 y.o. male who presents for an Initial Medicare Annual Wellness Visit.  Review of Systems     Cardiac Risk Factors include: advanced age (>38men, >14 women);hypertension;male gender;obesity (BMI >30kg/m2)     Objective:    Today's Vitals   05/13/20 1111  BP: 118/78  Pulse: 72  Resp: 20  Temp: 98.3 F (36.8 C)  SpO2: 93%  Weight: 199 lb 12.8 oz (90.6 kg)   Body mass index is 30.38 kg/m.  Advanced Directives 05/13/2020 03/10/2020 03/08/2020  Does Patient Have a Medical Advance Directive? No No No  Would patient like information on creating a medical advance directive? No - Patient declined No - Patient declined -    Current Medications (verified) Outpatient Encounter Medications as of 05/13/2020  Medication Sig  . albuterol (VENTOLIN HFA) 108 (90 Base) MCG/ACT inhaler Inhale 1-2 puffs into the lungs every 6 (six) hours as needed for wheezing or shortness of breath.  Marland Kitchen amLODipine (NORVASC) 10 MG tablet Take 1 tablet (10 mg total) by mouth daily.  Marland Kitchen atorvastatin (LIPITOR) 40 MG tablet Take 1 tablet (40 mg total) by mouth daily.  . cetirizine (ZYRTEC) 10 MG tablet Take 10 mg by mouth daily.  . clindamycin (CLEOCIN-T) 1 % external solution Apply topically 2 (two) times daily.  Marland Kitchen esomeprazole (NEXIUM) 20 MG capsule Take 1 capsule (20 mg total) by mouth daily at 12 noon.  . fluticasone (FLONASE) 50 MCG/ACT nasal spray Place into both nostrils as needed for allergies or rhinitis.  Marland Kitchen gabapentin (NEURONTIN) 300 MG capsule Take 1 capsule (300 mg total) by mouth 3 (three) times daily.  Marland Kitchen lisinopril (ZESTRIL) 5 MG tablet Take 1 tablet (5 mg total) by mouth daily.  . rivaroxaban (XARELTO) 20 MG TABS tablet Take 1 tablet (20 mg total) by mouth daily with supper.   No facility-administered encounter medications on file as of 05/13/2020.    Allergies (verified) Patient has no known allergies.   History: Past Medical History:  Diagnosis Date  . Allergy     Seasonal  . Aortic aneurysm ([place])   . Depression   . DVT (deep venous thrombosis) ([place]) 2020  . GERD (gastroesophageal reflux disease)   . Goiter   . Hyperlipidemia   . Hypertension   . Lymphoma ([place]) 8469   Follicular, stomach  . Neuropathy of both feet    Past Surgical History:  Procedure Laterality Date  . CARPAL TUNNEL RELEASE Left   . CERVICAL DISCECTOMY  1995   C-7  . CERVICAL FUSION  01/1989   C-3 through C-6 / no metal  . COLONOSCOPY    . ELBOW SURGERY Left   . FOOT SURGERY Left 1999   Plantar fascitis  . low back fusion  2013   Lumbar & Low Thoracic with metal  . SHOULDER ARTHROSCOPY WITH ROTATOR CUFF REPAIR Right 2014  . UPPER GASTROINTESTINAL ENDOSCOPY     Family History  Problem Relation Age of Onset  . Diabetes Mother   . Hypertension Mother   . Hyperlipidemia Mother   . Heart failure Mother   . Leukemia Father   . Stroke Father   . Esophageal cancer Neg Hx   . Colon polyps Neg Hx   . Pancreatic cancer Neg Hx   . Stomach cancer Neg Hx    Social History   Socioeconomic History  . Marital status: Married    Spouse name: Not on file  . Number of children: Not on file  .  Years of education: Not on file  . Highest education level: Not on file  Occupational History  . Occupation: retired   Tobacco Use  . Smoking status: Never Smoker  . Smokeless tobacco: Never Used  Vaping Use  . Vaping Use: Never used  Substance and Sexual Activity  . Alcohol use: Not Currently  . Drug use: Never  . Sexual activity: Not Currently  Other Topics Concern  . Not on file  Social History Narrative   ** Merged History Encounter **       Moved from [facility] to be closer to son Married   Social Determinants of Health   Financial Resource Strain: Low Risk   . Difficulty of Paying Living Expenses: Not hard at all  Food Insecurity: No Food Insecurity  . Worried About Charity fundraiser in the Last Year: Never true  . Ran Out of Food in the Last Year: Never true   Transportation Needs: No Transportation Needs  . Lack of Transportation (Medical): No  . Lack of Transportation (Non-Medical): No  Physical Activity: Inactive  . Days of Exercise per Week: 0 days  . Minutes of Exercise per Session: 0 min  Stress: No Stress Concern Present  . Feeling of Stress : Not at all  Social Connections: Moderately Isolated  . Frequency of Communication with Friends and Family: Three times a week  . Frequency of Social Gatherings with Friends and Family: Once a week  . Attends Religious Services: Never  . Active Member of Clubs or Organizations: No  . Attends Archivist Meetings: Never  . Marital Status: Married    Tobacco Counseling Counseling given: Not Answered   Clinical Intake:  Pre-visit preparation completed: Yes  Pain : No/denies pain     BMI - recorded: 30.38 Nutritional Status: BMI > 30  Obese Nutritional Risks: None Diabetes: No  How often do you need to have someone help you when you read instructions, pamphlets, or other written materials from your doctor or pharmacy?: 1 - Never  Diabetic?No  Interpreter Needed?: No  Information entered by :: Charlott Rakes, LPN   Activities of Daily Living In your present state of health, do you have any difficulty performing the following activities: 05/13/2020  Hearing? Y  Comment HOH  Vision? N  Difficulty concentrating or making decisions? Y  Comment short term memory  Walking or climbing stairs? Y  Dressing or bathing? N  Doing errands, shopping? N  Preparing Food and eating ? N  Using the Toilet? N  In the past six months, have you accidently leaked urine? Y  Comment urgency  Do you have problems with loss of bowel control? N  Managing your Medications? N  Managing your Finances? N  Housekeeping or managing your Housekeeping? N  Some recent data might be hidden    Patient Care Team: Inda Coke, [place] as PCP - General (Physician Assistant)  Indicate any recent  Medical Services you may have received from other than [facility] providers in the past year (date may be approximate).     Assessment:   This is a routine wellness examination for [employer].  Hearing/Vision screen  Hearing Screening   125Hz  250Hz  500Hz  1000Hz  2000Hz  3000Hz  4000Hz  6000Hz  8000Hz   Right ear:           Left ear:           Comments: Pt states hard of hearing   Vision Screening Comments: Pt follows up with Dr Laban Emperor for annual eye exams  Dietary issues and exercise activities discussed: Current Exercise Habits: The patient does not participate in regular exercise at present  Goals    . Patient Stated     Lose weight      Depression Screen PHQ 2/9 Scores 05/13/2020 01/29/2020  PHQ - 2 Score 0 3  PHQ- 9 Score - 10    Fall Risk Fall Risk  05/13/2020  Falls in the past year? 1  Number falls in past yr: 1  Injury with Fall? 0  Risk for fall due to : Impaired vision;Impaired balance/gait  Follow up Falls prevention discussed    FALL RISK PREVENTION PERTAINING TO THE HOME:  Any stairs in or around the home? Yes  If so, are there any without handrails? No  Home free of loose throw rugs in walkways, pet beds, electrical cords, etc? Yes  Adequate lighting in your home to reduce risk of falls? Yes   ASSISTIVE DEVICES UTILIZED TO PREVENT FALLS:  Life alert? No  Use of a cane, walker or w/c? No  Grab bars in the bathroom? Yes  Shower chair or bench in shower? No  Elevated toilet seat or a handicapped toilet? No   TIMED UP AND GO:  Was the test performed? Yes .  Length of time to ambulate 10 feet: 10 sec.   Gait steady and fast without use of assistive device  Cognitive Function:     6CIT Screen 05/13/2020  What Year? 0 points  What month? 0 points  Count back from 20 0 points  Months in reverse 0 points  Repeat phrase 2 points    Immunizations Immunization History  Administered Date(s) Administered  . Influenza, High Dose Seasonal PF 01/13/2020  . Moderna  Sars-Covid-2 Vaccination 05/20/2019, 06/17/2019  . PFIZER(Purple Top)SARS-COV-2 Vaccination 01/26/2020  . Pneumococcal Conjugate-13 01/06/2014  . Tdap 12/10/2015  . Zoster Recombinat (Shingrix) 01/27/2019    TDAP status: Up to date  Flu Vaccine status: Up to date Done 01/13/20  Pneumococcal vaccine status: Due, Education has been provided regarding the importance of this vaccine. Advised may receive this vaccine at local pharmacy or Health Dept. Aware to provide a copy of the vaccination record if obtained from local pharmacy or Health Dept. Verbalized acceptance and understanding.  Covid-19 vaccine status: Completed vaccines  Qualifies for Shingles Vaccine? Yes   Zostavax completed No   Shingrix Completed?: No.    Education has been provided regarding the importance of this vaccine. Patient has been advised to call insurance company to determine out of pocket expense if they have not yet received this vaccine. Advised may also receive vaccine at local pharmacy or Health Dept. Verbalized acceptance and understanding.  Screening Tests Health Maintenance  Topic Date Due  . Hepatitis C Screening  Never done  . PNA vac Low Risk Adult (2 of 2 - PPSV23) 01/07/2015  . COVID-19 Vaccine (4 - Booster) 07/26/2020  . TETANUS/TDAP  12/09/2025  . INFLUENZA VACCINE  Completed    Health Maintenance  Health Maintenance Due  Topic Date Due  . Hepatitis C Screening  Never done  . PNA vac Low Risk Adult (2 of 2 - PPSV23) 01/07/2015    Colorectal cancer screening: No longer required.   Additional Screening:  Hepatitis C Screening: does qualify;   Vision Screening: Recommended annual ophthalmology exams for early detection of glaucoma and other disorders of the eye. Is the patient up to date with their annual eye exam?  Yes  Who is the provider or what is the  name of the office in which the patient attends annual eye exams? [facility]  If pt is not established with a provider, would they like  to be referred to a provider to establish care? No .   Dental Screening: Recommended annual dental exams for proper oral hygiene  Community Resource Referral / Chronic Care Management: CRR required this visit?  No   CCM required this visit?  No      Plan:     I have personally reviewed and noted the following in the patient's chart:   . Medical and social history . Use of alcohol, tobacco or illicit drugs  . Current medications and supplements . Functional ability and status . Nutritional status . Physical activity . Advanced directives . List of other physicians . Hospitalizations, surgeries, and ER visits in previous 12 months . Vitals . Screenings to include cognitive, depression, and falls . Referrals and appointments  In addition, I have reviewed and discussed with patient certain preventive protocols, quality metrics, and best practice recommendations. A written personalized care plan for preventive services as well as general preventive health recommendations were provided to patient.     Willette Brace, LPN   [id number]   Nurse Notes: None

## 2020-05-13 NOTE — Patient Instructions (Addendum)
[name] , Thank you for taking time to come for your Medicare Wellness Visit. I appreciate your ongoing commitment to your health goals. Please review the following plan we discussed and let me know if I can assist you in the future.   Screening recommendations/referrals: Colonoscopy: No longer required  Recommended yearly ophthalmology/optometry visit for glaucoma screening and checkup Recommended yearly dental visit for hygiene and checkup  Vaccinations: Influenza vaccine: Done 01/13/20 Up to date Pneumococcal vaccine: pcv 13 done 01/07/15 Tdap vaccine: Up to date Shingles vaccine: Shingrix discussed. Please contact your pharmacy for coverage information.   1st dose 01/27/19 Covid-19: Completed 2/17, 3/17, & 01/26/20  Advanced directives: Please bring a copy of your health care power of attorney and living will to the office at your convenience.  Conditions/risks identified: Lose weight   Next appointment: Follow up in one year for your annual wellness visit.   Preventive Care 14 Years and Older, Male Preventive care refers to lifestyle choices and visits with your health care provider that can promote health and wellness. What does preventive care include?  A yearly physical exam. This is also called an annual well check.  Dental exams once or twice a year.  Routine eye exams. Ask your health care provider how often you should have your eyes checked.  Personal lifestyle choices, including:  Daily care of your teeth and gums.  Regular physical activity.  Eating a healthy diet.  Avoiding tobacco and drug use.  Limiting alcohol use.  Practicing safe sex.  Taking low doses of aspirin every day.  Taking vitamin and mineral supplements as recommended by your health care provider. What happens during an annual well check? The services and screenings done by your health care provider during your annual well check will depend on your age, overall health, lifestyle risk  factors, and family history of disease. Counseling  Your health care provider may ask you questions about your:  Alcohol use.  Tobacco use.  Drug use.  Emotional well-being.  Home and relationship well-being.  Sexual activity.  Eating habits.  History of falls.  Memory and ability to understand (cognition).  Work and work Statistician. Screening  You may have the following tests or measurements:  Height, weight, and BMI.  Blood pressure.  Lipid and cholesterol levels. These may be checked every 5 years, or more frequently if you are over 24 years old.  Skin check.  Lung cancer screening. You may have this screening every year starting at age 36 if you have a 30-pack-year history of smoking and currently smoke or have quit within the past 15 years.  Fecal occult blood test (FOBT) of the stool. You may have this test every year starting at age 46.  Flexible sigmoidoscopy or colonoscopy. You may have a sigmoidoscopy every 5 years or a colonoscopy every 10 years starting at age 65.  Prostate cancer screening. Recommendations will vary depending on your family history and other risks.  Hepatitis C blood test.  Hepatitis B blood test.  Sexually transmitted disease (STD) testing.  Diabetes screening. This is done by checking your blood sugar (glucose) after you have not eaten for a while (fasting). You may have this done every 1-3 years.  Abdominal aortic aneurysm (AAA) screening. You may need this if you are a current or former smoker.  Osteoporosis. You may be screened starting at age 23 if you are at high risk. Talk with your health care provider about your test results, treatment options, and if necessary, the  need for more tests. Vaccines  Your health care provider may recommend certain vaccines, such as:  Influenza vaccine. This is recommended every year.  Tetanus, diphtheria, and acellular pertussis (Tdap, Td) vaccine. You may need a Td booster every 10  years.  Zoster vaccine. You may need this after age 60.  Pneumococcal 13-valent conjugate (PCV13) vaccine. One dose is recommended after age 61.  Pneumococcal polysaccharide (PPSV23) vaccine. One dose is recommended after age 12. Talk to your health care provider about which screenings and vaccines you need and how often you need them. This information is not intended to replace advice given to you by your health care provider. Make sure you discuss any questions you have with your health care provider. Document Released: 04/15/2015 Document Revised: 12/07/2015 Document Reviewed: 01/18/2015 Elsevier Interactive Patient Education  2017 [place] Prevention in the Home Falls can cause injuries. They can happen to people of all ages. There are many things you can do to make your home safe and to help prevent falls. What can I do on the outside of my home?  Regularly fix the edges of walkways and driveways and fix any cracks.  Remove anything that might make you trip as you walk through a door, such as a raised step or threshold.  Trim any bushes or trees on the path to your home.  Use bright outdoor lighting.  Clear any walking paths of anything that might make someone trip, such as rocks or tools.  Regularly check to see if handrails are loose or broken. Make sure that both sides of any steps have handrails.  Any raised decks and porches should have guardrails on the edges.  Have any leaves, snow, or ice cleared regularly.  Use sand or salt on walking paths during winter.  Clean up any spills in your garage right away. This includes oil or grease spills. What can I do in the bathroom?  Use night lights.  Install grab bars by the toilet and in the tub and shower. Do not use towel bars as grab bars.  Use non-skid mats or decals in the tub or shower.  If you need to sit down in the shower, use a plastic, non-slip stool.  Keep the floor dry. Clean up any water that  spills on the floor as soon as it happens.  Remove soap buildup in the tub or shower regularly.  Attach bath mats securely with double-sided non-slip rug tape.  Do not have throw rugs and other things on the floor that can make you trip. What can I do in the bedroom?  Use night lights.  Make sure that you have a light by your bed that is easy to reach.  Do not use any sheets or blankets that are too big for your bed. They should not hang down onto the floor.  Have a firm chair that has side arms. You can use this for support while you get dressed.  Do not have throw rugs and other things on the floor that can make you trip. What can I do in the kitchen?  Clean up any spills right away.  Avoid walking on wet floors.  Keep items that you use a lot in easy-to-reach places.  If you need to reach something above you, use a strong step stool that has a grab bar.  Keep electrical cords out of the way.  Do not use floor polish or wax that makes floors slippery. If you must use wax,  use non-skid floor wax.  Do not have throw rugs and other things on the floor that can make you trip. What can I do with my stairs?  Do not leave any items on the stairs.  Make sure that there are handrails on both sides of the stairs and use them. Fix handrails that are broken or loose. Make sure that handrails are as long as the stairways.  Check any carpeting to make sure that it is firmly attached to the stairs. Fix any carpet that is loose or worn.  Avoid having throw rugs at the top or bottom of the stairs. If you do have throw rugs, attach them to the floor with carpet tape.  Make sure that you have a light switch at the top of the stairs and the bottom of the stairs. If you do not have them, ask someone to add them for you. What else can I do to help prevent falls?  Wear shoes that:  Do not have high heels.  Have rubber bottoms.  Are comfortable and fit you well.  Are closed at the  toe. Do not wear sandals.  If you use a stepladder:  Make sure that it is fully opened. Do not climb a closed stepladder.  Make sure that both sides of the stepladder are locked into place.  Ask someone to hold it for you, if possible.  Clearly mark and make sure that you can see:  Any grab bars or handrails.  First and last steps.  Where the edge of each step is.  Use tools that help you move around (mobility aids) if they are needed. These include:  Canes.  Walkers.  Scooters.  Crutches.  Turn on the lights when you go into a dark area. Replace any light bulbs as soon as they burn out.  Set up your furniture so you have a clear path. Avoid moving your furniture around.  If any of your floors are uneven, fix them.  If there are any pets around you, be aware of where they are.  Review your medicines with your doctor. Some medicines can make you feel dizzy. This can increase your chance of falling. Ask your doctor what other things that you can do to help prevent falls. This information is not intended to replace advice given to you by your health care provider. Make sure you discuss any questions you have with your health care provider. Document Released: 01/13/2009 Document Revised: 08/25/2015 Document Reviewed: 04/23/2014 Elsevier Interactive Patient Education  2017 [employer].

## 2020-05-17 ENCOUNTER — Ambulatory Visit (INDEPENDENT_AMBULATORY_CARE_PROVIDER_SITE_OTHER): Payer: Medicare Other | Admitting: Otolaryngology

## 2020-05-18 ENCOUNTER — Other Ambulatory Visit: Payer: Self-pay

## 2020-05-18 ENCOUNTER — Ambulatory Visit (INDEPENDENT_AMBULATORY_CARE_PROVIDER_SITE_OTHER): Payer: Medicare Other | Admitting: Physician Assistant

## 2020-05-18 ENCOUNTER — Encounter: Payer: Self-pay | Admitting: Physician Assistant

## 2020-05-18 VITALS — BP 110/70 | HR 56 | Temp 97.9°F | Ht 68.0 in | Wt 199.2 lb

## 2020-05-18 DIAGNOSIS — M25561 Pain in right knee: Secondary | ICD-10-CM

## 2020-05-18 DIAGNOSIS — M25562 Pain in left knee: Secondary | ICD-10-CM

## 2020-05-18 NOTE — Patient Instructions (Signed)
It was great to see you!  Please schedule and appointment with one of our physical therapists Ander Purpura or Antony Haste) to be evaluated.  Voltaren Gel is available over the counter.  You are to apply this gel to your injured body part twice daily (morning and evening).   A little goes a long way so you can use about a pea-sized amount for each area.  ? Spread this small amount over the area into a thin film and let it dry.  ? Be sure that you do not rub the gel into your skin for more than 10 or 15 seconds otherwise it can irritate you skin.   ? Once you apply the gel, please do not put any other lotion or clothing in contact with that area for 30 minutes to allow the gel to absorb into your skin.   Some people are sensitive to the medication and can develop a sunburn-like rash.  If you have only mild symptoms it is okay to continue to use the medication but if you have any breakdown of your skin you should discontinue its use and please let us know.      Take care,  Inda Coke PA-C

## 2020-05-18 NOTE — Progress Notes (Signed)
Taylor Berger is a 75 y.o. male here for a new problem.  I acted as a Education administrator for [employer], PA-C Anselmo Pickler, LPN   History of Present Illness:   Chief Complaint  Patient presents with  . Knee Pain    HPI   Knee pain Pt c/o bilateral knee pain, started off and on x 3-4 months, has worsened the past 10 days. Taking Tylenol at night with minimal relief. He has neuropathy in his feet ever since back surgery in 2013. He reports that his wife has noticed that he has altered his gait and is walking "like a duck" which he feels is due to compensate for bilateral neuropathy in feet. Getting medial knee pain consistently with walking. Has ordered compression bandages but have not arrived yet. Denies: buckling, weakness.   Past Medical History:  Diagnosis Date  . Allergy    Seasonal  . Aortic aneurysm ([place])   . Depression   . DVT (deep venous thrombosis) ([place]) 2020  . GERD (gastroesophageal reflux disease)   . Goiter   . Hyperlipidemia   . Hypertension   . 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 Neuropathy of both feet      Social History   Tobacco Use  . Smoking status: Never Smoker  . Smokeless tobacco: Never Used  Vaping Use  . Vaping Use: Never used  Substance Use Topics  . Alcohol use: Not Currently  . Drug use: Never    Past Surgical History:  Procedure Laterality Date  . CARPAL TUNNEL RELEASE Left   . CERVICAL DISCECTOMY  1995   C-7  . CERVICAL FUSION  01/1989   C-3 through C-6 / no metal  . COLONOSCOPY    . ELBOW SURGERY Left   . FOOT SURGERY Left 1999   Plantar fascitis  . low back fusion  2013   Lumbar & Low Thoracic with metal  . SHOULDER ARTHROSCOPY WITH ROTATOR CUFF REPAIR Right 2014  . UPPER GASTROINTESTINAL ENDOSCOPY      Family History  Problem Relation Age of Onset  . Diabetes Mother   . Hypertension Mother   . Hyperlipidemia Mother   . Heart failure Mother   . Leukemia Father   . Stroke Father   . Esophageal cancer Neg Hx    . Colon polyps Neg Hx   . Pancreatic cancer Neg Hx   . Stomach cancer Neg Hx     No Known Allergies  Current Medications:   Current Outpatient Medications:  .  albuterol (VENTOLIN HFA) 108 (90 Base) MCG/ACT inhaler, Inhale 1-2 puffs into the lungs every 6 (six) hours as needed for wheezing or shortness of breath., Disp: 18 g, Rfl: 0 .  amLODipine (NORVASC) 10 MG tablet, Take 1 tablet (10 mg total) by mouth daily., Disp: 90 tablet, Rfl: 1 .  atorvastatin (LIPITOR) 40 MG tablet, Take 1 tablet (40 mg total) by mouth daily., Disp: 90 tablet, Rfl: 1 .  cetirizine (ZYRTEC) 10 MG tablet, Take 10 mg by mouth daily., Disp: , Rfl:  .  clindamycin (CLEOCIN-T) 1 % external solution, Apply topically 2 (two) times daily., Disp: 30 mL, Rfl: 0 .  esomeprazole (NEXIUM) 20 MG capsule, Take 1 capsule (20 mg total) by mouth daily at 12 noon., Disp: 90 capsule, Rfl: 3 .  fluticasone (FLONASE) 50 MCG/ACT nasal spray, Place into both nostrils as needed for allergies or rhinitis., Disp: , Rfl:  .  gabapentin (NEURONTIN) 300 MG capsule, Take 1 capsule (300 mg  total) by mouth 3 (three) times daily., Disp: 270 capsule, Rfl: 1 .  lisinopril (ZESTRIL) 5 MG tablet, Take 1 tablet (5 mg total) by mouth daily., Disp: 90 tablet, Rfl: 1 .  rivaroxaban (XARELTO) 20 MG TABS tablet, Take 1 tablet (20 mg total) by mouth daily with supper., Disp: 90 tablet, Rfl: 1   Review of Systems:   ROS Negative unless otherwise specified per HPI.  Vitals:   Vitals:   05/18/20 1114  BP: 110/70  Pulse: (!) 56  Temp: 97.9 F (36.6 C)  TempSrc: Temporal  SpO2: 97%  Weight: 199 lb 4 oz (90.4 kg)  Height: 5\' 8"  (1.727 m)     Body mass index is 30.3 kg/m.  Physical Exam:   Physical Exam Vitals and nursing note reviewed.  Constitutional:      Appearance: He is well-developed and well-nourished.  HENT:     Head: Normocephalic.  Eyes:     Extraocular Movements: EOM normal.     Conjunctiva/sclera: Conjunctivae normal.      Pupils: Pupils are equal, round, and reactive to light.  Pulmonary:     Effort: Pulmonary effort is normal.  Musculoskeletal:        General: Normal range of motion.     Cervical back: Normal range of motion.     Comments: Bilateral lateral knee joint line tenderness No swelling or decreased ROM appreciated  Skin:    General: Skin is warm and dry.  Neurological:     Mental Status: He is alert and oriented to person, place, and time.  Psychiatric:        Mood and Affect: Mood and affect normal.        Behavior: Behavior normal.        Thought Content: Thought content normal.        Judgment: Judgment normal.     Assessment and Plan:   Taylor Berger was seen today for knee pain.  Diagnoses and all orders for this visit:  Acute pain of both knees -     Ambulatory referral to Physical Therapy   No red flags on exam. Will trial topical diclofenac to area BID. Referral to PT. Follow-up if no improvement or any changes.  CMA or LPN served as scribe during this visit. History, Physical, and Plan performed by medical provider. The above documentation has been reviewed and is accurate and complete.   Inda Coke, PA-C

## 2020-05-23 ENCOUNTER — Encounter (INDEPENDENT_AMBULATORY_CARE_PROVIDER_SITE_OTHER): Payer: Self-pay | Admitting: Otolaryngology

## 2020-05-23 ENCOUNTER — Other Ambulatory Visit: Payer: Self-pay

## 2020-05-23 ENCOUNTER — Ambulatory Visit (INDEPENDENT_AMBULATORY_CARE_PROVIDER_SITE_OTHER): Payer: Medicare Other | Admitting: Physical Therapy

## 2020-05-23 ENCOUNTER — Encounter: Payer: Self-pay | Admitting: Physical Therapy

## 2020-05-23 ENCOUNTER — Other Ambulatory Visit (INDEPENDENT_AMBULATORY_CARE_PROVIDER_SITE_OTHER): Payer: Self-pay

## 2020-05-23 ENCOUNTER — Ambulatory Visit (INDEPENDENT_AMBULATORY_CARE_PROVIDER_SITE_OTHER): Payer: Medicare Other | Admitting: Otolaryngology

## 2020-05-23 VITALS — Temp 97.2°F

## 2020-05-23 DIAGNOSIS — M25662 Stiffness of left knee, not elsewhere classified: Secondary | ICD-10-CM

## 2020-05-23 DIAGNOSIS — M25562 Pain in left knee: Secondary | ICD-10-CM | POA: Diagnosis not present

## 2020-05-23 DIAGNOSIS — E041 Nontoxic single thyroid nodule: Secondary | ICD-10-CM

## 2020-05-23 DIAGNOSIS — M25671 Stiffness of right ankle, not elsewhere classified: Secondary | ICD-10-CM | POA: Diagnosis not present

## 2020-05-23 DIAGNOSIS — M25561 Pain in right knee: Secondary | ICD-10-CM | POA: Diagnosis not present

## 2020-05-23 DIAGNOSIS — M25661 Stiffness of right knee, not elsewhere classified: Secondary | ICD-10-CM

## 2020-05-23 DIAGNOSIS — D440 Neoplasm of uncertain behavior of thyroid gland: Secondary | ICD-10-CM

## 2020-05-23 DIAGNOSIS — M25672 Stiffness of left ankle, not elsewhere classified: Secondary | ICD-10-CM

## 2020-05-23 NOTE — Patient Instructions (Signed)
Access Code: [email or web address] URL: [email or web address] Date: 05/23/2020 Prepared by: Daleen Bo  Exercises Supine Active Straight Leg Raise - 2 x daily - 7 x weekly - 3 sets - 10 reps Standing Bilateral Heel Raise on Step - 2 x daily - 7 x weekly - 2 sets - 10 reps - 3 hold

## 2020-05-23 NOTE — Therapy (Addendum)
[place], [place], [address] Phone: [phone]   Fax:  [phone]  Physical Therapy Evaluation  Patient Details  Name: Taylor Berger MRN: [id number] Date of Birth: 1945/07/31 Referring Provider (PT): Inda Coke, [place]   Encounter Date: 05/23/2020   PT End of Session - 05/23/20 1256    Visit Number 1    Number of Visits 13    Date for PT Re-Evaluation 06/22/20    Authorization Type Medicare Part A and B    PT Start Time 0800    PT Stop Time 0850    PT Time Calculation (min) 50 min    Activity Tolerance Patient tolerated treatment well    Behavior During Therapy [facility] for tasks assessed/performed           Past Medical History:  Diagnosis Date  . Allergy    Seasonal  . Aortic aneurysm ([place])   . Depression   . DVT (deep venous thrombosis) ([place]) 2020  . GERD (gastroesophageal reflux disease)   . Goiter   . Hyperlipidemia   . Hypertension   . Lymphoma ([place]) 4287   Follicular, stomach  . Neuropathy of both feet     Past Surgical History:  Procedure Laterality Date  . CARPAL TUNNEL RELEASE Left   . CERVICAL DISCECTOMY  1995   C-7  . CERVICAL FUSION  01/1989   C-3 through C-6 / no metal  . COLONOSCOPY    . ELBOW SURGERY Left   . FOOT SURGERY Left 1999   Plantar fascitis  . low back fusion  2013   Lumbar & Low Thoracic with metal  . SHOULDER ARTHROSCOPY WITH ROTATOR CUFF REPAIR Right 2014  . UPPER GASTROINTESTINAL ENDOSCOPY      There were no vitals filed for this visit.    Subjective Assessment - 05/23/20 0807    Subjective Pt states the knee pain started about 3 weeks ago. He moved from [facility] from a 1 story home to a 3 story townhome. He has had more medial knee pain. Pt endorses history of neuropathy that greatly affects his balance. Loss of sensation goes up about mid shin. He feels that he is very careful in order to aovid falls due to loss of proprioception and feeling in his feet.  He  feels the walking duck footed walking down the stairs puts more pressure on the inside of the knee. Pt states he is putting bilat handrails on the stairs. He feels that "if he loses his horizon, he will have vertigo." He denies room spinning and states it is moreso LOB. Pt states he has been hesistant to use Voltaren gel bc of his cats at home. He is currently using compression bandages on his knees for the past few days. He feels they might help. Pt states he feels that his LE are weak and the toe out walking puts more stress on his knees. Pt denies acute swelling, redness, and warmth to touch. Pt denies instability, crepitus, catching/locking. Pt denies red flags for cancer.    Limitations Sitting;Lifting;Standing;Walking;House hold activities    Patient Stated Goals Pt states he would like to return to daily activity without pain.    Currently in Pain? Yes    Pain Score 4     Pain Location Knee    Pain Orientation Left;Right    Pain Descriptors / Indicators Aching;Sore;Sharp    Pain Type Acute pain    Pain Onset 1 to 4 weeks ago  Pain Frequency Intermittent    Aggravating Factors  walking downstairs, walking long distances    Pain Relieving Factors patches, knee compression bandages, resting    Multiple Pain Sites No              [facility] PT Assessment - 05/23/20 0001      Assessment   Medical Diagnosis Bilateral knee pain    Referring Provider (PT) Inda Coke, PA    Onset Date/Surgical Date 05/02/20    Prior Therapy N/A      Precautions   Precautions None      Restrictions   Weight Bearing Restrictions No      Balance Screen   Has the patient fallen in the past 6 months Yes    How many times? 1   slipped on recent ice   Has the patient had a decrease in activity level because of a fear of falling?  No    Is the patient reluctant to leave their home because of a fear of falling?  No      Home Ecologist residence      Prior Function    Level of Independence Independent      Cognition   Overall Cognitive Status Within Functional Limits for tasks assessed      Observation/Other Assessments   Other Surveys  Lower Extremity Functional Scale    Lower Extremity Functional Scale  46.3%      Sensation   Light Touch Impaired by gross assessment   up to midshin     Functional Tests   Functional tests Squat;Step down;Sit to Stand      Squat   Comments toe out, decreased ankle ROM      Step Down   Comments toe out, decreased eccentric strength during lowering, hip ER position, bilat UE support      Sit to Stand   Comments 5xSTS 13.35s      ROM / Strength   AROM / PROM / Strength AROM;PROM;Strength      AROM   AROM Assessment Site Knee;Ankle    Right/Left Knee Right;Left    Right Knee Extension 0    Right Knee Flexion 125    Left Knee Extension 0    Left Knee Flexion 120    Right/Left Ankle Right;Left    Right Ankle Dorsiflexion 0    Left Ankle Dorsiflexion 0      PROM   Overall PROM Comments decreased end range knee flexion, firm/springy end feel with OP    PROM Assessment Site Knee;Ankle    Right/Left Ankle Right;Left    Right Ankle Dorsiflexion 5    Left Ankle Dorsiflexion 9      Strength   Strength Assessment Site Knee;Hip    Right/Left Hip Right;Left    Right Hip ABduction 4/5    Left Hip ABduction 4/5    Right/Left Knee Right;Left    Right Knee Flexion 4/5    Right Knee Extension 4/5    Left Knee Flexion 4/5    Left Knee Extension 4/5      Flexibility   Soft Tissue Assessment /Muscle Length yes    Hamstrings 90/90 >25 deg deficit    Quadriceps positive Ely's      Palpation   Patella mobility decreased medial glide    Palpation comment TTP bilat medial joint line, unable to see knee due to pants, hypertonicity of bilat quads      Special Tests    Special Tests  Knee Special Tests    Other special tests Neg varus/valgus, neg ant drawer    Knee Special tests  --      Ambulation/Gait    Ambulation/Gait Yes    Gait Pattern Decreased step length - right;Decreased step length - left;Decreased stride length;Decreased dorsiflexion - right;Decreased dorsiflexion - left;Wide base of support    Stairs Yes    Stair Management Technique Alternating pattern   toe/foot out with descent                     Objective measurements completed on examination: See above findings.       [facility] Adult PT Treatment/Exercise - 05/23/20 0001      Exercises   Exercises Knee/Hip;Ankle      Knee/Hip Exercises: Supine   Straight Leg Raises Right;Left;2 sets;10 reps      Ankle Exercises: Standing   Heel Raises Both;20 reps    Heel Raises Limitations off step, 3s hold                  PT Education - 05/23/20 1256    Education Details Diagnosis, prognosis, POC, MOI, anatomy, progression of exercise, biomechanics of hip and ankle on knee position    Person(s) Educated Patient    Methods Explanation;Verbal cues;Handout    Comprehension Verbalized understanding;Returned demonstration            PT Short Term Goals - 05/23/20 1502      PT SHORT TERM GOAL #1   Title Pt will be independent with HEP in order to demonstrate synthesis of PT education.    Time 2    Period Weeks    Status New      PT SHORT TERM GOAL #2   Title Pt will report no pain with stair climbing/descending in order to demonstrate functional improvement in frontal knee control and ankle ROM.    Time 4    Period Weeks             PT Long Term Goals - 05/23/20 1503      PT LONG TERM GOAL #1   Title Pt will demonstrate at least 9 points or greater improvement in LEFS in order to demonstrate clinically significant improvement in LE function.    Time 6    Period Weeks    Status New      PT LONG TERM GOAL #2   Title Pt will demonstrate step up and step down with no pain as well as report ability to traverse stairs and community without knee pain in order to demonstrate functional return to  PLOF.    Time 8    Period Weeks    Status New                  Plan - 05/23/20 1457    Clinical Impression Statement Pt is a 75 y.o. male presenting to PT eval today for cc of bilateral knee pain. Pt presents with decreased knee ROM, decreased knee and hip strength, decreased ankle ROM, impaired motor control with stairs, and decreased balance. Pt's s/s are consistent with medial knee pain due to overuse related injury and dynamic valgus stress. Pt's recent move to home with stairs, ROM restrictions, and knee extensor strength deficits currently contribute to his current level of impairment. Pt would benefit from continued skilled therapy in order to maximize functional mobility and LE strength for return to PLOF.    Personal Factors and Comorbidities Age;Comorbidity 1;Fitness  Examination-Activity Limitations Lift;Squat;Carry;Stand;Bend;Sleep;Dressing    Examination-Participation Restrictions Interpersonal Relationship;Yard Work;Community Activity    Stability/Clinical Decision Making Stable/Uncomplicated    Clinical Decision Making Low    Rehab Potential Good    PT Frequency 2x / week    PT Duration 8 weeks    PT Treatment/Interventions ADLs/Self Care Home Management;Aquatic Therapy;Biofeedback;Iontophoresis 4mg /ml Dexamethasone;Electrical Stimulation;Moist Heat;Traction;Gait training;Stair training;Functional mobility training;Therapeutic activities;Therapeutic exercise;Balance training;Neuromuscular re-education;Patient/family education;Manual techniques;Passive range of motion;Dry needling;Taping;Joint Manipulations;Spinal Manipulations    PT Next Visit Plan Review HEP, prone quad stretch, bridge with band, lateral step up    Consulted and Agree with Plan of Care Patient           Patient will benefit from skilled therapeutic intervention in order to improve the following deficits and impairments:  Abnormal gait,Decreased balance,Decreased endurance,Decreased  mobility,Difficulty walking,Increased muscle spasms,Impaired sensation,Decreased activity tolerance,Decreased strength,Impaired flexibility,Decreased range of motion,Postural dysfunction  Visit Diagnosis: Acute pain of both knees - Plan: PT plan of care cert/re-cert  Knee joint stiffness, bilateral - Plan: PT plan of care cert/re-cert  Ankle joint stiffness, bilateral - Plan: PT plan of care cert/re-cert     Problem List Patient Active Problem List   Diagnosis Date Noted  . Follicular lymphoma grade I of intra-abdominal lymph nodes ([place]) 03/07/2020  . Hypertension 01/29/2020  . Thoracic aortic aneurysm ([place]) 01/29/2020  . DVT R femoral thigh 01/29/2020  . GERD (gastroesophageal reflux disease) 01/29/2020  . History of Barrett's esophagus 01/29/2020  . Goiter 01/29/2020  . Neuropathy of both feet   . Lymphoma [facility]) 2020   Daleen Bo PT, DPT 05/23/20 4:39 PM   [place] [address] [place], [place], [address] Phone: [phone]   Fax:  [phone]  Name: Taylor Berger MRN: [id number] Date of Birth: 1945/10/28

## 2020-05-23 NOTE — Progress Notes (Signed)
HPI: Taylor Berger is a 75 y.o. male who returns today for evaluation of thyroid goiter.  This had been previously followed while he lived down in [place] with repeat ultrasounds but never had a biopsy.  Initial ultrasound performed here demonstrated 2 nodules in the right thyroid lobe with a 1 nodule meeting T RADS 5 which met criteria for fine-needle aspirate biopsy.  The upper nodule measuring 1.5 cm in size did not meet criteria the lower mid nodule measuring 1.9 cm met criteria for biopsy. Patient was also diagnosed with lymphoma about a year ago and has scheduled follow-up appointment with oncologist here concerning further evaluation and treatment.  Past Medical History:  Diagnosis Date  . Allergy    Seasonal  . Aortic aneurysm ([place])   . Depression   . DVT (deep venous thrombosis) ([place]) 2020  . GERD (gastroesophageal reflux disease)   . Goiter   . Hyperlipidemia   . Hypertension   . Lymphoma ([place]) 2536   Follicular, stomach  . Neuropathy of both feet    Past Surgical History:  Procedure Laterality Date  . CARPAL TUNNEL RELEASE Left   . CERVICAL DISCECTOMY  1995   C-7  . CERVICAL FUSION  01/1989   C-3 through C-6 / no metal  . COLONOSCOPY    . ELBOW SURGERY Left   . FOOT SURGERY Left 1999   Plantar fascitis  . low back fusion  2013   Lumbar & Low Thoracic with metal  . SHOULDER ARTHROSCOPY WITH ROTATOR CUFF REPAIR Right 2014  . UPPER GASTROINTESTINAL ENDOSCOPY     Social History   Socioeconomic History  . Marital status: Married    Spouse name: Not on file  . Number of children: Not on file  . Years of education: Not on file  . Highest education level: Not on file  Occupational History  . Occupation: retired   Tobacco Use  . Smoking status: Never Smoker  . Smokeless tobacco: Never Used  Vaping Use  . Vaping Use: Never used  Substance and Sexual Activity  . Alcohol use: Not Currently  . Drug use: Never  . Sexual activity: Not Currently  Other Topics Concern   . Not on file  Social History Narrative   ** Merged History Encounter **       Moved from [facility] to be closer to son Married   Social Determinants of Health   Financial Resource Strain: Low Risk   . Difficulty of Paying Living Expenses: Not hard at all  Food Insecurity: No Food Insecurity  . Worried About Charity fundraiser in the Last Year: Never true  . Ran Out of Food in the Last Year: Never true  Transportation Needs: No Transportation Needs  . Lack of Transportation (Medical): No  . Lack of Transportation (Non-Medical): No  Physical Activity: Inactive  . Days of Exercise per Week: 0 days  . Minutes of Exercise per Session: 0 min  Stress: No Stress Concern Present  . Feeling of Stress : Not at all  Social Connections: Moderately Isolated  . Frequency of Communication with Friends and Family: Three times a week  . Frequency of Social Gatherings with Friends and Family: Once a week  . Attends Religious Services: Never  . Active Member of Clubs or Organizations: No  . Attends Archivist Meetings: Never  . Marital Status: Married   Family History  Problem Relation Age of Onset  . Diabetes Mother   . Hypertension Mother   .  Hyperlipidemia Mother   . Heart failure Mother   . Leukemia Father   . Stroke Father   . Esophageal cancer Neg Hx   . Colon polyps Neg Hx   . Pancreatic cancer Neg Hx   . Stomach cancer Neg Hx    No Known Allergies Prior to Admission medications   Medication Sig Start Date End Date Taking? Authorizing Provider  albuterol (VENTOLIN HFA) 108 (90 Base) MCG/ACT inhaler Inhale 1-2 puffs into the lungs every 6 (six) hours as needed for wheezing or shortness of breath. 03/19/20   Jaynee Eagles, PA-C  amLODipine (NORVASC) 10 MG tablet Take 1 tablet (10 mg total) by mouth daily. 02/24/20   Inda Coke, PA  atorvastatin (LIPITOR) 40 MG tablet Take 1 tablet (40 mg total) by mouth daily. 02/24/20   Inda Coke, PA  cetirizine (ZYRTEC) 10 MG  tablet Take 10 mg by mouth daily.    [provider]  clindamycin (CLEOCIN-T) 1 % external solution Apply topically 2 (two) times daily. 02/05/20   Inda Coke, PA  esomeprazole (NEXIUM) 20 MG capsule Take 1 capsule (20 mg total) by mouth daily at 12 noon. 04/12/20   Thornton Park, MD  fluticasone (FLONASE) 50 MCG/ACT nasal spray Place into both nostrils as needed for allergies or rhinitis.    [provider]  gabapentin (NEURONTIN) 300 MG capsule Take 1 capsule (300 mg total) by mouth 3 (three) times daily. 02/24/20   Inda Coke, PA  lisinopril (ZESTRIL) 5 MG tablet Take 1 tablet (5 mg total) by mouth daily. 02/24/20   Inda Coke, PA  rivaroxaban (XARELTO) 20 MG TABS tablet Take 1 tablet (20 mg total) by mouth daily with supper. 04/12/20   Inda Coke, PA     Positive ROS: Otherwise negative  All other systems have been reviewed and were otherwise negative with the exception of those mentioned in the HPI and as above.  Physical Exam: Constitutional: Alert, well-appearing, no acute distress Ears: External ears without lesions or tenderness. Ear canals are clear bilaterally with intact, clear TMs.  Nasal: External nose without lesions.. Clear nasal passages Oral: Lips and gums without lesions. Tongue and palate mucosa without lesions. Posterior oropharynx clear. Neck: No palpable adenopathy or masses.  It is difficult to adequately feel the right thyroid nodules.  Of note patient has a well-healed scar from anterior cervical fusion in this area.  He has no palpable adenopathy along the jugular chain of lymph nodes or supraclavicular region. Respiratory: Breathing comfortably  Skin: No facial/neck lesions or rash noted.  Procedures  Assessment: Right thyroid nodules with 1 meeting criteria for FNA.  Plan: Patient is scheduled for FNA of the right mid thyroid nodule. He will call us back concerning results of the FNA 4 to 5 days post  procedure.   Radene Journey, MD

## 2020-05-26 ENCOUNTER — Other Ambulatory Visit: Payer: Self-pay

## 2020-05-26 ENCOUNTER — Ambulatory Visit (INDEPENDENT_AMBULATORY_CARE_PROVIDER_SITE_OTHER): Payer: Medicare Other | Admitting: Physical Therapy

## 2020-05-26 ENCOUNTER — Encounter: Payer: Self-pay | Admitting: Physical Therapy

## 2020-05-26 DIAGNOSIS — M25561 Pain in right knee: Secondary | ICD-10-CM

## 2020-05-26 DIAGNOSIS — M25661 Stiffness of right knee, not elsewhere classified: Secondary | ICD-10-CM | POA: Diagnosis not present

## 2020-05-26 DIAGNOSIS — M25671 Stiffness of right ankle, not elsewhere classified: Secondary | ICD-10-CM

## 2020-05-26 DIAGNOSIS — M25672 Stiffness of left ankle, not elsewhere classified: Secondary | ICD-10-CM

## 2020-05-26 DIAGNOSIS — M25662 Stiffness of left knee, not elsewhere classified: Secondary | ICD-10-CM

## 2020-05-26 DIAGNOSIS — M25562 Pain in left knee: Secondary | ICD-10-CM | POA: Diagnosis not present

## 2020-05-26 NOTE — Patient Instructions (Signed)
Access Code: [email or web address] URL: [email or web address] Date: 05/26/2020 Prepared by: Daleen Bo  Exercises Prone Quadriceps Stretch with Strap - 2 x daily - 7 x weekly - 1 sets - 3 reps Sit to Stand Without Arm Support - 2 x daily - 7 x weekly - 2 sets - 10 reps

## 2020-05-26 NOTE — Therapy (Addendum)
[place], [place], [address] Phone: [phone]   Fax:  [phone]  Physical Therapy Evaluation  Patient Details  Name: Taylor Berger MRN: [id number] Date of Birth: 07-04-1945 Referring Provider (PT): Inda Coke, [place]   Encounter Date: 05/26/2020   PT End of Session - 05/26/20 3762    Visit Number 2    Number of Visits 13    Date for PT Re-Evaluation 06/22/20    Authorization Type Medicare Part A and B    PT Start Time 0845    PT Stop Time 0930    PT Time Calculation (min) 45 min    Activity Tolerance Patient tolerated treatment well    Behavior During Therapy [facility] for tasks assessed/performed           Past Medical History:  Diagnosis Date  . Allergy    Seasonal  . Aortic aneurysm ([place])   . Depression   . DVT (deep venous thrombosis) ([place]) 2020  . GERD (gastroesophageal reflux disease)   . Goiter   . Hyperlipidemia   . Hypertension   . Lymphoma ([place]) 8315   Follicular, stomach  . Neuropathy of both feet     Past Surgical History:  Procedure Laterality Date  . CARPAL TUNNEL RELEASE Left   . CERVICAL DISCECTOMY  1995   C-7  . CERVICAL FUSION  01/1989   C-3 through C-6 / no metal  . COLONOSCOPY    . ELBOW SURGERY Left   . FOOT SURGERY Left 1999   Plantar fascitis  . low back fusion  2013   Lumbar & Low Thoracic with metal  . SHOULDER ARTHROSCOPY WITH ROTATOR CUFF REPAIR Right 2014  . UPPER GASTROINTESTINAL ENDOSCOPY      There were no vitals filed for this visit.    Subjective Assessment - 05/26/20 0844  Pt states that the knee pain has improved with walking up and down stairs. He reports no issues with HEP or HEP increasing pain.   Limitations Sitting;Lifting;Standing;Walking;House hold activities    Patient Stated Goals Pt states he would like to return to daily activity without pain.    Currently in Pain? Yes    Pain Score 3     Pain Location Knee    Pain Orientation  Right;Left    Pain Descriptors / Indicators Aching;Sore;Sharp    Pain Onset 1 to 4 weeks ago                          Objective measurements completed on examination: See above findings.       [place] Adult PT Treatment/Exercise - 05/26/20 0001      Ambulation/Gait   Ambulation/Gait Yes    Gait Pattern Decreased step length - right;Decreased step length - left;Decreased stride length;Decreased dorsiflexion - right;Decreased dorsiflexion - left;Wide base of support    Stairs Yes    Stair Management Technique Alternating pattern   toe/foot out with descent     Exercises   Exercises Knee/Hip;Ankle      Knee/Hip Exercises: Stretches   Sports administrator 30 seconds;3 reps    Sports administrator Limitations 30s 3x      Knee/Hip Exercises: Standing   Functional Squat 20 reps    Functional Squat Limitations 2x10 STS knee height      Knee/Hip Exercises: Supine   Straight Leg Raises Right;Left;2 sets;10 reps      Manual Therapy   Manual Therapy Soft tissue mobilization;Joint mobilization  Joint Mobilization ankle post TC glide grade III bilat    Soft tissue mobilization rec fem and VL bilat      Ankle Exercises: Stretches   Soleus Stretch Limitations 30s 3x wall    Gastroc Stretch Limitations 30s 3x wall      Ankle Exercises: Standing   Heel Raises Both;20 reps    Heel Raises Limitations off step, 3s hold                  PT Education - 05/26/20 0925    Education Details anatomy, progression of exercise, biomechanics of hip and ankle on knee position, gait pattern    Person(s) Educated Patient    Methods Explanation;Demonstration;Tactile cues;Verbal cues;Handout    Comprehension Verbalized understanding;Returned demonstration            PT Short Term Goals - 05/23/20 1502      PT SHORT TERM GOAL #1   Title Pt will be independent with HEP in order to demonstrate synthesis of PT education.    Time 2    Period Weeks    Status New      PT SHORT TERM GOAL  #2   Title Pt will report no pain with stair climbing/descending in order to demonstrate functional improvement in frontal knee control and ankle ROM.    Time 4    Period Weeks             PT Long Term Goals - 05/23/20 1503      PT LONG TERM GOAL #1   Title Pt will demonstrate at least 9 points or greater improvement in LEFS in order to demonstrate clinically significant improvement in LE function.    Time 6    Period Weeks    Status New      PT LONG TERM GOAL #2   Title Pt will demonstrate step up and step down with no pain as well as report ability to traverse stairs and community without knee pain in order to demonstrate functional return to PLOF.    Time 8    Period Weeks    Status New                  Plan - 05/26/20 1275    Clinical Impression Statement Pt had increased soft tissue extensibility with quadriceps and gastroc soleus after manual therapy and exercise.Pt required increased VC and TC for proper heel strike and toe off in order to reduce medial tensile stress, especially with stairs. Pt had difficulty with maintaining quadriceps contraction during SLR. Pt was able to progress to squatting without knee pain after quadriceps stretching. Pt had significant improvement with ankle DF ROM after stretching and stair heel raise exercise. Pt would benefit from continued skilled therapy in order to maximize functional mobility and LE strength for return to PLOF.   Personal Factors and Comorbidities Age;Comorbidity 1;Fitness    Examination-Activity Limitations Lift;Squat;Carry;Stand;Bend;Sleep;Dressing    Examination-Participation Restrictions Interpersonal Relationship;Yard Work;Community Activity    Stability/Clinical Decision Making Stable/Uncomplicated    Rehab Potential Good    PT Frequency 2x / week    PT Duration 8 weeks    PT Treatment/Interventions ADLs/Self Care Home Management;Aquatic Therapy;Biofeedback;Iontophoresis 4mg /ml Dexamethasone;Electrical  Stimulation;Moist Heat;Traction;Gait training;Stair training;Functional mobility training;Therapeutic activities;Therapeutic exercise;Balance training;Neuromuscular re-education;Patient/family education;Manual techniques;Passive range of motion;Dry needling;Taping;Joint Manipulations;Spinal Manipulations    PT Next Visit Plan Review HEP, prone quad stretch, bridge with band, lateral step up    Consulted and Agree with Plan of Care Patient  Patient will benefit from skilled therapeutic intervention in order to improve the following deficits and impairments:  Abnormal gait,Decreased balance,Decreased endurance,Decreased mobility,Difficulty walking,Increased muscle spasms,Impaired sensation,Decreased activity tolerance,Decreased strength,Impaired flexibility,Decreased range of motion,Postural dysfunction  Visit Diagnosis: Acute pain of both knees  Knee joint stiffness, bilateral  Ankle joint stiffness, bilateral     Problem List Patient Active Problem List   Diagnosis Date Noted  . Follicular lymphoma grade I of intra-abdominal lymph nodes ([place]) 03/07/2020  . Hypertension 01/29/2020  . Thoracic aortic aneurysm ([place]) 01/29/2020  . DVT R femoral thigh 01/29/2020  . GERD (gastroesophageal reflux disease) 01/29/2020  . History of Barrett's esophagus 01/29/2020  . Goiter 01/29/2020  . Neuropathy of both feet   . Lymphoma [facility]) 2020   Daleen Bo PT, DPT 05/26/20 9:57 AM   [facility] [address] [place], [place], [address] Phone: [phone]   Fax:  [phone]  Name: Aroldo Galli MRN: [id number] Date of Birth: 09/10/1945

## 2020-05-30 ENCOUNTER — Encounter: Payer: Self-pay | Admitting: Physical Therapy

## 2020-05-30 ENCOUNTER — Ambulatory Visit (INDEPENDENT_AMBULATORY_CARE_PROVIDER_SITE_OTHER): Payer: Medicare Other | Admitting: Physical Therapy

## 2020-05-30 ENCOUNTER — Encounter: Payer: Medicare Other | Admitting: Physical Therapy

## 2020-05-30 ENCOUNTER — Other Ambulatory Visit: Payer: Self-pay

## 2020-05-30 DIAGNOSIS — M25562 Pain in left knee: Secondary | ICD-10-CM | POA: Diagnosis not present

## 2020-05-30 DIAGNOSIS — M25662 Stiffness of left knee, not elsewhere classified: Secondary | ICD-10-CM

## 2020-05-30 DIAGNOSIS — M25671 Stiffness of right ankle, not elsewhere classified: Secondary | ICD-10-CM | POA: Diagnosis not present

## 2020-05-30 DIAGNOSIS — M25561 Pain in right knee: Secondary | ICD-10-CM | POA: Diagnosis not present

## 2020-05-30 DIAGNOSIS — M25661 Stiffness of right knee, not elsewhere classified: Secondary | ICD-10-CM | POA: Diagnosis not present

## 2020-05-30 DIAGNOSIS — M25672 Stiffness of left ankle, not elsewhere classified: Secondary | ICD-10-CM

## 2020-05-30 NOTE — Therapy (Addendum)
[place], [place], [address] Phone: [phone]   Fax:  [phone]  Physical Therapy Discharge  Patient Details  Name: Taylor Berger MRN: [id number] Date of Birth: 01-25-1946 Referring Provider (PT): Inda Coke, [place]   Encounter Date: 05/30/2020   PT End of Session - 05/30/20 1006    Visit Number 3    Number of Visits 13    Date for PT Re-Evaluation 06/22/20    Authorization Type Medicare Part A and B    PT Start Time 0930    PT Stop Time 1015    PT Time Calculation (min) 45 min    Activity Tolerance Patient tolerated treatment well    Behavior During Therapy [facility] for tasks assessed/performed           Past Medical History:  Diagnosis Date  . Allergy    Seasonal  . Aortic aneurysm ([place])   . Depression   . DVT (deep venous thrombosis) ([place]) 2020  . GERD (gastroesophageal reflux disease)   . Goiter   . Hyperlipidemia   . Hypertension   . Lymphoma ([place]) 4401   Follicular, stomach  . Neuropathy of both feet     Past Surgical History:  Procedure Laterality Date  . CARPAL TUNNEL RELEASE Left   . CERVICAL DISCECTOMY  1995   C-7  . CERVICAL FUSION  01/1989   C-3 through C-6 / no metal  . COLONOSCOPY    . ELBOW SURGERY Left   . FOOT SURGERY Left 1999   Plantar fascitis  . low back fusion  2013   Lumbar & Low Thoracic with metal  . SHOULDER ARTHROSCOPY WITH ROTATOR CUFF REPAIR Right 2014  . UPPER GASTROINTESTINAL ENDOSCOPY      There were no vitals filed for this visit.   Subjective Assessment - 05/30/20 0937    Subjective Pt states the pain is a little worse today than before but he does not know why it would have changed. He stated that it started over the weekend one day. He reports the he is having some "sciatica" related pained today. He states it happens to him on occasion. He states he has had no problem with HEP at home.    Limitations Sitting;Lifting;Standing;Walking;House hold  activities    Patient Stated Goals Pt states he would like to return to daily activity without pain.    Currently in Pain? Yes    Pain Score 4     Pain Location Knee    Pain Orientation Left;Right    Pain Descriptors / Indicators Aching;Sore    Pain Onset 1 to 4 weeks ago    Aggravating Factors  stairs, deep squatting    Pain Relieving Factors resting, heat    Multiple Pain Sites Yes    Pain Score 1    Pain Location Hip    Pain Orientation Left    Pain Descriptors / Indicators Tingling    Pain Type Acute pain    Pain Onset In the past 7 days    Pain Frequency Intermittent                             [facility] Adult PT Treatment/Exercise - 05/30/20 0001      Ambulation/Gait   Ambulation/Gait Yes    Gait Pattern Decreased step length - right;Decreased step length - left;Decreased stride length;Decreased dorsiflexion - right;Decreased dorsiflexion - left;Wide base of support    Stairs Yes  Stair Management Technique Alternating pattern   toe/foot out with descent     Exercises   Exercises Knee/Hip;Ankle      Knee/Hip Exercises: Stretches   Sports administrator 30 seconds;3 reps    Sports administrator Limitations 30s 3x      Knee/Hip Exercises: Standing   Functional Squat 20 reps    Functional Squat Limitations 2x10 STS knee height      Knee/Hip Exercises: Supine   Bridges 2 sets;10 reps    Straight Leg Raises Right;Left;2 sets;10 reps      Manual Therapy   Manual Therapy Soft tissue mobilization;Joint mobilization    Joint Mobilization ankle post TC glide grade III bilat    Soft tissue mobilization rec fem and VL bilat      Ankle Exercises: Stretches   Soleus Stretch Limitations 30s 3x wall    Gastroc Stretch Limitations 30s 3x wall      Ankle Exercises: Standing   Heel Raises Both;20 reps    Heel Raises Limitations off step, 3s hold                  PT Education - 05/30/20 1006    Education Details progression of exercise, biomechanics of hip and  ankle on knee position, gait pattern, HEP update    Person(s) Educated Patient    Methods Explanation;Demonstration;Tactile cues;Verbal cues;Handout    Comprehension Verbalized understanding;Returned demonstration            PT Short Term Goals - 05/23/20 1502      PT SHORT TERM GOAL #1   Title Pt will be independent with HEP in order to demonstrate synthesis of PT education.    Time 2    Period Weeks    Status Not Met     PT SHORT TERM GOAL #2   Title Pt will report no pain with stair climbing/descending in order to demonstrate functional improvement in frontal knee control and ankle ROM.    Time 4    Period Not Met            PT Long Term Goals - 05/23/20 1503      PT LONG TERM GOAL #1   Title Pt will demonstrate at least 9 points or greater improvement in LEFS in order to demonstrate clinically significant improvement in LE function.    Time 6    Period Weeks    Status Not Met     PT LONG TERM GOAL #2   Title Pt will demonstrate step up and step down with no pain as well as report ability to traverse stairs and community without knee pain in order to demonstrate functional return to PLOF.    Time 8    Period Weeks    Status Not Met                Plan - 05/30/20 1006    Clinical Impression Statement Pt presented with increased bilateral quadriceps tone at beginning of today's session. Pt responded well and had increased soft tissue extensibility and knee flexion ROM after manual therapy. Pt had particular difficulty with fixing quad dominant squatting pattern. After VC and TC for hip hinging and hip extension, pt reported decrease in knee pain. Pt required increased cuing for decreased anterior knee translation, but was able to return demo by the end of session. To be reviewed at next session.  Pt would benefit from continued skilled therapy in order to maximize functional mobility and LE strength for return to PLOF.  Personal Factors and Comorbidities  Age;Comorbidity 1;Fitness    Examination-Activity Limitations Lift;Squat;Carry;Stand;Bend;Sleep;Dressing    Examination-Participation Restrictions Interpersonal Relationship;Yard Work;Community Activity    Stability/Clinical Decision Making Stable/Uncomplicated    Rehab Potential Good    PT Frequency 2x / week    PT Duration 8 weeks    PT Treatment/Interventions ADLs/Self Care Home Management;Aquatic Therapy;Biofeedback;Iontophoresis 67m/ml Dexamethasone;Electrical Stimulation;Moist Heat;Traction;Gait training;Stair training;Functional mobility training;Therapeutic activities;Therapeutic exercise;Balance training;Neuromuscular re-education;Patient/family education;Manual techniques;Passive range of motion;Dry needling;Taping;Joint Manipulations;Spinal Manipulations    PT Next Visit Plan Review previous HEP, bridge with band, standing hip ABD with band, quad foam roll    Consulted and Agree with Plan of Care Patient           Patient will benefit from skilled therapeutic intervention in order to improve the following deficits and impairments:  Abnormal gait,Decreased balance,Decreased endurance,Decreased mobility,Difficulty walking,Increased muscle spasms,Impaired sensation,Decreased activity tolerance,Decreased strength,Impaired flexibility,Decreased range of motion,Postural dysfunction  Visit Diagnosis: Acute pain of both knees  Knee joint stiffness, bilateral  Ankle joint stiffness, bilateral     Problem List Patient Active Problem List   Diagnosis Date Noted  . Follicular lymphoma grade I of intra-abdominal lymph nodes ([place] 03/07/2020  . Hypertension 01/29/2020  . Thoracic aortic aneurysm ([place] 01/29/2020  . DVT R femoral thigh 01/29/2020  . GERD (gastroesophageal reflux disease) 01/29/2020  . History of Barrett's esophagus 01/29/2020  . Goiter 01/29/2020  . Neuropathy of both feet   . Lymphoma ([facility] 2020   ADaleen BoPT, DPT 05/30/20 12:24 PM   [place] [address]Phone: [phone]  Fax:  [phone] Name: Taylor AzevedoMRN: [id number] of Birth: 103-16-1947   PHYSICAL THERAPY DISCHARGE SUMMARY  Visits from Start of Care: 3  Plan:                                                    Patient goals were not met. Patient is being discharged due to not returning since the last visit.  ?????        ADaleen BoPT, DPT 07/27/20 3:14 PM

## 2020-05-30 NOTE — Patient Instructions (Addendum)
Access Code: [email or web address] URL: [email or web address] Date: 05/30/2020 Prepared by: Daleen Bo  Exercises Prone Quadriceps Stretch with Strap - 2 x daily - 7 x weekly - 1 sets - 3 reps - 30 hold Soleus Stretch on Wall - 2 x daily - 7 x weekly - 1 sets - 3 reps - 30 hold Gastroc Stretch on Wall - 2 x daily - 7 x weekly - 1 sets - 3 reps - 30 hold Heel Raise on Step - 2 x daily - 7 x weekly - 2 sets - 10 reps - 3 hold Supine Active Straight Leg Raise - 2 x daily - 7 x weekly - 2 sets - 10 reps Sit to Stand Without Arm Support - 2 x daily - 7 x weekly - 2 sets - 10 reps Supine Bridge - 2 x daily - 7 x weekly - 2 sets - 10 reps

## 2020-05-31 ENCOUNTER — Ambulatory Visit
Admission: RE | Admit: 2020-05-31 | Discharge: 2020-05-31 | Disposition: A | Discharge status: Home / Self Care | Payer: Medicare Other | Source: Ambulatory Visit | Attending: Otolaryngology | Admitting: Otolaryngology

## 2020-05-31 ENCOUNTER — Other Ambulatory Visit: Payer: Self-pay | Admitting: Hematology and Oncology

## 2020-05-31 ENCOUNTER — Other Ambulatory Visit (HOSPITAL_COMMUNITY)
Admission: RE | Admit: 2020-05-31 | Discharge: 2020-05-31 | Disposition: A | Discharge status: Home / Self Care | Payer: Medicare Other | Source: Ambulatory Visit | Attending: Student | Admitting: Student

## 2020-05-31 DIAGNOSIS — D440 Neoplasm of uncertain behavior of thyroid gland: Secondary | ICD-10-CM

## 2020-05-31 DIAGNOSIS — C8203 Follicular lymphoma grade I, intra-abdominal lymph nodes: Secondary | ICD-10-CM

## 2020-05-31 DIAGNOSIS — E041 Nontoxic single thyroid nodule: Secondary | ICD-10-CM | POA: Diagnosis present

## 2020-06-01 ENCOUNTER — Encounter: Payer: Self-pay | Admitting: Hematology and Oncology

## 2020-06-01 ENCOUNTER — Other Ambulatory Visit: Payer: Self-pay

## 2020-06-01 ENCOUNTER — Inpatient Hospital Stay (HOSPITAL_BASED_OUTPATIENT_CLINIC_OR_DEPARTMENT_OTHER): Payer: Medicare Other | Admitting: Hematology and Oncology

## 2020-06-01 ENCOUNTER — Inpatient Hospital Stay: Payer: Medicare Other | Attending: Hematology and Oncology

## 2020-06-01 VITALS — HR 56 | Temp 97.8°F | Resp 18 | Ht 68.0 in | Wt 203.0 lb

## 2020-06-01 DIAGNOSIS — E041 Nontoxic single thyroid nodule: Secondary | ICD-10-CM | POA: Diagnosis not present

## 2020-06-01 DIAGNOSIS — C8203 Follicular lymphoma grade I, intra-abdominal lymph nodes: Secondary | ICD-10-CM | POA: Insufficient documentation

## 2020-06-01 DIAGNOSIS — I82411 Acute embolism and thrombosis of right femoral vein: Secondary | ICD-10-CM

## 2020-06-01 DIAGNOSIS — Z86718 Personal history of other venous thrombosis and embolism: Secondary | ICD-10-CM | POA: Insufficient documentation

## 2020-06-01 LAB — CBC WITH DIFFERENTIAL (CANCER CENTER ONLY)
Abs Immature Granulocytes: 0.03 10*3/uL (ref 0.00–0.07)
Basophils Absolute: 0.1 10*3/uL (ref 0.0–0.1)
Basophils Relative: 1 %
Eosinophils Absolute: 0.4 10*3/uL (ref 0.0–0.5)
Eosinophils Relative: 7 %
HCT: 38.7 % — ABNORMAL LOW (ref 39.0–52.0)
Hemoglobin: 12.9 g/dL — ABNORMAL LOW (ref 13.0–17.0)
Immature Granulocytes: 1 %
Lymphocytes Relative: 25 %
Lymphs Abs: 1.6 10*3/uL (ref 0.7–4.0)
MCH: 30.9 pg (ref 26.0–34.0)
MCHC: 33.3 g/dL (ref 30.0–36.0)
MCV: 92.8 fL (ref 80.0–100.0)
Monocytes Absolute: 0.8 10*3/uL (ref 0.1–1.0)
Monocytes Relative: 13 %
Neutro Abs: 3.3 10*3/uL (ref 1.7–7.7)
Neutrophils Relative %: 53 %
Platelet Count: 194 10*3/uL (ref 150–400)
RBC: 4.17 MIL/uL — ABNORMAL LOW (ref 4.22–5.81)
RDW: 13 % (ref 11.5–15.5)
WBC Count: 6.2 10*3/uL (ref 4.0–10.5)
nRBC: 0 % (ref 0.0–0.2)

## 2020-06-01 LAB — CMP (CANCER CENTER ONLY)
ALT: 25 U/L (ref 0–44)
AST: 17 U/L (ref 15–41)
Albumin: 4 g/dL (ref 3.5–5.0)
Alkaline Phosphatase: 83 U/L (ref 38–126)
Anion gap: 8 (ref 5–15)
BUN: 17 mg/dL (ref 8–23)
CO2: 25 mmol/L (ref 22–32)
Calcium: 9.7 mg/dL (ref 8.9–10.3)
Chloride: 107 mmol/L (ref 98–111)
Creatinine: 0.95 mg/dL (ref 0.61–1.24)
GFR, Estimated: >60 mL/min (ref >60)
Glucose, Bld: 107 mg/dL — ABNORMAL HIGH (ref 70–99)
Potassium: 4.1 mmol/L (ref 3.5–5.1)
Sodium: 140 mmol/L (ref 135–145)
Total Bilirubin: 0.9 mg/dL (ref 0.3–1.2)
Total Protein: 6.8 g/dL (ref 6.5–8.1)

## 2020-06-01 LAB — CYTOLOGY - NON PAP

## 2020-06-01 LAB — LACTATE DEHYDROGENASE: LDH: 237 U/L — ABNORMAL HIGH (ref 98–192)

## 2020-06-01 NOTE — Progress Notes (Signed)
[place] [phone]   [phone]  PROGRESS NOTE  Patient Care Team: Inda Coke, [place] as PCP - General (Physician Assistant)  Hematological/Oncological History # Follicular Lymphoma Stage I Grade IA 1) 05/20/2019: CT scan showed stable mesenteric nodule, largest 4.2 cm and mild periaortic lymphadenopathy.  2) 06/04/2019: biopsy of abdominal lymph node showed grade 1 follicular lymphoma. Observation recommended by his oncologist at [facility] in [place], [place].  3) 03/14/2020: establish care with Dr. Lorenso Courier  4) 04/13/2020: CT scan showed stable mesenteric lymphadenopathy, measuring 3.9 x 1.9 cm.  CHIEF COMPLAINTS/PURPOSE OF CONSULTATION:  "Follicular Lymphoma"  HISTORY OF PRESENTING ILLNESS:  Taylor Berger 75 y.o. male with medical history significant for HTN, HLD, DVT ,GERD, and follicular lymphoma (diagnosed 06/2019 and under observation) presents for a follow up visit for follicular lymphoma.   Patient reports that in December 2021, he visited his family and ended up getting bronchitis after presenting with shortness of breath. He was treated with steroids and antibiotics. On 03/28/20, he was diagnosed with diverticulitis and then on 03/30/20 he was diagnosed with another DVT in his right lower extremity. He was initiated on Xarelto. He reports that the edema and pain in his right lower extremity improved within 1-2 weeks after initiating anticoagulation. He reports that his energy and appetite are overall stable. He continues to complete his ADLs independently. He denies any nausea, vomiting or abdominal pain. He has regular bowel movements without any hematochezia or melena. Patient reports blood in his "prostate fluid" after starting xarelto. He is currently under the care of a urologist who recommends monitoring for now.  He denies any hematuria or dysuria. He denies any fevers, chills, night sweats, lumps/bumps, skin changes,  chest pain, shortness of breath or cough. He has no other complaints. The rest of the 10 point ROS is negative.    MEDICAL HISTORY:  Past Medical History:  Diagnosis Date  . Allergy    Seasonal  . Aortic aneurysm ([place])   . Depression   . DVT (deep venous thrombosis) ([place]) 2020  . GERD (gastroesophageal reflux disease)   . Goiter   . Hyperlipidemia   . Hypertension   . Lymphoma ([place]) 9326   Follicular, stomach  . Neuropathy of both feet     SURGICAL HISTORY: Past Surgical History:  Procedure Laterality Date  . CARPAL TUNNEL RELEASE Left   . CERVICAL DISCECTOMY  1995   C-7  . CERVICAL FUSION  01/1989   C-3 through C-6 / no metal  . COLONOSCOPY    . ELBOW SURGERY Left   . FOOT SURGERY Left 1999   Plantar fascitis  . low back fusion  2013   Lumbar & Low Thoracic with metal  . SHOULDER ARTHROSCOPY WITH ROTATOR CUFF REPAIR Right 2014  . UPPER GASTROINTESTINAL ENDOSCOPY      SOCIAL HISTORY: Social History   Socioeconomic History  . Marital status: Married    Spouse name: Not on file  . Number of children: Not on file  . Years of education: Not on file  . Highest education level: Not on file  Occupational History  . Occupation: retired   Tobacco Use  . Smoking status: Never Smoker  . Smokeless tobacco: Never Used  Vaping Use  . Vaping Use: Never used  Substance and Sexual Activity  . Alcohol use: Not Currently  . Drug use: Never  . Sexual activity: Not Currently  Other Topics Concern  . Not on file  Social History Narrative   **  Merged History Encounter **       Moved from [facility] to be closer to son Married   Social Determinants of Radio broadcast assistant Strain: Low Risk   . Difficulty of Paying Living Expenses: Not hard at all  Food Insecurity: No Food Insecurity  . Worried About Charity fundraiser in the Last Year: Never true  . Ran Out of Food in the Last Year: Never true  Transportation Needs: No Transportation Needs  . Lack of Transportation  (Medical): No  . Lack of Transportation (Non-Medical): No  Physical Activity: Inactive  . Days of Exercise per Week: 0 days  . Minutes of Exercise per Session: 0 min  Stress: No Stress Concern Present  . Feeling of Stress : Not at all  Social Connections: Moderately Isolated  . Frequency of Communication with Friends and Family: Three times a week  . Frequency of Social Gatherings with Friends and Family: Once a week  . Attends Religious Services: Never  . Active Member of Clubs or Organizations: No  . Attends Archivist Meetings: Never  . Marital Status: Married  Human resources officer Violence: Not At Risk  . Fear of Current or Ex-Partner: No  . Emotionally Abused: No  . Physically Abused: No  . Sexually Abused: No    FAMILY HISTORY: Family History  Problem Relation Age of Onset  . Diabetes Mother   . Hypertension Mother   . Hyperlipidemia Mother   . Heart failure Mother   . Leukemia Father   . Stroke Father   . Esophageal cancer Neg Hx   . Colon polyps Neg Hx   . Pancreatic cancer Neg Hx   . Stomach cancer Neg Hx     ALLERGIES:  has No Known Allergies.  MEDICATIONS:  Current Outpatient Medications  Medication Sig Dispense Refill  . acyclovir (ZOVIRAX) 400 MG tablet Take 400 mg by mouth daily. 1/2 tablet    . amLODipine (NORVASC) 10 MG tablet Take 1 tablet (10 mg total) by mouth daily. 90 tablet 1  . atorvastatin (LIPITOR) 40 MG tablet Take 1 tablet (40 mg total) by mouth daily. 90 tablet 1  . cetirizine (ZYRTEC) 10 MG tablet Take 10 mg by mouth daily.    . clindamycin (CLEOCIN-T) 1 % external solution Apply topically 2 (two) times daily. 30 mL 0  . esomeprazole (NEXIUM) 20 MG capsule Take 1 capsule (20 mg total) by mouth daily at 12 noon. 90 capsule 3  . fluticasone (FLONASE) 50 MCG/ACT nasal spray Place into both nostrils as needed for allergies or rhinitis.    Marland Kitchen gabapentin (NEURONTIN) 300 MG capsule Take 1 capsule (300 mg total) by mouth 3 (three) times daily.  270 capsule 1  . lisinopril (ZESTRIL) 5 MG tablet Take 1 tablet (5 mg total) by mouth daily. 90 tablet 1  . rivaroxaban (XARELTO) 20 MG TABS tablet Take 1 tablet (20 mg total) by mouth daily with supper. 90 tablet 1   No current facility-administered medications for this visit.    REVIEW OF SYSTEMS:   Constitutional: ( - ) fevers, ( - )  chills , ( - ) night sweats Eyes: ( - ) blurriness of vision, ( - ) double vision, ( - ) watery eyes Ears, nose, mouth, throat, and face: ( - ) mucositis, ( - ) sore throat Respiratory: ( - ) cough, ( - ) dyspnea, ( - ) wheezes Cardiovascular: ( - ) palpitation, ( - ) chest discomfort, ( - ) lower  extremity swelling Gastrointestinal:  ( - ) nausea, ( - ) heartburn, ( - ) change in bowel habits Skin: ( - ) abnormal skin rashes Lymphatics: ( - ) new lymphadenopathy, ( - ) easy bruising Neurological: ( - ) tingling, ( - ) new weaknesses. + chronic neuropathy in lower extremities Behavioral/Psych: ( - ) mood change, ( - ) new changes  All other systems were reviewed with the patient and are negative.  PHYSICAL EXAMINATION: ECOG PERFORMANCE STATUS: 0 - Asymptomatic  Vitals:   06/01/20 0816  Pulse: (!) 56  Resp: 18  Temp: 97.8 F (36.6 C)  SpO2: 97%   Filed Weights   06/01/20 0816  Weight: 203 lb (92.1 kg)    GENERAL: well appearing elderly Caucasian male, appears younger than stated age, in NAD  SKIN: skin color, texture, turgor are normal, no rashes or significant lesions EYES: conjunctiva are pink and non-injected, sclera clear OROPHARYNX: no exudate, no erythema; lips, buccal mucosa, and tongue normal  NECK: supple, non-tender. Palpable goiter LYMPH:  no palpable lymphadenopathy in the cervical, axillary or supraclavicular lymph nodes.  LUNGS: clear to auscultation and percussion with normal breathing effort HEART: regular rate & rhythm and no murmurs. +1 edema in RLE without any erythema or tenderness.  Musculoskeletal: no cyanosis of  digits and no clubbing  PSYCH: alert & oriented x 3, fluent speech NEURO: no focal motor/sensory deficits  LABORATORY DATA:  I have reviewed the data as listed CBC Latest Ref Rng & Units 06/01/2020 03/14/2020 03/10/2020  WBC 4.0 - 10.5 K/uL 6.2 12.5(H) 14.6(H)  Hemoglobin 13.0 - 17.0 g/dL 12.9(L) 14.6 13.6  Hematocrit 39.0 - 52.0 % 38.7(L) 43.4 40.8  Platelets 150 - 400 K/uL 194 332 294    CMP Latest Ref Rng & Units 06/01/2020 03/14/2020 03/10/2020  Glucose 70 - 99 mg/dL 107(H) 131(H) 112(H)  BUN 8 - 23 mg/dL 17 17 28(H)  Creatinine 0.61 - 1.24 mg/dL 0.95 1.02 0.94  Sodium 135 - 145 mmol/L 140 141 140  Potassium 3.5 - 5.1 mmol/L 4.1 3.7 3.3(L)  Chloride 98 - 111 mmol/L 107 102 105  CO2 22 - 32 mmol/L 25 32 26  Calcium 8.9 - 10.3 mg/dL 9.7 9.6 10.2  Total Protein 6.5 - 8.1 g/dL 6.8 6.7 7.1  Total Bilirubin 0.3 - 1.2 mg/dL 0.9 1.0 0.5  Alkaline Phos 38 - 126 U/L 83 70 70  AST 15 - 41 U/L 17 28 53(H)  ALT 0 - 44 U/L 25 43 51(H)     PATHOLOGY:     RADIOGRAPHIC STUDIES: I have personally reviewed the radiological images as listed and agreed with the findings in the report. US Thyroid Biopsy  Result Date: 05/31/2020 INDICATION: Indeterminate right mid thyroid nodule EXAM: ULTRASOUND GUIDED FINE NEEDLE ASPIRATION OF INDETERMINATE THYROID NODULE COMPARISON:  Ultrasound thyroid 04/25/2020 MEDICATIONS: 1% lidocaine 3 mL COMPLICATIONS: None immediate. TECHNIQUE: Informed written consent was obtained from the patient after a discussion of the risks, benefits and alternatives to treatment. Questions regarding the procedure were encouraged and answered. A timeout was performed prior to the initiation of the procedure. Pre-procedural ultrasound scanning demonstrated unchanged size and appearance of the indeterminate nodule within the right mid thyroid. The procedure was planned. The neck was prepped in the usual sterile fashion, and a sterile drape was applied covering the operative field. A timeout  was performed prior to the initiation of the procedure. Local anesthesia was provided with 1% lidocaine. Under direct ultrasound guidance, 5 FNA biopsies were performed of  the right mid thyroid nodule with a 25 gauge needle. Multiple ultrasound images were saved for procedural documentation purposes. The samples were prepared and submitted to pathology. Two of these samples were prepared for Afirma testing. Limited post procedural scanning was negative for hematoma or additional complication. Dressings were placed. The patient tolerated the above procedures procedure well without immediate postprocedural complication. FINDINGS: Nodule reference number based on prior diagnostic ultrasound: 2 Maximum size: 1.9 cm Location: Right; Mid ACR TI-RADS risk category: TR5 (>/= 7 points) Reason for biopsy: meets ACR TI-RADS criteria Ultrasound imaging confirms appropriate placement of the needles within the thyroid nodule. IMPRESSION: Technically successful ultrasound guided fine needle aspiration of right mid thyroid nodule. Read by: Soyla Dryer, NP Electronically Signed   By: Lucrezia Europe M.D.   On: 05/31/2020 16:28    ASSESSMENT & PLAN Taylor Berger 75 y.o. male with medical history significant for HTN, HLD, DVT ,GERD, and follicular lymphoma (diagnosed 06/2019 and under observation) presents to establish care with oncology for his follicular lymphoma.  After review the labs, review the records, discussion with the patient the findings are most consistent with a low-grade stage I follicular lymphoma of the lymph node of the abdomen.  At this time there is no indication for treatment as there is no bulky disease, no B symptoms, and no cytopenias.  We can continue to follow this patient in clinic with lab work and clinical follow-up.   #Grade IA Follicular Lymphoma --diagnosed in [place] in March 2021. Biopsy confirms Grade IA follicular lymphoma with a Ki-67 of 5-10%. Currently on observation.  --Reviewed labs from  today without any intervention needed. CT abdomen/pelvis from 04/13/20 revealed stable mesenteric lymphadenopathy. Recommendation is to continue with observation and return in 6 months with lab work and CT abdomen/pelvis. If next CT abdomen/pelvis shows stable findings, we will plan to continue with observation with lab work alone.   #DVT x 2 episodes.  --Initially diagnosed with DVT of the right femoral through popliteal veins on 12/10/2018. Treated with 6 months of anticoagulation therapy. At initial visit, discussed the risks of recurrent VTE and the recommendation for indefinite anticoagulation as it was likely an unprovoked event. Patient elected to continue with observation for recurrent clot. --On 03/30/20, patient had another DVT of the right lower extremity. Doppler US was obtained that revealed acute DVT in the right gastrocnemius veins, right intramuscular calf vein. In addition, there were two pairs of acutely thrombosed veins appear to arise from the popliteal vein and are thrombosed to the mid calf. No acute DVT in the popliteal vein. There was evidence of chronic deep vein thrombosis involving the right femoral vein, and popliteal vein. Likely provoked by recent episode of diverticulitis. Patient was started on Xarelto 55m PO daily.  --Discussed the recommendation for indefinite anticoagulation due to recurrent episodes of DVTs, with initial episode that was likely unprovoked. Patient was in agreement with this recommendation.  --after 6 months of anticoagulation can consider lowering the dose to maintenance Xaretlo 10 mg PO daily.   #Health maintenance --Patient is scheduled for screening EGD (due to history of Barrett's esophagus) and colonoscopy (due to history of colon polyps) with GI in June 2022.  ' --Patient is up to date on COVID and influenza vaccinations. He plans to get his pneumococcal vaccine tomorrow.   #Right mid thyroid nodule --Underwent fine needle aspiration yesterday on  05/31/20, pending results.   Orders Placed This Encounter  Procedures  . CT Abdomen Pelvis W Contrast  Berger Status:   Future    Berger Expiration Date:   06/01/2021    Order Specific Question:   If indicated for the ordered procedure, I authorize the administration of contrast media per Radiology protocol    Answer:   Yes    Order Specific Question:   Preferred imaging location?    Answer:   [facility]    Order Specific Question:   Is Oral Contrast requested for this exam?    Answer:   Yes, Per Radiology protocol  . CBC with Differential (Cancer Center Only)    Berger Status:   Future    Berger Expiration Date:   06/01/2021  . CMP ([place] [facility] only)    Berger Status:   Future    Berger Expiration Date:   06/01/2021  . Lactate dehydrogenase (LDH)    Berger Status:   Future    Berger Expiration Date:   06/01/2021    All questions were answered. The patient knows to call the clinic with any problems, questions or concerns.   Ledell Peoples, MD Department of Hematology/Oncology [place] [facility] Phone: [phone] Pager: [phone] Email: [email or web address]   06/01/2020 1:17 PM

## 2020-06-02 ENCOUNTER — Ambulatory Visit: Payer: Medicare Other

## 2020-06-02 ENCOUNTER — Telehealth: Payer: Self-pay | Admitting: Hematology and Oncology

## 2020-06-02 ENCOUNTER — Encounter: Payer: Medicare Other | Admitting: Physical Therapy

## 2020-06-02 NOTE — Telephone Encounter (Signed)
Called and spoke with patient.. will call back to scheduled 56mth f/u as requested in LOS

## 2020-06-29 ENCOUNTER — Encounter: Payer: Self-pay | Admitting: Physician Assistant

## 2020-06-29 ENCOUNTER — Telehealth (INDEPENDENT_AMBULATORY_CARE_PROVIDER_SITE_OTHER): Payer: Medicare Other | Admitting: Physician Assistant

## 2020-06-29 VITALS — Ht 68.0 in | Wt 195.0 lb

## 2020-06-29 DIAGNOSIS — R059 Cough, unspecified: Secondary | ICD-10-CM

## 2020-06-29 MED ORDER — HYDROCOD POLST-CPM POLST ER 10-8 MG/5ML PO SUER: 5.0000 mL | Freq: Every evening | ORAL | 0 refills | Status: DC | PRN

## 2020-06-29 MED ORDER — DOXYCYCLINE HYCLATE 100 MG PO TABS: 100.0000 mg | ORAL_TABLET | Freq: Two times a day (BID) | ORAL | 0 refills | Status: DC

## 2020-06-29 MED ORDER — GUAIFENESIN-CODEINE 100-10 MG/5ML PO SYRP: 5.0000 mL | ORAL_SOLUTION | Freq: Three times a day (TID) | ORAL | 0 refills | Status: DC | PRN

## 2020-06-29 NOTE — Progress Notes (Signed)
Virtual Visit via Video   I connected with Taylor Berger on 06/29/20 at 11:30 AM EDT by a video enabled telemedicine application and verified that I am speaking with the correct person using two identifiers. Location patient: Home Location provider: [facility], Office Persons participating in the virtual visit: Taylor Berger, Taylor Coke PA-C, Taylor Pickler, LPN   I discussed the limitations of evaluation and management by telemedicine and the availability of in person appointments. The patient expressed understanding and agreed to proceed.  I acted as a Education administrator for [employer], PA-C [employer], LPN   Subjective:   HPI:   Patient is requesting evaluation for possible URI. Started as "allergy cold" with all the pollen in the air.  Symptom onset: 10 days ago  Travel/contacts: No exposure or contacts  Vaccination status: 3 shots  Testing results: None  Patient endorses the following symptoms: Cough and chest congestion, coughing and expectorating green sputum.  Having some chills, headache, sinus pressure, fatigue. Coughing is worst at night. Mild sore throat has improved.  Patient denies the following symptoms: Body aches, SOB, fevers  Treatments tried: Nyquil, Expectorant   Patient risk factors: Current [id number] risk of complications score: 4 Smoking status: Taylor Berger  reports that he has never smoked. He has never used smokeless tobacco. If male, currently pregnant? []   Yes []   No  ROS: See pertinent positives and negatives per HPI.  Patient Active Problem List   Diagnosis Date Noted  . Follicular lymphoma grade I of intra-abdominal lymph nodes ([place]) 03/07/2020  . Hypertension 01/29/2020  . Thoracic aortic aneurysm ([place]) 01/29/2020  . DVT R femoral thigh 01/29/2020  . GERD (gastroesophageal reflux disease) 01/29/2020  . History of Barrett's esophagus 01/29/2020  . Goiter 01/29/2020  . Neuropathy of both feet   . Lymphoma ([place]) 2020    Social  History   Tobacco Use  . Smoking status: Never Smoker  . Smokeless tobacco: Never Used  Substance Use Topics  . Alcohol use: Not Currently    Current Outpatient Medications:  .  acyclovir (ZOVIRAX) 400 MG tablet, Take 400 mg by mouth daily. 1/2 tablet, Disp: , Rfl:  .  amLODipine (NORVASC) 10 MG tablet, Take 1 tablet (10 mg total) by mouth daily., Disp: 90 tablet, Rfl: 1 .  atorvastatin (LIPITOR) 40 MG tablet, Take 1 tablet (40 mg total) by mouth daily., Disp: 90 tablet, Rfl: 1 .  cetirizine (ZYRTEC) 10 MG tablet, Take 10 mg by mouth daily., Disp: , Rfl:  .  clindamycin (CLEOCIN-T) 1 % external solution, Apply topically 2 (two) times daily. (Patient taking differently: Apply topically as needed.), Disp: 30 mL, Rfl: 0 .  doxycycline (VIBRA-TABS) 100 MG tablet, Take 1 tablet (100 mg total) by mouth 2 (two) times daily., Disp: 20 tablet, Rfl: 0 .  esomeprazole (NEXIUM) 20 MG capsule, Take 1 capsule (20 mg total) by mouth daily at 12 noon., Disp: 90 capsule, Rfl: 3 .  fluticasone (FLONASE) 50 MCG/ACT nasal spray, Place into both nostrils as needed for allergies or rhinitis., Disp: , Rfl:  .  gabapentin (NEURONTIN) 300 MG capsule, Take 1 capsule (300 mg total) by mouth 3 (three) times daily., Disp: 270 capsule, Rfl: 1 .  guaiFENesin-codeine (ROBITUSSIN AC) 100-10 MG/5ML syrup, Take 5 mLs by mouth 3 (three) times daily as needed for cough., Disp: 120 mL, Rfl: 0 .  lisinopril (ZESTRIL) 5 MG tablet, Take 1 tablet (5 mg total) by mouth daily., Disp: 90 tablet, Rfl: 1 .  rivaroxaban (XARELTO)  20 MG TABS tablet, Take 1 tablet (20 mg total) by mouth daily with supper., Disp: 90 tablet, Rfl: 1  No Known Allergies  Objective:   VITALS: Per patient if applicable, see vitals. GENERAL: Alert, appears well and in no acute distress. HEENT: Atraumatic, conjunctiva clear, no obvious abnormalities on inspection of external nose and ears. NECK: Normal movements of the head and neck. CARDIOPULMONARY: No  increased WOB. Speaking in clear sentences. I:E ratio WNL.  MS: Moves all visible extremities without noticeable abnormality. PSYCH: Pleasant and cooperative, well-groomed. Speech normal rate and rhythm. Affect is appropriate. Insight and judgement are appropriate. Attention is focused, linear, and appropriate.  NEURO: CN grossly intact. Oriented as arrived to appointment on time with no prompting. Moves both UE equally.  SKIN: No obvious lesions, wounds, erythema, or cyanosis noted on face or hands.  Assessment and Plan:   Taylor Berger was seen today for covid symptoms.  Diagnoses and all orders for this visit:  Cough  Other orders -     Discontinue: chlorpheniramine-HYDROcodone (TUSSIONEX PENNKINETIC ER) 10-8 MG/5ML SUER; Take 5 mLs by mouth at bedtime as needed for cough. -     doxycycline (VIBRA-TABS) 100 MG tablet; Take 1 tablet (100 mg total) by mouth 2 (two) times daily. -     guaiFENesin-codeine (ROBITUSSIN AC) 100-10 MG/5ML syrup; Take 5 mLs by mouth 3 (three) times daily as needed for cough.   No red flags on discussion, patient is not in any obvious distress during our visit. Will treat for presumed sinusitis with oral doxycycline. I have also prescribed cheratussin. Sleepy precautions advised. Declined COVID testing. Discussed over the counter supportive care options, including Tylenol 500 mg q 8 hours, with recommendations to push fluids and rest. Reviewed return precautions including new/worsening fever, SOB, new/worsening cough, sudden onset changes of symptoms. Recommended need to self-quarantine and practice social distancing until symptoms resolve. I recommend that patient follow-up if symptoms worsen or persist despite treatment x 7-10 days, sooner if needed.  I discussed the assessment and treatment plan with the patient. The patient was provided an opportunity to ask questions and all were answered. The patient agreed with the plan and demonstrated an understanding of the  instructions.   The patient was advised to call back or seek an in-person evaluation if the symptoms worsen or if the condition fails to improve as anticipated.   CMA or LPN served as scribe during this visit. History, Physical, and Plan performed by medical provider. The above documentation has been reviewed and is accurate and complete.  [place], [place] 06/29/2020

## 2020-08-01 ENCOUNTER — Telehealth (INDEPENDENT_AMBULATORY_CARE_PROVIDER_SITE_OTHER): Payer: Self-pay | Admitting: Otolaryngology

## 2020-08-01 ENCOUNTER — Telehealth: Payer: Self-pay | Admitting: Hematology and Oncology

## 2020-08-01 NOTE — Telephone Encounter (Signed)
Scheduled appt per 5/2 sch msg. Pt aware.  

## 2020-08-01 NOTE — Telephone Encounter (Signed)
Called Taylor Berger today concerning results of this fine-needle aspirate of the right thyroid nodule.  This demonstrated findings consistent with a benign follicular nodule Bet[place]da category II. I reviewed the path report with the patient in the office today that this is a benign nodule it would not warrant removal unless it significantly enlarges and causes symptoms.

## 2020-08-18 ENCOUNTER — Other Ambulatory Visit: Payer: Self-pay

## 2020-08-18 MED ORDER — VALACYCLOVIR HCL 1 G PO TABS: 1000.0000 mg | ORAL_TABLET | Freq: Every day | ORAL | 1 refills | Status: DC

## 2020-08-18 NOTE — Telephone Encounter (Signed)
  LAST APPOINTMENT DATE: 05/18/2020   NEXT APPOINTMENT DATE:@Visit  date not found  MEDICATION:Zalacyclovir 1000 MG  PHARMACY: [facility], [place]  Comments: Patient states him and Aldona Bar have discussed her taking over the medication from his previous doctor, Dr.Burrows  Please advise

## 2020-08-18 NOTE — Telephone Encounter (Signed)
Left message with wife to call office on home number. Called pt's mobile and spoke to him, told him need to clarify Rx I have acyclovir 400 mg on your chart. Pt said no he is taking Valacyclovir 1000 mg daily. Told pt okay I will check with Dr. Jerline Pain if okay to send in, if a problem I will call you back. Also disregard call at home. Pt verbalized understanding.

## 2020-08-18 NOTE — Telephone Encounter (Signed)
Dr. Jerline Pain, pt is requesting refill for Valtrex 1000 mg daily. He said he discussed with Sam. Okay to fill?

## 2020-08-30 ENCOUNTER — Other Ambulatory Visit: Payer: Self-pay

## 2020-08-30 ENCOUNTER — Ambulatory Visit (INDEPENDENT_AMBULATORY_CARE_PROVIDER_SITE_OTHER): Payer: Medicare Other | Admitting: Podiatry

## 2020-08-30 ENCOUNTER — Encounter: Payer: Self-pay | Admitting: Podiatry

## 2020-08-30 ENCOUNTER — Ambulatory Visit (INDEPENDENT_AMBULATORY_CARE_PROVIDER_SITE_OTHER): Payer: Medicare Other

## 2020-08-30 DIAGNOSIS — M722 Plantar fascial fibromatosis: Secondary | ICD-10-CM

## 2020-08-30 DIAGNOSIS — M7661 Achilles tendinitis, right leg: Secondary | ICD-10-CM

## 2020-08-30 MED ORDER — METHYLPREDNISOLONE 4 MG PO TBPK: ORAL_TABLET | ORAL | 0 refills | Status: DC

## 2020-08-30 NOTE — Progress Notes (Signed)
  Subjective:  Patient ID: Taylor Berger, male    DOB: [birth year],  MRN: [id number]  Chief Complaint  Patient presents with  . Foot Pain    Right heel pain    75 y.o. male presents with the above complaint. History confirmed with patient.  Its been going on for about a month.  Its worse after he has been standing or walking all day.  He is on Xarelto for DVTs.  Objective:  Physical Exam: warm, good capillary refill, no trophic changes or ulcerative lesions, normal DP and PT pulses and normal sensory exam. Right Foot: Sharp pain on palpation to plantar and posterior heel and along Achilles tendon  Radiographs: X-ray of the right foot: no fracture, dislocation, swelling or degenerative changes noted, plantar calcaneal spur and posterior calcaneal spur Assessment:   1. Plantar fasciitis of right foot   2. Achilles tendinitis, right leg      Plan:  Patient was evaluated and treated and all questions answered.  Discussed the etiology and treatment options for Achilles tendinitis including stretching, formal physical therapy with an eccentric exercises therapy plan, supportive shoegears such as a running shoe or sneaker, heel lifts, topical and oral medications.  We also discussed that I do not routinely perform injections in this area because of the risk of an increased damage or rupture of the tendon.  We also discussed the role of surgical treatment of this for patients who do not improve after exhausting non-surgical treatment options.  -XR reviewed with patient -Educated on stretching and icing of the affected limb. -Rx for Medrol 6-day taper. Advised on risks, benefits, and alternatives of the medication    Discussed the etiology and treatment options for plantar fasciitis including stretching, formal physical therapy, supportive shoegears such as a running shoe or sneaker, pre fabricated orthoses, injection therapy, and oral medications. We also discussed the role of surgical  treatment of this for patients who do not improve after exhausting non-surgical treatment options.   -XR reviewed with patient -Educated patient on stretching and icing of the affected limb -Injection delivered to the plantar fascia of the right foot.  CAM boot dispensed he will be WBAT in this.  Recommended a even up shoe due to low back and balance issues   Return in about 1 month (around 09/29/2020) for re-check Achilles tendon, recheck plantar fasciitis.

## 2020-08-30 NOTE — Patient Instructions (Signed)
Look for Voltaren gel at the pharmacy over the counter or online (also known as diclofenac 1% gel). Apply to the painful areas 3-4x daily with the supplied dosing card. Allow to dry for 10 minutes before going into socks/shoes   Look for an "EvenUp" shoe attachment on [employer] or at [employer]. This will level out your hips while you are walking in the CAM boot. Wear this on the other foot around a supportive sneaker:     Achilles Tendinitis  with Rehab Achilles tendinitis is a disorder of the Achilles tendon. The Achilles tendon connects the large calf muscles (Gastrocnemius and Soleus) to the heel bone (calcaneus). This tendon is sometimes called the heel cord. It is important for pushing-off and standing on your toes and is important for walking, running, or jumping. Tendinitis is often caused by overuse and repetitive microtrauma. SYMPTOMS  Pain, tenderness, swelling, warmth, and redness may occur over the Achilles tendon even at rest.  Pain with pushing off, or flexing or extending the ankle.  Pain that is worsened after or during activity. CAUSES   Overuse sometimes seen with rapid increase in exercise programs or in sports requiring running and jumping.  Poor physical conditioning (strength and flexibility or endurance).  Running sports, especially training running down hills.  Inadequate warm-up before practice or play or failure to stretch before participation.  Injury to the tendon. PREVENTION   Warm up and stretch before practice or competition.  Allow time for adequate rest and recovery between practices and competition.  Keep up conditioning.  Keep up ankle and leg flexibility.  Improve or keep muscle strength and endurance.  Improve cardiovascular fitness.  Use proper technique.  Use proper equipment (shoes, skates).  To help prevent recurrence, taping, protective strapping, or an adhesive bandage may be recommended for several weeks after healing is  complete. PROGNOSIS   Recovery may take weeks to several months to heal.  Longer recovery is expected if symptoms have been prolonged.  Recovery is usually quicker if the inflammation is due to a direct blow as compared with overuse or sudden strain. RELATED COMPLICATIONS   Healing time will be prolonged if the condition is not correctly treated. The injury must be given plenty of time to heal.  Symptoms can reoccur if activity is resumed too soon.  Untreated, tendinitis may increase the risk of tendon rupture requiring additional time for recovery and possibly surgery. TREATMENT   The first treatment consists of rest anti-inflammatory medication, and ice to relieve the pain.  Stretching and strengthening exercises after resolution of pain will likely help reduce the risk of recurrence. Referral to a physical therapist or athletic trainer for further evaluation and treatment may be helpful.  A walking boot or cast may be recommended to rest the Achilles tendon. This can help break the cycle of inflammation and microtrauma.  Arch supports (orthotics) may be prescribed or recommended by your caregiver as an adjunct to therapy and rest.  Surgery to remove the inflamed tendon lining or degenerated tendon tissue is rarely necessary and has shown less than predictable results. MEDICATION   Nonsteroidal anti-inflammatory medications, such as aspirin and ibuprofen, may be used for pain and inflammation relief. Do not take within 7 days before surgery. Take these as directed by your caregiver. Contact your caregiver immediately if any bleeding, stomach upset, or signs of allergic reaction occur. Other minor pain relievers, such as acetaminophen, may also be used.  Pain relievers may be prescribed as necessary by your caregiver. Do  not take prescription pain medication for longer than 4 to 7 days. Use only as directed and only as much as you need.  Cortisone injections are rarely indicated.  Cortisone injections may weaken tendons and predispose to rupture. It is better to give the condition more time to heal than to use them. HEAT AND COLD  Cold is used to relieve pain and reduce inflammation for acute and chronic Achilles tendinitis. Cold should be applied for 10 to 15 minutes every 2 to 3 hours for inflammation and pain and immediately after any activity that aggravates your symptoms. Use ice packs or an ice massage.  Heat may be used before performing stretching and strengthening activities prescribed by your caregiver. Use a heat pack or a warm soak. SEEK MEDICAL CARE IF:  Symptoms get worse or do not improve in 2 weeks despite treatment.  New, unexplained symptoms develop. Drugs used in treatment may produce side effects.  EXERCISES:  RANGE OF MOTION (ROM) AND STRETCHING EXERCISES - Achilles Tendinitis  These exercises may help you when beginning to rehabilitate your injury. Your symptoms may resolve with or without further involvement from your physician, physical therapist or athletic trainer. While completing these exercises, remember:   Restoring tissue flexibility helps normal motion to return to the joints. This allows healthier, less painful movement and activity.  An effective stretch should be held for at least 30 seconds.  A stretch should never be painful. You should only feel a gentle lengthening or release in the stretched tissue.  STRETCH  Gastroc, Standing   Place hands on wall.  Extend right / left leg, keeping the front knee somewhat bent.  Slightly point your toes inward on your back foot.  Keeping your right / left heel on the floor and your knee straight, shift your weight toward the wall, not allowing your back to arch.  You should feel a gentle stretch in the right / left calf. Hold this position for 10 seconds. Repeat 3 times. Complete this stretch 2 times per day.  STRETCH  Soleus, Standing   Place hands on wall.  Extend right / left  leg, keeping the other knee somewhat bent.  Slightly point your toes inward on your back foot.  Keep your right / left heel on the floor, bend your back knee, and slightly shift your weight over the back leg so that you feel a gentle stretch deep in your back calf.  Hold this position for 10 seconds. Repeat 3 times. Complete this stretch 2 times per day.  STRETCH  Gastrocsoleus, Standing  Note: This exercise can place a lot of stress on your foot and ankle. Please complete this exercise only if specifically instructed by your caregiver.   Place the ball of your right / left foot on a step, keeping your other foot firmly on the same step.  Hold on to the wall or a rail for balance.  Slowly lift your other foot, allowing your body weight to press your heel down over the edge of the step.  You should feel a stretch in your right / left calf.  Hold this position for 10 seconds.  Repeat this exercise with a slight bend in your knee. Repeat 3 times. Complete this stretch 2 times per day.   STRENGTHENING EXERCISES - Achilles Tendinitis These exercises may help you when beginning to rehabilitate your injury. They may resolve your symptoms with or without further involvement from your physician, physical therapist or athletic trainer. While completing these exercises,  remember:   Muscles can gain both the endurance and the strength needed for everyday activities through controlled exercises.  Complete these exercises as instructed by your physician, physical therapist or athletic trainer. Progress the resistance and repetitions only as guided.  You may experience muscle soreness or fatigue, but the pain or discomfort you are trying to eliminate should never worsen during these exercises. If this pain does worsen, stop and make certain you are following the directions exactly. If the pain is still present after adjustments, discontinue the exercise until you can discuss the trouble with your  clinician.  STRENGTH - Plantar-flexors   Sit with your right / left leg extended. Holding onto both ends of a rubber exercise band/tubing, loop it around the ball of your foot. Keep a slight tension in the band.  Slowly push your toes away from you, pointing them downward.  Hold this position for 10 seconds. Return slowly, controlling the tension in the band/tubing. Repeat 3 times. Complete this exercise 2 times per day.   STRENGTH - Plantar-flexors   Stand with your feet shoulder width apart. Steady yourself with a wall or table using as little support as needed.  Keeping your weight evenly spread over the width of your feet, rise up on your toes.*  Hold this position for 10 seconds. Repeat 3 times. Complete this exercise 2 times per day.  *If this is too easy, shift your weight toward your right / left leg until you feel challenged. Ultimately, you may be asked to do this exercise with your right / left foot only.  STRENGTH  Plantar-flexors, Eccentric  Note: This exercise can place a lot of stress on your foot and ankle. Please complete this exercise only if specifically instructed by your caregiver.   Place the balls of your feet on a step. With your hands, use only enough support from a wall or rail to keep your balance.  Keep your knees straight and rise up on your toes.  Slowly shift your weight entirely to your right / left toes and pick up your opposite foot. Gently and with controlled movement, lower your weight through your right / left foot so that your heel drops below the level of the step. You will feel a slight stretch in the back of your calf at the end position.  Use the healthy leg to help rise up onto the balls of both feet, then lower weight only on the right / left leg again. Build up to 15 repetitions. Then progress to 3 consecutive sets of 15 repetitions.*  After completing the above exercise, complete the same exercise with a slight knee bend (about 30  degrees). Again, build up to 15 repetitions. Then progress to 3 consecutive sets of 15 repetitions.* Perform this exercise 2 times per day.  *When you easily complete 3 sets of 15, your physician, physical therapist or athletic trainer may advise you to add resistance by wearing a backpack filled with additional weight.  STRENGTH - Plantar Flexors, Seated   Sit on a chair that allows your feet to rest flat on the ground. If necessary, sit at the edge of the chair.  Keeping your toes firmly on the ground, lift your right / left heel as far as you can without increasing any discomfort in your ankle. Repeat 3 times. Complete this exercise 2 times a day.     Plantar Fasciitis (Heel Spur Syndrome) with Rehab The plantar fascia is a fibrous, ligament-like, soft-tissue structure that spans the  bottom of the foot. Plantar fasciitis is a condition that causes pain in the foot due to inflammation of the tissue. SYMPTOMS   Pain and tenderness on the underneath side of the foot.  Pain that worsens with standing or walking. CAUSES  Plantar fasciitis is caused by irritation and injury to the plantar fascia on the underneath side of the foot. Common mechanisms of injury include:  Direct trauma to bottom of the foot.  Damage to a small nerve that runs under the foot where the main fascia attaches to the heel bone.  Stress placed on the plantar fascia due to bone spurs. RISK INCREASES WITH:   Activities that place stress on the plantar fascia (running, jumping, pivoting, or cutting).  Poor strength and flexibility.  Improperly fitted shoes.  Tight calf muscles.  Flat feet.  Failure to warm-up properly before activity.  Obesity. PREVENTION  Warm up and stretch properly before activity.  Allow for adequate recovery between workouts.  Maintain physical fitness:  Strength, flexibility, and endurance.  Cardiovascular fitness.  Maintain a health body weight.  Avoid stress on the  plantar fascia.  Wear properly fitted shoes, including arch supports for individuals who have flat feet.  PROGNOSIS  If treated properly, then the symptoms of plantar fasciitis usually resolve without surgery. However, occasionally surgery is necessary.  RELATED COMPLICATIONS   Recurrent symptoms that may result in a chronic condition.  Problems of the lower back that are caused by compensating for the injury, such as limping.  Pain or weakness of the foot during push-off following surgery.  Chronic inflammation, scarring, and partial or complete fascia tear, occurring more often from repeated injections.  TREATMENT  Treatment initially involves the use of ice and medication to help reduce pain and inflammation. The use of strengthening and stretching exercises may help reduce pain with activity, especially stretches of the Achilles tendon. These exercises may be performed at home or with a therapist. Your caregiver may recommend that you use heel cups of arch supports to help reduce stress on the plantar fascia. Occasionally, corticosteroid injections are given to reduce inflammation. If symptoms persist for greater than 6 months despite non-surgical (conservative), then surgery may be recommended.   MEDICATION   If pain medication is necessary, then nonsteroidal anti-inflammatory medications, such as aspirin and ibuprofen, or other minor pain relievers, such as acetaminophen, are often recommended.  Do not take pain medication within 7 days before surgery.  Prescription pain relievers may be given if deemed necessary by your caregiver. Use only as directed and only as much as you need.  Corticosteroid injections may be given by your caregiver. These injections should be reserved for the most serious cases, because they may only be given a certain number of times.  HEAT AND COLD  Cold treatment (icing) relieves pain and reduces inflammation. Cold treatment should be applied for 10 to  15 minutes every 2 to 3 hours for inflammation and pain and immediately after any activity that aggravates your symptoms. Use ice packs or massage the area with a piece of ice (ice massage).  Heat treatment may be used prior to performing the stretching and strengthening activities prescribed by your caregiver, physical therapist, or athletic trainer. Use a heat pack or soak the injury in warm water.  SEEK IMMEDIATE MEDICAL CARE IF:  Treatment seems to offer no benefit, or the condition worsens.  Any medications produce adverse side effects.  EXERCISES- RANGE OF MOTION (ROM) AND STRETCHING EXERCISES - Plantar Fasciitis (Heel Spur  Syndrome) These exercises may help you when beginning to rehabilitate your injury. Your symptoms may resolve with or without further involvement from your physician, physical therapist or athletic trainer. While completing these exercises, remember:   Restoring tissue flexibility helps normal motion to return to the joints. This allows healthier, less painful movement and activity.  An effective stretch should be held for at least 30 seconds.  A stretch should never be painful. You should only feel a gentle lengthening or release in the stretched tissue.  RANGE OF MOTION - Toe Extension, Flexion  Sit with your right / left leg crossed over your opposite knee.  Grasp your toes and gently pull them back toward the top of your foot. You should feel a stretch on the bottom of your toes and/or foot.  Hold this stretch for 10 seconds.  Now, gently pull your toes toward the bottom of your foot. You should feel a stretch on the top of your toes and or foot.  Hold this stretch for 10 seconds. Repeat  times. Complete this stretch 3 times per day.   RANGE OF MOTION - Ankle Dorsiflexion, Active Assisted  Remove shoes and sit on a chair that is preferably not on a carpeted surface.  Place right / left foot under knee. Extend your opposite leg for support.  Keeping  your heel down, slide your right / left foot back toward the chair until you feel a stretch at your ankle or calf. If you do not feel a stretch, slide your bottom forward to the edge of the chair, while still keeping your heel down.  Hold this stretch for 10 seconds. Repeat 3 times. Complete this stretch 2 times per day.   STRETCH  Gastroc, Standing  Place hands on wall.  Extend right / left leg, keeping the front knee somewhat bent.  Slightly point your toes inward on your back foot.  Keeping your right / left heel on the floor and your knee straight, shift your weight toward the wall, not allowing your back to arch.  You should feel a gentle stretch in the right / left calf. Hold this position for 10 seconds. Repeat 3 times. Complete this stretch 2 times per day.  STRETCH  Soleus, Standing  Place hands on wall.  Extend right / left leg, keeping the other knee somewhat bent.  Slightly point your toes inward on your back foot.  Keep your right / left heel on the floor, bend your back knee, and slightly shift your weight over the back leg so that you feel a gentle stretch deep in your back calf.  Hold this position for 10 seconds. Repeat 3 times. Complete this stretch 2 times per day.  STRETCH  Gastrocsoleus, Standing  Note: This exercise can place a lot of stress on your foot and ankle. Please complete this exercise only if specifically instructed by your caregiver.   Place the ball of your right / left foot on a step, keeping your other foot firmly on the same step.  Hold on to the wall or a rail for balance.  Slowly lift your other foot, allowing your body weight to press your heel down over the edge of the step.  You should feel a stretch in your right / left calf.  Hold this position for 10 seconds.  Repeat this exercise with a slight bend in your right / left knee. Repeat 3 times. Complete this stretch 2 times per day.   STRENGTHENING EXERCISES - Plantar Fasciitis  (  Heel Spur Syndrome)  These exercises may help you when beginning to rehabilitate your injury. They may resolve your symptoms with or without further involvement from your physician, physical therapist or athletic trainer. While completing these exercises, remember:   Muscles can gain both the endurance and the strength needed for everyday activities through controlled exercises.  Complete these exercises as instructed by your physician, physical therapist or athletic trainer. Progress the resistance and repetitions only as guided.  STRENGTH - Towel Curls  Sit in a chair positioned on a non-carpeted surface.  Place your foot on a towel, keeping your heel on the floor.  Pull the towel toward your heel by only curling your toes. Keep your heel on the floor. Repeat 3 times. Complete this exercise 2 times per day.  STRENGTH - Ankle Inversion  Secure one end of a rubber exercise band/tubing to a fixed object (table, pole). Loop the other end around your foot just before your toes.  Place your fists between your knees. This will focus your strengthening at your ankle.  Slowly, pull your big toe up and in, making sure the band/tubing is positioned to resist the entire motion.  Hold this position for 10 seconds.  Have your muscles resist the band/tubing as it slowly pulls your foot back to the starting position. Repeat 3 times. Complete this exercises 2 times per day.  Document Released: 03/19/2005 Document Revised: 06/11/2011 Document Reviewed: 07/01/2008 [facility] Patient Information 2014 [place], [place].

## 2020-08-31 ENCOUNTER — Telehealth: Payer: Self-pay

## 2020-08-31 NOTE — Telephone Encounter (Signed)
No contraindication though may reduce effectiveness a bit. Recommend he postpone vaccine to the next week if he is able.  Algis Greenhouse. Jerline Pain, MD 08/31/2020 9:10 AM

## 2020-08-31 NOTE — Telephone Encounter (Signed)
Patient is starting prednisone and is scheduled 6/7 for his pneumonia vaccine and would like to know if this is okay and there will be no issues. Please call back at home [phone] LVM patient asked we please leave info on VM if he does not answer

## 2020-08-31 NOTE — Telephone Encounter (Signed)
Please advise 

## 2020-08-31 NOTE — Telephone Encounter (Signed)
Left detailed message on voicemail as requested. Dr. Jerline Pain said he Recommend you postpone vaccine to the next week, No contraindication though may reduce effectiveness a bit. Please call the office to reschedule Nurse visit.

## 2020-09-06 ENCOUNTER — Ambulatory Visit: Payer: Medicare Other

## 2020-09-07 ENCOUNTER — Other Ambulatory Visit: Payer: Self-pay | Admitting: Physician Assistant

## 2020-09-12 ENCOUNTER — Other Ambulatory Visit: Payer: Medicare Other

## 2020-09-12 ENCOUNTER — Ambulatory Visit: Payer: Medicare Other | Admitting: Hematology and Oncology

## 2020-09-29 ENCOUNTER — Ambulatory Visit (INDEPENDENT_AMBULATORY_CARE_PROVIDER_SITE_OTHER): Payer: Medicare Other | Admitting: Podiatry

## 2020-09-29 ENCOUNTER — Encounter: Payer: Self-pay | Admitting: Podiatry

## 2020-09-29 ENCOUNTER — Other Ambulatory Visit: Payer: Self-pay

## 2020-09-29 DIAGNOSIS — M722 Plantar fascial fibromatosis: Secondary | ICD-10-CM | POA: Diagnosis not present

## 2020-09-29 DIAGNOSIS — M7661 Achilles tendinitis, right leg: Secondary | ICD-10-CM

## 2020-09-29 NOTE — Progress Notes (Signed)
  Subjective:  Patient ID: Taylor Berger, male    DOB: 1945-07-19,  MRN: [id number]  Chief Complaint  Patient presents with   Tendonitis      re-check Achilles tendon, recheck plantar fasciitis    75 y.o. male presents with the above complaint. History confirmed with patient.  Has had quite a bit of improvement has been weightbearing in the cam boot  Objective:  Physical Exam: warm, good capillary refill, no trophic changes or ulcerative lesions, normal DP and PT pulses and normal sensory exam. Right Foot: No pain on palpation to plantar and posterior heel and along Achilles tendon today  Radiographs: X-ray of the right foot: no fracture, dislocation, swelling or degenerative changes noted, plantar calcaneal spur and posterior calcaneal spur Assessment:   1. Plantar fasciitis of right foot   2. Achilles tendinitis, right leg      Plan:  Patient was evaluated and treated and all questions answered.  Doing very well from both the plantar fasciitis and Achilles tendinitis standpoint.  I discussed transitioning out of the CAM boot with him over the next few weeks into good supportive shoe gear.  Continue stretching and home therapy exercises until he is pain-free for least 2 weeks.  Hopefully this will limit this over the next couple weeks and he will be done with it.  Return to see me as needed if it returns or worsens  No follow-ups on file.

## 2020-10-27 ENCOUNTER — Ambulatory Visit (INDEPENDENT_AMBULATORY_CARE_PROVIDER_SITE_OTHER): Payer: Medicare Other | Admitting: Gastroenterology

## 2020-10-27 ENCOUNTER — Telehealth: Payer: Self-pay

## 2020-10-27 ENCOUNTER — Encounter: Payer: Self-pay | Admitting: Gastroenterology

## 2020-10-27 VITALS — BP 118/70 | HR 47 | Ht 68.0 in | Wt 204.8 lb

## 2020-10-27 DIAGNOSIS — R1084 Generalized abdominal pain: Secondary | ICD-10-CM

## 2020-10-27 DIAGNOSIS — Z7901 Long term (current) use of anticoagulants: Secondary | ICD-10-CM | POA: Diagnosis not present

## 2020-10-27 DIAGNOSIS — Z86010 Personal history of colonic polyps: Secondary | ICD-10-CM

## 2020-10-27 DIAGNOSIS — Z8719 Personal history of other diseases of the digestive system: Secondary | ICD-10-CM

## 2020-10-27 MED ORDER — ESOMEPRAZOLE MAGNESIUM 20 MG PO CPDR
20.0000 mg | DELAYED_RELEASE_CAPSULE | Freq: Every day | ORAL | 3 refills | Status: DC
Start: 2020-10-27 — End: 2020-11-22

## 2020-10-27 NOTE — Progress Notes (Signed)
Referring Provider: Inda Coke, PA Primary Care Physician:  Inda Coke, PA  Reason for Consultation:  Abdominal pain   IMPRESSION:  Intermittent dysphagia over the last 2-3 months    Barrett's Esophagus diagnosed in [place]    -We will obtain prior records    -Continue daily proton pump inhibitor therapy History of colon polyps    -Tubular adenoma on colonoscopy in [place] 2016    -Patient understands surveillance is due at this time Xarelto for DVT in 2020 and 2021 Recent clinically diagnosed diverticulitis 12/21 in the setting of known diverticulosis    -No abdominal imaging performed at that time    -Treated empirically with Cipro and Flagyl  Intermittent dysphagia in the setting of Barrett's Esophagus: EGD recommended. I have recommended holding Xarelto for 2 days before endoscopy.  I discussed with the patient that there is a low, but real, risk of a cardiovascular event such as heart attack, stroke, or embolism/thrombosis while off Xarelto. Will communicate by phone or EMR with patient's prescribing provider, PA [facility], to confirm that holding the Xarelto is appropriate at this time.   History of diverticulitis: Colonoscopy recommended. No ongoing symptoms related to diverticulitis. Reviewed recommendations to follow a high fiber diet or to use fiber supplements on a regular basis.  However, there is no need to avoid seeds, corn, berries, and nuts. Using NSAIDs may be associated with a moderately increased risk of occurrence of any episode of diverticulitis and complicated diverticulitis.    PLAN: - Reviewed lifestyle recommendations for reflux including weight loss - Daily Metamucil or Benefiber - Continue daily Nexium - EGD and colonoscopy after a Xarelto washout - Inda Coke PA)   Please see the "Patient Instructions" section for addition details about the plan.  I spent 30 minutes, including in depth chart review, independent review of results,  communicating results with the patient directly, face-to-face time with the patient, coordinating care, and ordering studies and medications as appropriate, and documentation.   HPI: Taylor Berger is a 75 y.o. male initially referred by Inda Coke.  He has hypertension, hyperlipidemia, grade 1A follicular lymphoma diagnosed 06/2019, history of diverticulitis, recurrent right lower leg DVT on Xarelto, bronchitis, thyroid goiter.  Initially referred for endoscopy, but, this was deferred due to recent DVT and need for persistent anticoagulation. He returns now, 6 months later, to discuss endoscopy.  He reports at least 4 prior colonoscopies. Most recently, colonoscopy with Dr. Bryna Colander 10/27/2014 in [facility] for history of colon polyps revealed a 3 mm ascending colon tubular adenoma, sigmoid diverticulosis, and small internal hemorrhoids.   History of GERD and Barrett's esophagus diagnosed by Dr. Augustin Coupe. Managed with serial EGDs and proton pump inhibitors, currently on Nexium. Mr. Bacher thinks the last EGD was 2015. No history of stricture or dilation.  No ongoing heartburn, dysphagia, dysphonia, odynophagia, dyspepsia, change in bowel habits.  Diagnosed with diverticulitis in December on a visit to Urgent Care. Known diverticulosis but not prior diverticulitis.  No abdominal imaging performed at the time of that diagnosis.  Treated with Cipro and Flagyl.  December was also complicated by the development of a new lower extremity DVT requiring that he resume Xarelto, bronchitis requiring steroids, and a thyroid mass.  Now with intermittent dysphagia at the sternal notch. Reflux recurs if he doesn't take his PPI.   Father with colon polyps. No other known family history of colon cancer or polyps. No family history of uterine/endometrial cancer, pancreatic cancer or gastric/stomach cancer.  Labs 06/01/20: normal  CMP except for glucose of 107, WBC 6.2, hgb 12.9, platelets 194   Past Medical  History:  Diagnosis Date   Allergy    Seasonal   Aortic aneurysm (HCC)    Depression    DVT (deep venous thrombosis) ([place]) 2020   GERD (gastroesophageal reflux disease)    Goiter    Hyperlipidemia    Hypertension    Lymphoma ([place]) [id number]   Follicular, stomach   Neuropathy of both feet     Past Surgical History:  Procedure Laterality Date   CARPAL TUNNEL RELEASE Left    CERVICAL DISCECTOMY  1995   C-7   CERVICAL FUSION  01/1989   C-3 through C-6 / no metal   COLONOSCOPY     ELBOW SURGERY Left    FOOT SURGERY Left 1999   Plantar fascitis   low back fusion  2013   Lumbar & Low Thoracic with metal   SHOULDER ARTHROSCOPY WITH ROTATOR CUFF REPAIR Right 2014   UPPER GASTROINTESTINAL ENDOSCOPY      Current Outpatient Medications  Medication Sig Dispense Refill   amLODipine (NORVASC) 10 MG tablet Take 1 tablet by mouth once daily 90 tablet 0   atorvastatin (LIPITOR) 40 MG tablet Take 1 tablet by mouth once daily 90 tablet 0   cetirizine (ZYRTEC) 10 MG tablet Take 10 mg by mouth daily.     clindamycin (CLEOCIN-T) 1 % external solution Apply topically 2 (two) times daily. (Patient taking differently: Apply topically as needed.) 30 mL 0   esomeprazole (NEXIUM) 20 MG capsule Take 1 capsule (20 mg total) by mouth daily at 12 noon. 90 capsule 3   fluticasone (FLONASE) 50 MCG/ACT nasal spray Place into both nostrils as needed for allergies or rhinitis.     lisinopril (ZESTRIL) 5 MG tablet Take 1 tablet by mouth once daily 90 tablet 0   rivaroxaban (XARELTO) 20 MG TABS tablet Take 1 tablet (20 mg total) by mouth daily with supper. 90 tablet 1   valACYclovir (VALTREX) 1000 MG tablet Take 1 tablet (1,000 mg total) by mouth daily. 90 tablet 1   No current facility-administered medications for this visit.    Allergies as of 10/27/2020   (No Known Allergies)    Family History  Problem Relation Age of Onset   Diabetes Mother    Hypertension Mother    Hyperlipidemia Mother    Heart  failure Mother    Leukemia Father    Stroke Father    Esophageal cancer Neg Hx    Colon polyps Neg Hx    Pancreatic cancer Neg Hx    Stomach cancer Neg Hx       Physical Exam: General:   Alert,  well-nourished, pleasant and cooperative in NAD Head:  Normocephalic and atraumatic. Eyes:  Sclera clear, no icterus.   Conjunctiva pink. Ears:  Normal auditory acuity. Nose:  No deformity, discharge,  or lesions. Mouth:  No deformity or lesions.   Neck:  Supple; no masses or thyromegaly. Lungs:  Clear throughout to auscultation.   No wheezes. Heart:  Regular rate and rhythm; no murmurs. Abdomen:  Soft, nontender, nondistended, normal bowel sounds, no rebound or guarding. No hepatosplenomegaly.   Rectal:  Deferred to upcoming colonoscopy Msk:  Symmetrical. No boney deformities LAD: No inguinal or umbilical LAD Extremities:  No clubbing or edema. Neurologic:  Alert and  oriented x4;  grossly nonfocal Skin:  Intact without significant lesions or rashes. Psych:  Alert and cooperative. Normal mood and affect.  Mykelti Goldenstein L. Tarri Glenn, MD, MPH 10/27/2020, 10:16 AM

## 2020-10-27 NOTE — Telephone Encounter (Signed)
[facility] Pre-operative Risk Assessment     Sie Formisano 1945/06/30 [id number]  Procedure: EGD and colonoscopy Anesthesia type:  MAC Procedure Date: 11/17/20 Provider: Dr. Tarri Glenn  Type of Clearance needed: Pharmacy - Xarelto  Medication(s) needing held: Xarelto   Length of time for medication to be held: 5 days  Please review request and advise by either responding to this message or by sending your response to the fax # provided below.  Thank you,  [facility]  Phone: [phone] Fax: [phone] ATTENTION: Amori Cooperman, LPN

## 2020-10-27 NOTE — Telephone Encounter (Signed)
Called pt and informed about Dr. Marigene Ehlers response below. Verbalized acceptance and understanding.

## 2020-10-27 NOTE — Patient Instructions (Addendum)
It was my pleasure to provide care to you today. Based on our discussion, I am providing you with my recommendations below:  RECOMMENDATION(S):   Please take Metamucil or Benefiber daily Continue Nexium daily  COLONOSCOPY AND ENDOSCOPY:   You have been scheduled for an endoscopy and a colonoscopy. Please follow the written instructions given to you at your visit today.  PREP:   Please pick up your prep supplies at the pharmacy within the next 1-3 days.  INHALERS:   If you use inhalers (even only as needed), please bring them with you on the day of your procedure.  MEDICATIONS TO HOLD:  We will contact your provider to request permission for you to hold XARELTO. Once we receive a response, you will be contacted by our office. If you do not hear from our office 1 week prior to your scheduled procedure, please contact our office at [phone].   COLONOSCOPY TIPS:  To reduce nausea and dehydration, stay well hydrated for 3-4 days prior to the exam.  To prevent skin/hemorrhoid irritation - prior to wiping, put A&Dointment or vaseline on the toilet paper. Keep a towel or pad on the bed.  BEFORE STARTING YOUR PREP, drink  64oz of clear liquids in the morning. This will help to flush the colon and will ensure you are well hydrated!!!!  NOTE - This is in addition to the fluids required for to complete your prep. Use of a flavored hard candy, such as grape Anise Salvo, can counteract some of the flavor of the prep and may prevent some nausea.   FOLLOW UP:  After your procedure, you will receive a call from my office staff regarding my recommendation for follow up.  BMI:  If you are age 82 or older, your body mass index should be between 23-30. Your Body mass index is 31.14 kg/m. If this is out of the aforementioned range listed, please consider follow up with your Primary Care Provider.  MY CHART:  The [facility] providers would like to encourage you to use [facility] to  communicate with providers for non-urgent requests or questions.  Due to long hold times on the telephone, sending your provider a message by [facility] may be a faster and more efficient way to get a response.  Please allow 48 business hours for a response.  Please remember that this is for non-urgent requests.   Thank you for trusting me with your gastrointestinal care!    Thornton Park, MD, MPH

## 2020-10-27 NOTE — Telephone Encounter (Signed)
Yes that is ok.  Algis Greenhouse. Jerline Pain, MD 10/27/2020 1:09 PM

## 2020-11-08 NOTE — Progress Notes (Signed)
Cardiology Clinic Note   Patient Name: Taylor Berger Date of Encounter: 11/10/2020  Primary Care Provider:  Inda Coke, [place] Primary Cardiologist:  Elouise Munroe, MD  Patient Profile    Taylor Berger 75 year old male presents to the clinic today for follow-up of his chest discomfort.  Past Medical History    Past Medical History:  Diagnosis Date   Allergy    Seasonal   Aortic aneurysm (HCC)    Depression    DVT (deep venous thrombosis) ([place]) 2020   GERD (gastroesophageal reflux disease)    Goiter    Hyperlipidemia    Hypertension    Lymphoma ([place]) [id number]   Follicular, stomach   Neuropathy of both feet    Past Surgical History:  Procedure Laterality Date   CARPAL TUNNEL RELEASE Left    CERVICAL DISCECTOMY  1995   C-7   CERVICAL FUSION  01/1989   C-3 through C-6 / no metal   COLONOSCOPY     ELBOW SURGERY Left    FOOT SURGERY Left 1999   Plantar fascitis   low back fusion  2013   Lumbar & Low Thoracic with metal   SHOULDER ARTHROSCOPY WITH ROTATOR CUFF REPAIR Right 2014   UPPER GASTROINTESTINAL ENDOSCOPY      Allergies  No Known Allergies  History of Present Illness    Taylor Berger is a PMH of DVT, depression, GERD, hyperlipidemia, hypertension, lipoma, HTN and bilateral lower extremity neuropathy.  He also has a known thoracic aortic aneurysm was measured at 4.2 cm 2/20.  He was seen by Dr.Acharya 11/21 and was noted to have occasional substernal nonradiating chest discomfort.  At that time his blood pressure was reasonably well controlled on lisinopril and amlodipine.  He was taking atorvastatin for his hyperlipidemia and his GERD was treated with Nexium.  He denied DOE, palpitations, PND, and orthopnea.  He denied lower extremity edema.  He denied cough fever and chills.  He denied snoring.  His EKG at that time showed sinus bradycardia with first-degree AV block with left axis deviation and LVH.  His blood pressure was 134/74.  It was felt that his chest  discomfort was related to precordial type chest discomfort.  He underwent coronary CTA 04/13/2020.  It showed mild-moderate coronary disease.  His CT FFR showed no hemodynamically significant stenosis and medical management was recommended.  He was noted to have borderline dilation of his aorta measuring 39 mm.  Moderate dilation of his pulmonary artery was noted which is suspicious for pulmonary hypertension.  He presents to the clinic today for follow-up evaluation states he feels well.  He occasionally has back and intermittent chest aches related to his lumbar and cervical spinal disease.  He is in the process of moving into a condo/townhouse and reports that he has not been very physically active.  He is trying to follow a low-sodium heart healthy diet.  We reviewed his coronary CTA and chest/abdomen CT.  He expressed understanding.  He reports that he would like to lose about 30 pounds and has a goal of becoming more physically active.  I will give him the salty 6 diet sheet, have him increase his physical activity as tolerated with a goal of achieving 150 minutes of moderate physical activity per week, give the [employer] support stocking sheet, and have him follow-up in 6 months.  Today he denies chest pain, shortness of breath, lower extremity edema, fatigue, palpitations, melena, hematuria, hemoptysis, diaphoresis, weakness, presyncope, syncope, orthopnea, and PND.   Home  Medications    Prior to Admission medications   Medication Sig Start Date End Date Taking? Authorizing Provider  amLODipine (NORVASC) 10 MG tablet Take 1 tablet by mouth once daily 09/08/20   Vivi Barrack, MD  atorvastatin (LIPITOR) 40 MG tablet Take 1 tablet by mouth once daily 09/08/20   Vivi Barrack, MD  cetirizine (ZYRTEC) 10 MG tablet Take 10 mg by mouth daily.    [provider]  clindamycin (CLEOCIN-T) 1 % external solution Apply topically 2 (two) times daily. Patient taking differently: Apply topically as  needed. 02/05/20   Inda Coke, PA  esomeprazole (NEXIUM) 20 MG capsule Take 1 capsule (20 mg total) by mouth daily at 12 noon. 10/27/20   Thornton Park, MD  fluticasone (FLONASE) 50 MCG/ACT nasal spray Place into both nostrils as needed for allergies or rhinitis.    [provider]  lisinopril (ZESTRIL) 5 MG tablet Take 1 tablet by mouth once daily 09/08/20   Vivi Barrack, MD  rivaroxaban (XARELTO) 20 MG TABS tablet Take 1 tablet (20 mg total) by mouth daily with supper. 04/12/20   Inda Coke, PA  valACYclovir (VALTREX) 1000 MG tablet Take 1 tablet (1,000 mg total) by mouth daily. 08/18/20   Vivi Barrack, MD    Family History    Family History  Problem Relation Age of Onset   Diabetes Mother    Hypertension Mother    Hyperlipidemia Mother    Heart failure Mother    Leukemia Father    Stroke Father    Esophageal cancer Neg Hx    Colon polyps Neg Hx    Pancreatic cancer Neg Hx    Stomach cancer Neg Hx    He indicated that his mother is deceased. He indicated that his father is deceased. He indicated that both of his brothers are alive. He indicated that the status of his neg hx is unknown.  Social History    Social History   Socioeconomic History   Marital status: Married    Spouse name: Not on file   Number of children: Not on file   Years of education: Not on file   Highest education level: Not on file  Occupational History   Occupation: retired   Tobacco Use   Smoking status: Never   Smokeless tobacco: Never  Vaping Use   Vaping Use: Never used  Substance and Sexual Activity   Alcohol use: Not Currently   Drug use: Never   Sexual activity: Not Currently  Other Topics Concern   Not on file  Social History Narrative   ** Merged History Encounter **       Moved from [place] to be closer to son Married   Social Determinants of Radio broadcast assistant Strain: Low Risk    Difficulty of Paying Living Expenses: Not hard at all  Food  Insecurity: No Food Insecurity   Worried About Charity fundraiser in the Last Year: Never true   Arboriculturist in the Last Year: Never true  Transportation Needs: No Transportation Needs   Lack of Transportation (Medical): No   Lack of Transportation (Non-Medical): No  Physical Activity: Inactive   Days of Exercise per Week: 0 days   Minutes of Exercise per Session: 0 min  Stress: No Stress Concern Present   Feeling of Stress : Not at all  Social Connections: Moderately Isolated   Frequency of Communication with Friends and Family: Three times a week   Frequency  of Social Gatherings with Friends and Family: Once a week   Attends Religious Services: Never   Marine scientist or Organizations: No   Attends Music therapist: Never   Marital Status: Married  Human resources officer Violence: Not At Risk   Fear of Current or Ex-Partner: No   Emotionally Abused: No   Physically Abused: No   Sexually Abused: No     Review of Systems    General:  No chills, fever, night sweats or weight changes.  Cardiovascular:  No chest pain, dyspnea on exertion, edema, orthopnea, palpitations, paroxysmal nocturnal dyspnea. Dermatological: No rash, lesions/masses Respiratory: No cough, dyspnea Urologic: No hematuria, dysuria Abdominal:   No nausea, vomiting, diarrhea, bright red blood per rectum, melena, or hematemesis Neurologic:  No visual changes, wkns, changes in mental status. All other systems reviewed and are otherwise negative except as noted above.  Physical Exam    VS:  BP 116/70   Pulse 69   Resp 18   Ht '5\' 8"'$  (1.727 m)   Wt 209 lb 3.2 oz (94.9 kg)   SpO2 97%   BMI 31.81 kg/m  , BMI Body mass index is 31.81 kg/m. GEN: Well nourished, well developed, in no acute distress. HEENT: normal. Neck: Supple, no JVD, carotid bruits, or masses. Cardiac: RRR, no murmurs, rubs, or gallops. No clubbing, cyanosis, generalized bilateral lower extremity nonpitting edema.   Radials/DP/PT 2+ and equal bilaterally.  Respiratory:  Respirations regular and unlabored, clear to auscultation bilaterally. GI: Soft, nontender, nondistended, BS + x 4. MS: no deformity or atrophy. Skin: warm and dry, no rash. Neuro:  Strength and sensation are intact. Psych: Normal affect.  Accessory Clinical Findings    Recent Labs: 06/01/2020: ALT 25; BUN 17; Creatinine 0.95; Hemoglobin 12.9; Platelet Count 194; Potassium 4.1; Sodium 140   Recent Lipid Panel    Component Value Date/Time   CHOL 140 04/22/2019 0000   TRIG 135 04/22/2019 0000   LDLCALC 76 04/22/2019 0000    ECG personally reviewed by me today-none today.  Coronary CTA 04/13/2020 FINDINGS: Image quality: average   Noise artifact is: Moderate motion artifact   Coronary calcium score is 302, which places the patient in the 52nd percentile for age and sex matched control.   Coronary arteries: Normal coronary origins.  Right dominance.   Right Coronary Artery: Minimal mixed atherosclerotic plaque in proximal and distal RCA, <25%. There is a mild mixed atherosclerotic plaque in the proximal to mid RCA, 25-49% stenosis. Given significant motion artifact in this region, this may be artifact, though it appears to persist in most phases and likely represents true plaque.   Left Main Coronary Artery: No detectable plaque or stenosis.   Left Anterior Descending Coronary Artery: Moderate mixed atherosclerotic plaque in the proximal LAD, 50-69% stenosis. Mild mixed atherosclerotic plaque in the mid LAD, 25-49% stenosis.   Left Circumflex Artery: Minimal mixed atherosclerotic plaque in the nondominant circumflex and OM1, <25% stenosis.   Aorta:   Measurements made in double oblique technique:   Sinus of Valsalva:   R-L: 33 mm   L-Non: 35 mm   R-Non: 35 mm   ST junction: 29 mm   Mid ascending aorta (at PA bifurcation): 39 mm   Distal ascending aorta (just proximal to right brachiocephalic artery): 36  mm   Arch (between left common carotid and left subclavian arteries): 30 mm   Proximal descending aorta: 29 mm   Mid descending aorta (at level of pulmonary veins): 28 mm  Distal descending aorta (just proximal to aortic hiatus): 28 mm   Aortic Valve: No calcifications.   Other findings:   Normal pulmonary vein drainage into the left atrium.   Normal left atrial appendage without a thrombus.   Moderately dilated main pulmonary artery, suggestive of pulmonary hypertension. 34 mm.   IMPRESSION: 1. Moderate CAD in proximal LAD, CADRADS = 3. CT FFR will be performed and reported separately.   2. Coronary calcium score is 302, which places the patient in the 52nd percentile for age and sex matched control.   3. Normal coronary origin with right dominance.   4. Borderline dilation of ascending aorta, 39 mm at mid ascending aorta.   5. Moderately dilated main pulmonary artery, 34 mm, suggestive of pulmonary hypertension.  CT FFR 04/14/2020 FINDINGS: FFRct analysis was performed on the original cardiac CT angiogram dataset. Diagrammatic representation of the FFRct analysis is provided in a separate PDF document in PACS. This dictation was created using the PDF document and an interactive 3D model of the results. 3D model is not available in the EMR/PACS. Normal FFR range is >0.80. Indeterminate (grey) zone is 0.76-0.80.   1. Left Main: FFR = 0.97   2. LAD: Proximal FFR = 0.95, Mid FFR = 0.91, distal FFR = 0.84 3. LCX: Proximal FFR = 0.99, distal FFR = 0.98, OM1 FFR = 0.93 4. RCA: Proximal FFR = 0.96, Mid FFR =0.95, Distal FFR = 0.95   IMPRESSION: 1.  CT FFR analysis showed no significant stenosis.   RECOMMENDATIONS: Guideline-directed medical therapy and aggressive risk factor modification for secondary prevention of coronary artery disease.  Assessment & Plan   1.  Coronary artery disease-denies recurrent chest pain.  Underwent coronary CTA with FFR.  Analysis  showed mild-moderate coronary artery disease with no significant stenosis.  No aspirin Continue atorvastatin, Xarelto Heart healthy low-sodium diet-salty 6 given Increase physical activity as tolerated  Essential hypertension-BP today 116/70.  Well-controlled at home. Continue amlodipine, lisinopril Heart healthy low-sodium diet-salty 6 given Increase physical activity as tolerated  Hyperlipidemia-LDL  76 on 04/22/2019 Continue atorvastatin Heart healthy low-sodium high-fiber diet Increase physical activity as tolerated  Thoracic aortic aneurysm-denies recent episodes of lower chest and back discomfort.  Previously measured at 4.2 cm.  Not mentioned on abdomen pelvis CT 04/13/2020.  Disposition: Follow-up with Dr.Acharya in 6 months.  Jossie Ng. Rayyan Orsborn NP-C    11/10/2020, 9:16 AM [place] [address] Office [phone] Fax [phone]  Notice: This dictation was prepared with Dragon dictation along with smaller phrase technology. Any transcriptional errors that result from this process are unintentional and may not be corrected upon review.  I spent 15 minutes examining this patient, reviewing medications, and using patient centered shared decision making involving her cardiac care.  Prior to her visit I spent greater than 20 minutes reviewing her past medical history,  medications, and prior cardiac tests.

## 2020-11-10 ENCOUNTER — Encounter: Payer: Self-pay | Admitting: General Practice

## 2020-11-10 ENCOUNTER — Ambulatory Visit (INDEPENDENT_AMBULATORY_CARE_PROVIDER_SITE_OTHER): Payer: Medicare Other | Admitting: General Practice

## 2020-11-10 ENCOUNTER — Other Ambulatory Visit: Payer: Self-pay

## 2020-11-10 VITALS — BP 116/70 | HR 69 | Resp 18 | Ht 68.0 in | Wt 209.2 lb

## 2020-11-10 DIAGNOSIS — I712 Thoracic aortic aneurysm, without rupture, unspecified: Secondary | ICD-10-CM

## 2020-11-10 DIAGNOSIS — E785 Hyperlipidemia, unspecified: Secondary | ICD-10-CM

## 2020-11-10 DIAGNOSIS — I10 Essential (primary) hypertension: Secondary | ICD-10-CM | POA: Diagnosis not present

## 2020-11-10 DIAGNOSIS — I2510 Atherosclerotic heart disease of native coronary artery without angina pectoris: Secondary | ICD-10-CM | POA: Diagnosis not present

## 2020-11-10 NOTE — Patient Instructions (Addendum)
Medication Instructions:  The current medical regimen is effective;  continue present plan and medications as directed. Please refer to the Current Medication list given to you today.   *If you need a refill on your cardiac medications before your next appointment, please call your pharmacy*  Lab Work:   Testing/Procedures:  NONE    NONE  Special Instructions PLEASE READ AND FOLLOW SALTY 6-ATTACHED-1,'800mg'$  daily  PLEASE INCREASE PHYSICAL ACTIVITY AS TOLERATED-SLOWLY, GOAL IS 150 MINUTES WEEKLY NO MORE THAN 10% INCREASE WEEKLY   PLEASE PURCHASE AND WEAR COMPRESSION STOCKINGS DAILY AND TAKE OFF AT BEDTIME. Compression stockings are elastic socks that squeeze the legs. They help to increase blood flow to the legs and to decrease swelling in the legs from fluid retention, and reduce the chance of developing blood clots in the lower legs. Please put on in the AM when dressing and off at night when dressing for bed.  LET THEM KNOW THAT YOU NEED KNEE HIGH'S WITH COMPRESSION OF 15-20 mmhg.  [employer]  [place] ([employer] [phone]); [place], [place] [address]; [phone]  EMAIL   [email or web address].  PLEASE MAKE SURE TO ELEVATE YOUR FEET & LEGS WHILE SITTING, THIS WILL HELP WITH THE SWELLING ALSO.   Follow-Up: Your next appointment:  6 month(s) In Person with Cherlynn Kaiser, MD   At [facility], you and your health needs are our priority.  As part of our continuing mission to provide you with exceptional heart care, we have created designated Provider Care Teams.  These Care Teams include your primary Cardiologist (physician) and Advanced Practice Providers (APPs -  Physician Assistants and Nurse Practitioners) who all work together to provide you with the care you need, when you need it.            6 SALTY THINGS TO AVOID     1,'800MG'$  DAILY

## 2020-11-16 ENCOUNTER — Encounter: Payer: Self-pay | Admitting: Certified Registered Nurse Anesthetist

## 2020-11-17 ENCOUNTER — Other Ambulatory Visit: Payer: Self-pay

## 2020-11-17 ENCOUNTER — Emergency Department (HOSPITAL_COMMUNITY): Payer: Medicare Other

## 2020-11-17 ENCOUNTER — Ambulatory Visit (AMBULATORY_SURGERY_CENTER): Payer: Medicare Other | Admitting: Gastroenterology

## 2020-11-17 ENCOUNTER — Encounter (HOSPITAL_COMMUNITY): Payer: Self-pay | Admitting: Emergency Medicine

## 2020-11-17 ENCOUNTER — Encounter: Payer: Self-pay | Admitting: Gastroenterology

## 2020-11-17 ENCOUNTER — Telehealth: Payer: Self-pay | Admitting: Gastroenterology

## 2020-11-17 ENCOUNTER — Emergency Department (HOSPITAL_COMMUNITY)
Admission: EM | Admit: 2020-11-17 | Discharge: 2020-11-17 | Disposition: A | Discharge status: Home / Self Care | Payer: Medicare Other | Attending: Emergency Medicine | Admitting: Emergency Medicine

## 2020-11-17 VITALS — BP 141/80 | HR 88 | Temp 98.4°F | Resp 21 | Ht 68.0 in | Wt 204.0 lb

## 2020-11-17 DIAGNOSIS — R1310 Dysphagia, unspecified: Secondary | ICD-10-CM

## 2020-11-17 DIAGNOSIS — Z9889 Other specified postprocedural states: Secondary | ICD-10-CM

## 2020-11-17 DIAGNOSIS — K317 Polyp of stomach and duodenum: Secondary | ICD-10-CM | POA: Diagnosis not present

## 2020-11-17 DIAGNOSIS — Z7901 Long term (current) use of anticoagulants: Secondary | ICD-10-CM | POA: Insufficient documentation

## 2020-11-17 DIAGNOSIS — Z8719 Personal history of other diseases of the digestive system: Secondary | ICD-10-CM

## 2020-11-17 DIAGNOSIS — R109 Unspecified abdominal pain: Secondary | ICD-10-CM | POA: Diagnosis present

## 2020-11-17 DIAGNOSIS — D124 Benign neoplasm of descending colon: Secondary | ICD-10-CM

## 2020-11-17 DIAGNOSIS — R1084 Generalized abdominal pain: Secondary | ICD-10-CM

## 2020-11-17 DIAGNOSIS — K3189 Other diseases of stomach and duodenum: Secondary | ICD-10-CM | POA: Diagnosis not present

## 2020-11-17 DIAGNOSIS — R011 Cardiac murmur, unspecified: Secondary | ICD-10-CM | POA: Diagnosis not present

## 2020-11-17 DIAGNOSIS — I10 Essential (primary) hypertension: Secondary | ICD-10-CM | POA: Insufficient documentation

## 2020-11-17 DIAGNOSIS — K2970 Gastritis, unspecified, without bleeding: Secondary | ICD-10-CM | POA: Diagnosis not present

## 2020-11-17 DIAGNOSIS — K219 Gastro-esophageal reflux disease without esophagitis: Secondary | ICD-10-CM | POA: Diagnosis not present

## 2020-11-17 DIAGNOSIS — Z48815 Encounter for surgical aftercare following surgery on the digestive system: Secondary | ICD-10-CM | POA: Insufficient documentation

## 2020-11-17 DIAGNOSIS — D122 Benign neoplasm of ascending colon: Secondary | ICD-10-CM | POA: Diagnosis not present

## 2020-11-17 DIAGNOSIS — Z79899 Other long term (current) drug therapy: Secondary | ICD-10-CM | POA: Insufficient documentation

## 2020-11-17 DIAGNOSIS — Z86010 Personal history of colonic polyps: Secondary | ICD-10-CM

## 2020-11-17 LAB — CBC WITH DIFFERENTIAL/PLATELET
Abs Immature Granulocytes: 0.06 10*3/uL (ref 0.00–0.07)
Basophils Absolute: 0.1 10*3/uL (ref 0.0–0.1)
Basophils Relative: 1 %
Eosinophils Absolute: 0.1 10*3/uL (ref 0.0–0.5)
Eosinophils Relative: 1 %
HCT: 43.7 % (ref 39.0–52.0)
Hemoglobin: 14.7 g/dL (ref 13.0–17.0)
Immature Granulocytes: 1 %
Lymphocytes Relative: 14 %
Lymphs Abs: 1.2 10*3/uL (ref 0.7–4.0)
MCH: 32.4 pg (ref 26.0–34.0)
MCHC: 33.6 g/dL (ref 30.0–36.0)
MCV: 96.3 fL (ref 80.0–100.0)
Monocytes Absolute: 0.5 10*3/uL (ref 0.1–1.0)
Monocytes Relative: 6 %
Neutro Abs: 6.6 10*3/uL (ref 1.7–7.7)
Neutrophils Relative %: 77 %
Platelets: 203 10*3/uL (ref 150–400)
RBC: 4.54 MIL/uL (ref 4.22–5.81)
RDW: 12.5 % (ref 11.5–15.5)
WBC: 8.4 10*3/uL (ref 4.0–10.5)
nRBC: 0 % (ref 0.0–0.2)

## 2020-11-17 LAB — URINALYSIS, ROUTINE W REFLEX MICROSCOPIC
Bilirubin Urine: NEGATIVE
Glucose, UA: NEGATIVE mg/dL
Hgb urine dipstick: NEGATIVE
Ketones, ur: 20 mg/dL — AB
Nitrite: NEGATIVE
Protein, ur: NEGATIVE mg/dL
Specific Gravity, Urine: 1.011 (ref 1.005–1.030)
pH: 5 (ref 5.0–8.0)

## 2020-11-17 LAB — COMPREHENSIVE METABOLIC PANEL
ALT: 31 U/L (ref 0–44)
AST: 25 U/L (ref 15–41)
Albumin: 4.4 g/dL (ref 3.5–5.0)
Alkaline Phosphatase: 70 U/L (ref 38–126)
Anion gap: 11 (ref 5–15)
BUN: 17 mg/dL (ref 8–23)
CO2: 21 mmol/L — ABNORMAL LOW (ref 22–32)
Calcium: 9.9 mg/dL (ref 8.9–10.3)
Chloride: 109 mmol/L (ref 98–111)
Creatinine, Ser: 1.13 mg/dL (ref 0.61–1.24)
GFR, Estimated: >60 mL/min (ref >60)
Glucose, Bld: 144 mg/dL — ABNORMAL HIGH (ref 70–99)
Potassium: 3.7 mmol/L (ref 3.5–5.1)
Sodium: 141 mmol/L (ref 135–145)
Total Bilirubin: 1.8 mg/dL — ABNORMAL HIGH (ref 0.3–1.2)
Total Protein: 7.2 g/dL (ref 6.5–8.1)

## 2020-11-17 LAB — LIPASE, BLOOD: Lipase: 35 U/L (ref 11–51)

## 2020-11-17 MED ORDER — SODIUM CHLORIDE 0.9 % IV SOLN: 500.0000 mL | Freq: Once | INTRAVENOUS | Status: DC

## 2020-11-17 MED ORDER — IOHEXOL 350 MG/ML SOLN
75.0000 mL | Freq: Once | INTRAVENOUS | Status: AC | PRN
  Administered 2020-11-17: 75 mL via INTRAVENOUS

## 2020-11-17 NOTE — Progress Notes (Signed)
Called to room to assist during endoscopic procedure.  Patient ID and intended procedure confirmed with present staff. Received instructions for my participation in the procedure from the performing physician.  

## 2020-11-17 NOTE — ED Notes (Signed)
Patient transported to CT via stretcher.

## 2020-11-17 NOTE — Progress Notes (Signed)
0731 Robinul 0.1 mg IV given due large amount of secretions upon assessment.  MD made aware, vss 

## 2020-11-17 NOTE — Discharge Instructions (Addendum)
Fortunately CT scan today did not show any concerning finding related to the recent endoscopy and colonoscopy procedure.  Incidentally there is a 0.7 cm cystic lesion in the tail of the pancreas on CT.  Please discuss this finding with your primary care provider as will be beneficial to have a dedicated pancreas CT scan or MRI in the next 2 years for surveillance.  Return to the ER if you have any concern.

## 2020-11-17 NOTE — ED Notes (Signed)
Pt ambulated to RR w/o assistance, attempting to provide urine sample. States he is passing gas in room and feeling 'a lot better.'

## 2020-11-17 NOTE — Op Note (Signed)
[facility] Patient Name: Taylor Berger Procedure Date: 11/17/2020 7:26 AM MRN: [id number] Endoscopist: Thornton Park MD, MD Age: 75 Referring MD:  Date of Birth: 1946/02/14 Gender: Male Account #: [id number] Procedure:                Upper GI endoscopy Indications:              Dysphagia, Follow-up of Barrett's esophagus                           Intermittent dysphagia over the last 2-3 months                           Barrett's Esophagus diagnosed in [place]                           -We will obtain prior records                           -Continue daily proton pump inhibitor therapy                           Xarelto for DVT in 2020 and 2021 Medicines:                Monitored Anesthesia Care Procedure:                Pre-Anesthesia Assessment:                           - Prior to the procedure, a History and Physical                            was performed, and patient medications and                            allergies were reviewed. The patient's tolerance of                            previous anesthesia was also reviewed. The risks                            and benefits of the procedure and the sedation                            options and risks were discussed with the patient.                            All questions were answered, and informed consent                            was obtained. Prior Anticoagulants: The patient has                            taken Xarelto (rivaroxaban), last dose was 2 days  prior to procedure. ASA Grade Assessment: III - A                            patient with severe systemic disease. After                            reviewing the risks and benefits, the patient was                            deemed in satisfactory condition to undergo the                            procedure.                           After obtaining informed consent, the endoscope was                            passed under direct  vision. Throughout the                            procedure, the patient's blood pressure, pulse, and                            oxygen saturations were monitored continuously. The                            GIF [id number] was introduced through the                            mouth, and advanced to the third part of duodenum.                            The upper GI endoscopy was accomplished without                            difficulty. The patient tolerated the procedure                            well. Scope In: Scope Out: Findings:                 No endoscopic abnormality was evident in the                            esophagus to explain the patient's complaint of                            dysphagia. The z-line is present 38 cm from the                            incisors. No obvious Barrett's Esophagus. It was                            decided, however, to  proceed with dilation of the                            lower third of the esophagus. A TTS dilator was                            passed through the scope. Dilation with a 16-17-18                            mm balloon dilator was performed to 18 mm. A fully                            inflated balloon was pulled through the esophagus                            with no resistance. The dilation site was examined                            and showed no change. Biopsies were then obtained                            from the mid/proximal and distal esophagus with                            cold forceps for histology of suspected                            eosinophilic esophagitis. Estimated blood loss was                            minimal.                           Diffuse minimal inflammation characterized by                            erythema, friability and granularity was found in                            the gastric body. Biopsies were taken from the                            antrum, body, and fundus with a cold  forceps for                            histology. Estimated blood loss was minimal.                           The examined duodenum was normal.                           The cardia and gastric fundus were normal on  retroflexion.                           The exam was otherwise without abnormality. Complications:            No immediate complications. Estimated blood loss:                            Minimal. Estimated Blood Loss:     Estimated blood loss was minimal. Impression:               - No endoscopic esophageal abnormality to explain                            patient's dysphagia. Esophagus dilated. Dilated.                            Biopsied.                           - Gastritis. Biopsied.                           - Normal examined duodenum.                           - The examination was otherwise normal. Recommendation:           - Patient has a contact number available for                            emergencies. The signs and symptoms of potential                            delayed complications were discussed with the                            patient. Return to normal activities tomorrow.                            Written discharge instructions were provided to the                            patient.                           - Resume previous diet.                           - Continue present medications.                           - Await pathology results.                           - No aspirin, ibuprofen, naproxen, or other                            non-steroidal anti-inflammatory drugs. Thornton Park  MD, MD 11/17/2020 8:22:57 AM This report has been signed electronically.

## 2020-11-17 NOTE — ED Provider Notes (Signed)
[facility] DEPT Provider Note   CSN: [id number] Arrival date & time: 11/17/20  1041     History Chief Complaint  Patient presents with   Abdominal Pain    Taylor Berger is a 75 y.o. male.  The history is provided by the patient and medical records. No language interpreter was used.  Abdominal Pain  75 year old male significant history of Barrett's esophagus who has intermittent dysphagia and had an endoscopy and colonoscopy today presenting complaining of abdominal pain.  Patient states he had a routine endoscopy and colonoscopy performed by Dr. Modena Nunnery this morning.  Afterward he did notice some mild abdominal discomfort but was unable to pass flatus.  He described his abdominal discomfort as some tightness sensation rates as 4 out of 10 and feeling a bit constipated.  He endorsed mild nausea without vomiting.  No fever chills chest pain shortness of breath or productive cough.  No rectal bleeding.  Was on Xarelto but have discontinued for the past 5 days in preparation for the procedure.  No other complaint.  He did mention several polyps which were removed from the procedure.  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 ([facility]) 03/07/2020   Hypertension 01/29/2020   Thoracic aortic aneurysm ([facility]) 01/29/2020   DVT R femoral thigh 01/29/2020   GERD (gastroesophageal reflux disease) 01/29/2020   History of Barrett's esophagus 01/29/2020   Goiter 01/29/2020   Neuropathy of both feet    Lymphoma ([facility]) 2020    Past Surgical History:  Procedure Laterality Date   CARPAL TUNNEL  RELEASE Left    CERVICAL DISCECTOMY  1995   C-7   CERVICAL FUSION  01/1989   C-3 through C-6 / no metal   COLONOSCOPY     ELBOW SURGERY Left    FOOT SURGERY Left 1999   Plantar fascitis   low back fusion  2013   Lumbar & Low Thoracic with metal   SHOULDER ARTHROSCOPY WITH ROTATOR CUFF REPAIR Right 2014   UPPER GASTROINTESTINAL ENDOSCOPY         Family History  Problem Relation Age of Onset   Diabetes Mother    Hypertension Mother    Hyperlipidemia Mother    Heart failure Mother    Leukemia Father    Stroke Father    Esophageal cancer Neg Hx    Colon polyps Neg Hx    Pancreatic cancer Neg Hx    Stomach cancer Neg Hx    Rectal cancer Neg Hx     Social History   Tobacco Use   Smoking status: Never   Smokeless tobacco: Never  Vaping Use   Vaping Use: Never used  Substance Use Topics   Alcohol use: Not Currently   Drug use: Never    Home Medications Prior to Admission medications   Medication Sig Start Date End Date Taking? Authorizing Provider  amLODipine (NORVASC) 10 MG tablet Take 1 tablet by mouth once daily 09/08/20   Vivi Barrack, MD  atorvastatin (LIPITOR) 40 MG tablet Take 1 tablet by  mouth once daily 09/08/20   Vivi Barrack, MD  cetirizine (ZYRTEC) 10 MG tablet Take 10 mg by mouth daily.    [provider]  clindamycin (CLEOCIN-T) 1 % external solution Apply topically 2 (two) times daily. Patient taking differently: Apply topically as needed. 02/05/20   Inda Coke, PA  esomeprazole (NEXIUM) 20 MG capsule Take 1 capsule (20 mg total) by mouth daily at 12 noon. 10/27/20   Thornton Park, MD  fluticasone (FLONASE) 50 MCG/ACT nasal spray Place into both nostrils as needed for allergies or rhinitis.    [provider]  lisinopril (ZESTRIL) 5 MG tablet Take 1 tablet by mouth once daily 09/08/20   Vivi Barrack, MD  rivaroxaban (XARELTO) 20 MG TABS tablet Take 1 tablet (20 mg total) by mouth daily with supper. 04/12/20   Inda Coke,  PA  valACYclovir (VALTREX) 1000 MG tablet Take 1 tablet (1,000 mg total) by mouth daily. Patient taking differently: Take 1,000 mg by mouth daily. Pt cuts pill in half=500 mg daily 08/18/20   Vivi Barrack, MD    Allergies    Patient has no known allergies.  Review of Systems   Review of Systems  Gastrointestinal:  Positive for abdominal pain.  All other systems reviewed and are negative.  Physical Exam Updated Vital Signs BP (!) 146/101 (BP Location: Left Arm)   Pulse 96   Temp 97.8 F (36.6 C) (Oral)   Resp 20   SpO2 97%   Physical Exam Vitals and nursing note reviewed.  Constitutional:      General: He is not in acute distress.    Appearance: He is well-developed.  HENT:     Head: Atraumatic.  Eyes:     Conjunctiva/sclera: Conjunctivae normal.  Cardiovascular:     Rate and Rhythm: Normal rate and regular rhythm.     Heart sounds: Murmur heard.  Pulmonary:     Effort: Pulmonary effort is normal.     Breath sounds: Normal breath sounds. No wheezing.  Abdominal:     General: Abdomen is protuberant. Bowel sounds are decreased.     Palpations: Abdomen is soft.     Tenderness: There is abdominal tenderness (Mild suprapubic tenderness no guarding or rebound tenderness.  Abdomen is mildly distended).  Musculoskeletal:     Cervical back: Neck supple.  Skin:    Findings: No rash.  Neurological:     Mental Status: He is alert.    ED Results / Procedures / Treatments   Labs (all labs ordered are listed, but only abnormal results are displayed) Labs Reviewed  COMPREHENSIVE METABOLIC PANEL - Abnormal; Notable for the following components:      Result Value   CO2 21 (*)    Glucose, Bld 144 (*)    Total Bilirubin 1.8 (*)    All other components within normal limits  URINALYSIS, ROUTINE W REFLEX MICROSCOPIC - Abnormal; Notable for the following components:   Ketones, ur 20 (*)    Leukocytes,Ua TRACE (*)    Bacteria, UA RARE (*)    All other components within normal  limits  CBC WITH DIFFERENTIAL/PLATELET  LIPASE, BLOOD    EKG None  Radiology CT ABDOMEN PELVIS W CONTRAST  Result Date: 11/17/2020 CLINICAL DATA:  Abdominal pain, bowel obstruction suspected. Had EGD and colonoscopy this morning. Unable to pass flatus. EXAM: CT ABDOMEN AND PELVIS WITH CONTRAST TECHNIQUE: Multidetector CT imaging of the abdomen and pelvis was performed using the standard protocol following bolus administration of intravenous contrast. CONTRAST:  35m OMNIPAQUE IOHEXOL 350 MG/ML SOLN COMPARISON:  CT abdomen pelvis 04/13/2020 FINDINGS: Lower chest: Stable scarring at the posterior right lower lobe. Minimal dependent atelectasis. A 1.1 cm paraesophageal lymph node is unchanged compared to 03/23/2020 (series 2, image 1). Hepatobiliary: No focal liver abnormality is seen. No gallstones, gallbladder wall thickening, or biliary dilatation. Pancreas: Diffuse moderate parenchymal atrophy and moderate fatty infiltration. A 0.7 cm oval homogeneous hypodense lesion is noted at the tail of the pancreas (series 4, image 92. No dilatation of pancreatic duct or peripancreatic fat stranding. Spleen: Normal in size without focal abnormality. Adrenals/Urinary Tract: Cortical scarring at the upper third of the left kidney. A 0.5 cm round homogeneous hypodense cortical lesion is noted of the lower pole of the right kidney, too small to characterize but likely a benign cyst (series 2, image 50). The kidneys are otherwise unremarkable. Adrenal glands are unremarkable. Stomach/Bowel: Stomach is within normal limits. Appendix appears normal. No evidence of bowel wall thickening, distention, or inflammatory changes. Gaseous distension throughout the colon, consistent with history of earlier same day colonoscopy. Diverticulosis of the proximal sigmoid colon. Vascular/Lymphatic: Enlarged mesenteric lymph nodes in the small bowel mesentery in the right abdomen, with a probable conglomerate of lymph nodes measuring up  to 4.3 by 2.3 cm (series 2, image 40). There is a misty appearance of the surrounding mesenteric fat with tumoral pseudo capsule surrounding the mesenteric fat. Overall, the appearance is unchanged compared to 04/13/2020. No new lymphadenopathy in the abdomen or pelvis. Mild scattered calcific atherosclerosis of the abdominal aorta. No abdominal aortic aneurysm. Reproductive: Enlarged prostate with central calcifications and mass effect on the posteroinferior aspect of the urinary bladder. Other: No free intraperitoneal air. No ascites. Bilateral small fat containing inguinal hernias. Musculoskeletal: Posterior fusion hardware spans L3-S1. Unchanged grade 2 anterolisthesis of L5 on S1. Multilevel degenerative disc disease in the visualized distal thoracic and upper lumbar spine. IMPRESSION: 1. No acute abnormality in the abdomen or pelvis. Specifically, no findings of bowel perforation, obstruction, or inflammatory changes of the bowel. Gaseous distention of the colon, consistent with earlier same day colonoscopy. 2. 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 3. Incidentally noted 0.7 cm cystic lesion in the tail of the pancreas. Recommend follow-up cross-sectional imaging with pancreas protocol CT or MRI in 2 years to assess stability. Electronically Signed   By: LIleana RoupM.D.   On: 11/17/2020 14:18    Procedures Procedures   Medications Ordered in ED Medications - No data to display  ED Course  I have reviewed the triage vital signs and the nursing notes.  Pertinent labs & imaging results that were available during my care of the patient were reviewed by me and considered in my medical decision making (see chart for details).    MDM Rules/Calculators/A&P                           BP 126/88 (BP Location: Right Arm)   Pulse 92   Temp 97.8 F (36.6 C) (Oral)   Resp 18   SpO2 94%   Final Clinical Impression(s) / ED Diagnoses Final  diagnoses:  Status post colonoscopy    Rx / DC Orders ED Discharge Orders     None      11:14 AM Patient recently had endoscopy and colonoscopy performed this morning for routine screening who is now having some abdominal discomfort as well as difficulty passing flatus.  Plan to  obtain labs, screen CT scan and monitor.  I did reach out to GI specialist who made aware and agreed to plan.  2:42 PM Labs are reassuring.  CT scan of the abdomen pelvis without any acute abnormalities specifically no finding of bowel perforation, obstruction, or inflammatory changes of the bowel.  There is gaseous distention of the colon consistent with ileus same-day colonoscopy.  Incidentally there is a 0.7 cm cystic lesion in the tail of the pancreas.  I discussed this finding with patient and recommend outpatient follow-up for a CT or MRI pancreas protocol in 2 years to assess for stability.  Patient made aware of findings and also agrees with plan.  Patient also report able to pass flatus and feeling much better.  At this time he is stable for discharge.  Care discussed with Dr. Alvino Chapel.   Domenic Moras, PA-C 11/17/20 1448    Davonna Belling, MD 11/18/20 1447

## 2020-11-17 NOTE — Progress Notes (Signed)
Report given to PACU, vss 

## 2020-11-17 NOTE — Patient Instructions (Addendum)
Handouts provided on gastritis, high-fiber diet, polyps, diverticulosis and hemorrhoids.   No aspirin, ibuprofen, naproxen, or other non-steriodal anti-inflammatory drugs (NSAIDs). You may use Tylenol if needed for mild pain or fever.   Recommend a high-fiber diet (see handout).   Resume Xarelto on 11/18/20.  YOU HAD AN ENDOSCOPIC PROCEDURE TODAY AT [facility]:   Refer to the procedure report that was given to you for any specific questions about what was found during the examination.  If the procedure report does not answer your questions, please call your gastroenterologist to clarify.  If you requested that your care partner not be given the details of your procedure findings, then the procedure report has been included in a sealed envelope for you to review at your convenience later.  YOU SHOULD EXPECT: Some feelings of bloating in the abdomen. Passage of more gas than usual.  Walking can help get rid of the air that was put into your GI tract during the procedure and reduce the bloating. If you had a lower endoscopy (such as a colonoscopy or flexible sigmoidoscopy) you may notice spotting of blood in your stool or on the toilet paper. If you underwent a bowel prep for your procedure, you may not have a normal bowel movement for a few days.  Please Note:  You might notice some irritation and congestion in your nose or some drainage.  This is from the oxygen used during your procedure.  There is no need for concern and it should clear up in a day or so.  SYMPTOMS TO REPORT IMMEDIATELY:  Following lower endoscopy (colonoscopy or flexible sigmoidoscopy):  Excessive amounts of blood in the stool  Significant tenderness or worsening of abdominal pains  Swelling of the abdomen that is new, acute  Fever of 100F or higher  Following upper endoscopy (EGD)  Vomiting of blood or coffee ground material  New chest pain or pain under the shoulder blades  Painful or persistently  difficult swallowing  New shortness of breath  Fever of 100F or higher  Black, tarry-looking stools  For urgent or emergent issues, a gastroenterologist can be reached at any hour by calling [phone]. Do not use MyChart messaging for urgent concerns.    DIET:  We do recommend a small meal at first, but then you may proceed to your regular diet.  Drink plenty of fluids but you should avoid alcoholic beverages for 24 hours.  ACTIVITY:  You should plan to take it easy for the rest of today and you should NOT DRIVE or use heavy machinery until tomorrow (because of the sedation medicines used during the test).    FOLLOW UP: Our staff will call the number listed on your records 48-72 hours following your procedure to check on you and address any questions or concerns that you may have regarding the information given to you following your procedure. If we do not reach you, we will leave a message.  We will attempt to reach you two times.  During this call, we will ask if you have developed any symptoms of COVID 19. If you develop any symptoms (ie: fever, flu-like symptoms, shortness of breath, cough etc.) before then, please call [phone].  If you test positive for Covid 19 in the 2 weeks post procedure, please call and report this information to [place].    If any biopsies were taken you will be contacted by phone or by letter within the next 1-3 weeks.  Please call us at [phone]  [phone] if you have not heard about the biopsies in 3 weeks.    SIGNATURES/CONFIDENTIALITY: You and/or your care partner have signed paperwork which will be entered into your electronic medical record.  These signatures attest to the fact that that the information above on your After Visit Summary has been reviewed and is understood.  Full responsibility of the confidentiality of this discharge information lies with you and/or your care-partner.

## 2020-11-17 NOTE — Progress Notes (Signed)
Vital signs checked by:CW ? ?The medical and surgical history was reviewed and verified with the patient. ? ?

## 2020-11-17 NOTE — Telephone Encounter (Signed)
I called Taylor Berger at [phone] to check on him after his discharge from the hospital at 5:46 and 7:29. Call went to voicemail each time. I left a brief message.

## 2020-11-17 NOTE — Progress Notes (Signed)
Pt having difficulty passing air after colonosocpy. Have repositioned several times from left to right side. Ambulated, sat on commode. Gave Levsin 0.25 mg SL at 0933. Pt only able to pass a small amount of air per rectum. He has passed more air with belching. Pt had an episode of nausea and vomited a very small amount of brown fluid. Dr. Tarri Glenn assessed pt. Attempted hands and knees position and ambulating and sitting on commode again.   Pt given 0.68m of simethicone at 0934. Gave hot packs for abdomen. Pt still sitting on commode and is still with abdominal distention and discomfort. Dr. BTarri Glennis aware.   1013am-pt only able to pass a small amount of air after the simethicone and sitting on commode. Pt had another episode of nausea and heaving with a small amount of brown liquid (maybe about 20-30cc).   1030-pt able to pass a little more air but is still having abdominal distention and discomfort. Dr. BTarri Glennspoke with pt and his wife and recommended that the pt go to the ER to be evaluated to be sure that something is not being missed as this was not a normal response to the colonoscopy performed today. Pt and his wife agreeable to plan of care. [facility].

## 2020-11-17 NOTE — Op Note (Signed)
[facility] Patient Name: Taylor Berger Procedure Date: 11/17/2020 7:25 AM MRN: [id number] Endoscopist: Thornton Park MD, MD Age: 75 Referring MD:  Date of Birth: 1946/02/07 Gender: Male Account #: [id number] Procedure:                Colonoscopy Indications:              Surveillance: Personal history of adenomatous                            polyps on last colonoscopy 5 years ago                           -Tubular adenoma on colonoscopy in [place] 2016                           Xarelto for DVT in 2020 and 2021                           Incidental history: Recent clinically diagnosed                            diverticulitis 12/21 in the setting of known                            diverticulosis                           -No abdominal imaging performed at that time                           -Treated empirically with Cipro and Flagyl Medicines:                Monitored Anesthesia Care Procedure:                Pre-Anesthesia Assessment:                           - Prior to the procedure, a History and Physical                            was performed, and patient medications and                            allergies were reviewed. The patient's tolerance of                            previous anesthesia was also reviewed. The risks                            and benefits of the procedure and the sedation                            options and risks were discussed with the patient.                            All questions were  answered, and informed consent                            was obtained. Prior Anticoagulants: The patient has                            taken Xarelto (rivaroxaban), last dose was 2 days                            prior to procedure. ASA Grade Assessment: III - A                            patient with severe systemic disease. After                            reviewing the risks and benefits, the patient was                            deemed in  satisfactory condition to undergo the                            procedure.                           After obtaining informed consent, the colonoscope                            was passed under direct vision. Throughout the                            procedure, the patient's blood pressure, pulse, and                            oxygen saturations were monitored continuously. The                            CF [id number] was introduced through the anus                            and advanced to the 4 cm into the ileum. A second                            forward view of the right colon was performed. The                            colonoscopy was performed without difficulty. The                            patient tolerated the procedure well. The quality                            of the bowel preparation was good. The terminal  ileum, ileocecal valve, appendiceal orifice, and                            rectum were photographed. Scope In: 7:55:45 AM Scope Out: 8:13:17 AM Scope Withdrawal Time: 0 hours 12 minutes 36 seconds  Total Procedure Duration: 0 hours 17 minutes 32 seconds  Findings:                 Hemorrhoids were found on perianal exam.                           Non-bleeding external and internal hemorrhoids were                            found.                           Multiple small and large-mouthed diverticula were                            found in the sigmoid colon and descending colon.                            There was no diverticulitis.                           A 3 mm polyp was found in the descending colon. The                            polyp was sessile. The polyp was removed with a                            cold snare. Resection and retrieval were complete.                            Estimated blood loss was minimal.                           Two sessile polyps were found in the ascending                            colon. The  polyps were 2 to 3 mm in size. These                            polyps were removed with a cold snare. Resection                            and retrieval were complete. Estimated blood loss                            was minimal.                           The exam was otherwise without abnormality on  direct and retroflexion views. Complications:            No immediate complications. Estimated blood loss:                            Minimal. Estimated Blood Loss:     Estimated blood loss was minimal. Impression:               - Hemorrhoids found on perianal exam.                           - Non-bleeding external and internal hemorrhoids.                           - Diverticulosis in the sigmoid colon and in the                            descending colon.                           - One 3 mm polyp in the descending colon, removed                            with a cold snare. Resected and retrieved.                           - Two 2 to 3 mm polyps in the ascending colon,                            removed with a cold snare. Resected and retrieved.                           - The examination was otherwise normal on direct                            and retroflexion views. Recommendation:           - Patient has a contact number available for                            emergencies. The signs and symptoms of potential                            delayed complications were discussed with the                            patient. Return to normal activities tomorrow.                            Written discharge instructions were provided to the                            patient.                           - High fiber diet.                           -  Continue present medications.                           - Resume Xarelto 11/18/20.                           - Await pathology results.                           - Repeat colonoscopy in 3 years for surveillance if                             all 3 polyps are adenomas.                           - Emerging evidence supports eating a diet of                            fruits, vegetables, grains, calcium, and yogurt                            while reducing red meat and alcohol may reduce the                            risk of colon cancer.                           - Thank you for allowing me to be involved in your                            colon cancer prevention. Thornton Park MD, MD 11/17/2020 8:27:32 AM This report has been signed electronically.

## 2020-11-17 NOTE — ED Triage Notes (Signed)
Had a colonoscopy and EGD this AM at 0730, after he woke up started having abdominal pain and pressure after the procedure. Denies emesis, endorses nausea. No BM or urine since. States he passed some gas, but 'not enough.'

## 2020-11-17 NOTE — Progress Notes (Signed)
Please see note from 10/27/20. No change in history or PE.  Appropriate candidate for endoscopy this morning in the [facility] for:  EGD for intermittent dysphagia and a history of Barrett's Esophagus and Colonoscopy for history of colon polyps, last removed 2016 with surveillance recommended in 5 years.  His Xarelto has been on hold.  Consent confirmed.

## 2020-11-17 NOTE — Progress Notes (Signed)
[id number] Ephedrine 10 mg given IV due to low BP, MD updated.

## 2020-11-17 NOTE — Progress Notes (Signed)
Patient sent to [facility]. Discussed with Sydnee Cabal, charge nurse at the [facility] to prepare for his arrival. Our inpatient team alerted in the event that their input is needed.

## 2020-11-18 NOTE — Telephone Encounter (Signed)
Good news. Thanks for the follow-up.

## 2020-11-18 NOTE — Telephone Encounter (Signed)
Spoke with Taylor Berger. Pt stated that he is feeling OK. Pt stated that he is passing gas and the cramps are going Away.

## 2020-11-21 ENCOUNTER — Telehealth: Payer: Self-pay | Admitting: *Deleted

## 2020-11-21 NOTE — Telephone Encounter (Signed)
Follow up call made. 

## 2020-11-21 NOTE — Telephone Encounter (Signed)
  Follow up Call-  Call back number 11/17/2020  Post procedure Call Back phone  # [phone]  Permission to leave phone message Yes  Some recent data might be hidden     Patient questions:  Do you have a fever, pain , or abdominal swelling? No. Pain Score  0 *  Have you tolerated food without any problems? Yes.    Have you been able to return to your normal activities? Yes.    Do you have any questions about your discharge instructions: Diet   No. Medications  No. Follow up visit  No.  Do you have questions or concerns about your Care? No.  Actions: * If pain score is 4 or above: No action needed, pain <4.  Have you developed a fever since your procedure? no  2.   Have you had an respiratory symptoms (SOB or cough) since your procedure? no  3.   Have you tested positive for COVID 19 since your procedure no  4.   Have you had any family members/close contacts diagnosed with the COVID 19 since your procedure?  no   If yes to any of these questions please route to Joylene John, RN and Joella Prince, RN

## 2020-11-21 NOTE — Telephone Encounter (Signed)
Received call from pt. He states he hd a CT scan after going to the ED after he had a colonoscopy. (See ED note) Per patient, he has a new finding on the CT scan related to his pancreas. He would like Dr. Lorenso Courier to review scan and let him know if he needs this pancreas issue addressed.  Please advise

## 2020-11-22 ENCOUNTER — Other Ambulatory Visit: Payer: Self-pay

## 2020-11-22 MED ORDER — ESOMEPRAZOLE MAGNESIUM 40 MG PO CPDR
40.0000 mg | DELAYED_RELEASE_CAPSULE | Freq: Two times a day (BID) | ORAL | 3 refills | Status: DC
Start: 2020-11-22 — End: 2021-02-20

## 2020-11-23 ENCOUNTER — Ambulatory Visit (INDEPENDENT_AMBULATORY_CARE_PROVIDER_SITE_OTHER): Payer: Medicare Other | Admitting: Physician Assistant

## 2020-11-23 ENCOUNTER — Encounter: Payer: Self-pay | Admitting: Physician Assistant

## 2020-11-23 ENCOUNTER — Other Ambulatory Visit: Payer: Self-pay

## 2020-11-23 VITALS — BP 120/80 | HR 60 | Temp 97.4°F | Wt 204.8 lb

## 2020-11-23 DIAGNOSIS — K869 Disease of pancreas, unspecified: Secondary | ICD-10-CM

## 2020-11-23 DIAGNOSIS — R29898 Other symptoms and signs involving the musculoskeletal system: Secondary | ICD-10-CM

## 2020-11-23 DIAGNOSIS — R7309 Other abnormal glucose: Secondary | ICD-10-CM | POA: Diagnosis not present

## 2020-11-23 DIAGNOSIS — E785 Hyperlipidemia, unspecified: Secondary | ICD-10-CM

## 2020-11-23 DIAGNOSIS — M542 Cervicalgia: Secondary | ICD-10-CM

## 2020-11-23 LAB — HEPATIC FUNCTION PANEL
ALT: 24 U/L (ref 0–53)
AST: 20 U/L (ref 0–37)
Albumin: 4.2 g/dL (ref 3.5–5.2)
Alkaline Phosphatase: 65 U/L (ref 39–117)
Bilirubin, Direct: 0.1 mg/dL (ref 0.0–0.3)
Total Bilirubin: 0.9 mg/dL (ref 0.2–1.2)
Total Protein: 7 g/dL (ref 6.0–8.3)

## 2020-11-23 LAB — LIPID PANEL
Cholesterol: 155 mg/dL (ref 0–200)
HDL: 40.2 mg/dL (ref >39.00)
LDL Cholesterol: 87 mg/dL (ref 0–99)
NonHDL: 114.31
Total CHOL/HDL Ratio: 4
Triglycerides: 138 mg/dL (ref 0.0–149.0)
VLDL: 27.6 mg/dL (ref 0.0–40.0)

## 2020-11-23 LAB — HEMOGLOBIN A1C: Hgb A1c MFr Bld: 6 % (ref 4.6–6.5)

## 2020-11-23 MED ORDER — VALACYCLOVIR HCL 500 MG PO TABS: 500.0000 mg | ORAL_TABLET | Freq: Every day | ORAL | 3 refills | Status: DC

## 2020-11-23 NOTE — Progress Notes (Signed)
Taylor Berger is a 75 y.o. male here for a new problem.  History of Present Illness:   Chief Complaint  Patient presents with  . Neck Pain    Right side, started about 3 months ago  . Hand Pain    Bilateral hand pain, feels like he is loosing strength   . GI Problem    Taylor Berger reported that he has swelling in stomach after upper and lower GI, suggested to increase Nexium 40 mg Bid x 3 weeks  Was unable to pass gas after colonoscopy so he was sent to ER, after CT scan - cyst on his pancreas was discovered    HPI  Stiff neck Going on for 2-3 months. Hx of cervical fusion. Not getting worse with time. Pain is a 7/10. Trying to do stretches at home that he learned from prior PT visits. Takes tylenol for this. Denies fever, chills, severe HA.  Weakness in bilateral hands Pt c/o no strength in hands, with difficulty gripping. Has hx of carpal tunnel. This is a chronic issue and is getting worse for him. Hands are starting to lock up and he has to manually force his hands open at times. Did have carpal tunnel release in L hand several years ago.  S/p colonoscopy and endoscopy Was taking nexium 20 mg daily and increased to nexium 40 mg BID based on recommendations from GI. Found gastritis on his endoscopy and had a dilitation done.  Was sent to ER after procedure because he was unable to pass gass. CT scan showed Incidentally noted 0.7 cm cystic lesion in the tail of the pancreas -- he called   Elevated glucose History of slightly elevated A1c in the past. Has not had this rechecked in awhile. Denies polyuria and polyphagia.  Wt Readings from Last 4 Encounters:  11/23/20 204 lb 12.8 oz (92.9 kg)  11/17/20 204 lb (92.5 kg)  11/10/20 209 lb 3.2 oz (94.9 kg)  10/27/20 204 lb 12.8 oz (92.9 kg)   Hyperbilirubinemia At recent ER visit, he had incidental lab finding of elevated Tbili. Needs recheck today.  Lab Results  Component Value Date   BILITOT 1.8 (H) 11/17/2020   HLD Currently  on lipitor 40 mg daily. Needs up dated blood work. Denies concerns with med or myalgias.   Past Medical History:  Diagnosis Date  . Allergy    Seasonal  . Aortic aneurysm ([place])   . Depression   . DVT (deep venous thrombosis) ([place]) 2020  . GERD (gastroesophageal reflux disease)   . Goiter   . Hyperlipidemia   . Hypertension   . Lymphoma ([place]) [id number]   Follicular, stomach  . Neuropathy of both feet      Social History   Tobacco Use  . Smoking status: Never  . Smokeless tobacco: Never  Vaping Use  . Vaping Use: Never used  Substance Use Topics  . Alcohol use: Not Currently  . Drug use: Never    Past Surgical History:  Procedure Laterality Date  . CARPAL TUNNEL RELEASE Left   . CERVICAL DISCECTOMY  1995   C-7  . CERVICAL FUSION  01/1989   C-3 through C-6 / no metal  . COLONOSCOPY    . ELBOW SURGERY Left   . FOOT SURGERY Left 1999   Plantar fascitis  . low back fusion  2013   Lumbar & Low Thoracic with metal  . SHOULDER ARTHROSCOPY WITH ROTATOR CUFF REPAIR Right 2014  . UPPER GASTROINTESTINAL ENDOSCOPY  Family History  Problem Relation Age of Onset  . Diabetes Mother   . Hypertension Mother   . Hyperlipidemia Mother   . Heart failure Mother   . Leukemia Father   . Stroke Father   . Esophageal cancer Neg Hx   . Colon polyps Neg Hx   . Pancreatic cancer Neg Hx   . Stomach cancer Neg Hx   . Rectal cancer Neg Hx     No Known Allergies  Current Medications:   Current Outpatient Medications:  .  amLODipine (NORVASC) 10 MG tablet, Take 1 tablet by mouth once daily, Disp: 90 tablet, Rfl: 0 .  atorvastatin (LIPITOR) 40 MG tablet, Take 1 tablet by mouth once daily, Disp: 90 tablet, Rfl: 0 .  cetirizine (ZYRTEC) 10 MG tablet, Take 10 mg by mouth daily., Disp: , Rfl:  .  clindamycin (CLEOCIN-T) 1 % external solution, Apply topically 2 (two) times daily. (Patient taking differently: Apply topically as needed.), Disp: 30 mL, Rfl: 0 .  esomeprazole (NEXIUM) 40 MG  capsule, Take 1 capsule (40 mg total) by mouth 2 (two) times daily before a meal., Disp: 60 capsule, Rfl: 3 .  fluticasone (FLONASE) 50 MCG/ACT nasal spray, Place into both nostrils as needed for allergies or rhinitis., Disp: , Rfl:  .  lisinopril (ZESTRIL) 5 MG tablet, Take 1 tablet by mouth once daily, Disp: 90 tablet, Rfl: 0 .  rivaroxaban (XARELTO) 20 MG TABS tablet, Take 1 tablet (20 mg total) by mouth daily with supper., Disp: 90 tablet, Rfl: 1 .  valACYclovir (VALTREX) 500 MG tablet, Take 1 tablet (500 mg total) by mouth daily., Disp: 90 tablet, Rfl: 3   Review of Systems:   ROS Negative unless otherwise specified per HPI.=  Vitals:   Vitals:   11/23/20 0859  BP: 120/80  Pulse: 60  Temp: (!) 97.4 F (36.3 C)  TempSrc: Temporal  SpO2: 95%  Weight: 204 lb 12.8 oz (92.9 kg)     Body mass index is 31.14 kg/m.  Physical Exam:   Physical Exam Vitals and nursing note reviewed.  Constitutional:      General: He is not in acute distress.    Appearance: He is well-developed. He is not ill-appearing or toxic-appearing.  Cardiovascular:     Rate and Rhythm: Normal rate and regular rhythm.     Pulses: Normal pulses.     Heart sounds: Normal heart sounds, S1 normal and S2 normal.     Comments: No LE edema Pulmonary:     Effort: Pulmonary effort is normal.     Breath sounds: Normal breath sounds.  Musculoskeletal:     Comments: No decreased ROM 2/2 pain with flexion/extension, lateral side bends, or rotation. Slight reproducible tenderness with deep palpation to right cervical paraspinal muscles. No bony tenderness. No evidence of erythema, rash or ecchymosis.      Skin:    General: Skin is warm and dry.  Neurological:     Mental Status: He is alert.     GCS: GCS eye subscore is 4. GCS verbal subscore is 5. GCS motor subscore is 6.     Comments: Grip strength reduced L>R Normal perceived sensation to bilateral fingers  Psychiatric:        Speech: Speech normal.         Behavior: Behavior normal. Behavior is cooperative.    Assessment and Plan:   Taylor Berger was seen today for neck pain, hand pain and gi problem.  Diagnoses and all orders for this visit:  Decreased grip strength Possible cervical radiculopathy and/or carpal tunnel Referral to sports medicine -     Ambulatory referral to Sports Medicine  Elevated glucose Update A1c and provide recommendations accordingly -     Hemoglobin A1c  Cervical pain Given chronicity and worsening, will obtain xray Consider PT vs sports med vs other based on results Recommend topical voltaren prn to see if this can provide relief. -     DG Cervical Spine Complete; Future  Hyperbilirubinemia Repeat labs If abnormal, will reach out to GI on behalf of patient -     Hepatic function panel  Hyperlipidemia, unspecified hyperlipidemia type Update blood work and adjust Lipitor 40 mg as needed -     Lipid panel  Lesion of pancreas Briefly discussed today Patient has a messaging with his oncologist, Dr. Lorenso Courier, to discuss further Recommend he can continue to follow-up with him regarding this.  Other orders -     valACYclovir (VALTREX) 500 MG tablet; Take 1 tablet (500 mg total) by mouth daily.    Inda Coke, PA-C

## 2020-11-23 NOTE — Patient Instructions (Signed)
It was great to see you!  An order for an xray has been put in for you. To get your xray, you can walk in at the [facility] location without a scheduled appointment.  The address is [address] It is across the street from [facility] is located in the basement.  Hours of operation are M-F 8:30am to 5:00pm. Please note that they are closed for lunch between 12:30 and 1:00pm.  Trial topical diclofenac for your neck A referral has been placed for you to see Dr. Lynne Leader with [facility]. Someone from there office will be in touch soon regarding your appointment with him. His location: [place] at [facility] [address] on the 1st floor.   Phone number [phone], Fax [phone].  This location is across the street from the entrance to [employer] and in the same complex as the [facility] and [facility]  Blood work today  Follow-up with Lorenso Courier about your pancreatic cyst  Follow-up with [facility] about your nexium increase  Take care,  [employer] PA-C

## 2020-11-24 ENCOUNTER — Encounter: Payer: Self-pay | Admitting: Hematology and Oncology

## 2020-11-28 ENCOUNTER — Telehealth: Payer: Self-pay | Admitting: *Deleted

## 2020-11-28 NOTE — Telephone Encounter (Signed)
Spoke with pt's wife as Taylor Berger was not home. Pt had called to make sure the CT scan he had last week was sufficient for Taylor Berger purposes and to confirm his appt on 12/07/20. Informed wife that the recent CT scan was what Taylor Berger needed and will discuss the results of that with him @ his nexct appt.  Confirmed that Taylor Berger' appt is on 12/07/20 @ 10 am.  Taylor Berger stated she will pass the information on to her husband.

## 2020-11-28 NOTE — Progress Notes (Signed)
Subjective:    CC: B hand pain and decreased grip strength  I, Taylor Berger, LAT, ATC, am serving as scribe for Dr. Lynne Berger.  HPI: Pt is a 75 y/o male presenting w/ c/o B hand pain and decreased grip strength, L>R, x many years w/ worsening symptoms over the last 3-4 years.  He has a hx of a cervical fusion (C3-7) in 2010 and of carpal tunnel syndrome.  Also has a hx of L carapl tunnel release and a L ulnar nerve transposition about 5-6 years ago.  Last neck surgery [place] 2009 Dr Carollee Massed  Left Carpal Tunnel release and Ulnar nerve transposition Dr Dema Severin in [place] about 6 years ago.   Neck pain: yes UE numbness/tingling: No UE weakness: yes, decreased grip strength Aggravating factors: None specifically noted Treatments tried: wrist braces prior to and after his CTS and ulnar nerve transposition; compression gloves at night;  Pertinent review of Systems: no fever or chills  Relevant historical information: hx multiple prior ortho surgeries as above. Also current follicular lymphoma currently under observation without current treatment.    Objective:    Vitals:   11/29/20 0943  BP: 120/80  Pulse: (!) 50  SpO2: 94%   General: Well Developed, well nourished, and in no acute distress.   MSK: Left hand atrophy thenar musculature and intrinsic hand musculature. Normal hand motion. Decreased strength of thumb opposition and grip strength. Decree strength to finger abduction. Pulses cap refill and sensation are intact distally. Cervical motion limited but negative Spurling's test.  Next  Lab and Radiology Results  X-ray images C-spine obtained today personally and independently interpreted. At the level cervical fusion from C3-7 with anterior surgical hardware in place appearing. No acute fractures are visible. Await formal radiology review    Impression and Recommendations:    Assessment and Plan: 75 y.o. male with left hand weakness and atrophy.   History of multiple cervical fusions with most recent one looking to be a large multilevel fusion.  Additionally history of carpal tunnel release and ulnar nerve transposition.  Most likely explanation for current symptoms are significant issue with median nerve.  However fundamentally he needs a nerve conduction study to further evaluate cause of weakness atrophy and symptoms.  Plan for nerve conduction study now.  Additionally refer to hand therapy as this may be helpful.  Recheck after nerve conduction study completed.Marland Kitchen  PDMP not reviewed this encounter. Orders Placed This Encounter  Procedures   DG Cervical Spine Complete    Standing Status:   Future    Number of Occurrences:   1    Standing Expiration Date:   11/29/2021    Order Specific Question:   Reason for Exam (SYMPTOM  OR DIAGNOSIS REQUIRED)    Answer:   eval cspine    Order Specific Question:   Preferred imaging location?    Answer:   [facility]   Ambulatory referral to Physical Therapy    Referral Priority:   Routine    Referral Type:   Physical Medicine    Referral Reason:   Specialty Services Required    Requested Specialty:   Physical Therapy    Number of Visits Requested:   1   Ambulatory referral to Neurology    Referral Priority:   Routine    Referral Type:   Consultation    Referral Reason:   Specialty Services Required    Requested Specialty:   Neurology    Number of Visits Requested:  1   NCV with EMG(electromyography)    Standing Status:   Future    Standing Expiration Date:   11/29/2021    Order Specific Question:   Where should this test be performed?    Answer:   [facility]   No orders of the defined types were placed in this encounter.   Discussed warning signs or symptoms. Please see discharge instructions. Patient expresses understanding.   The above documentation has been reviewed and is accurate and complete Taylor Berger, M.D.

## 2020-11-28 NOTE — Telephone Encounter (Signed)
TCT patient regarding his recent CT scan

## 2020-11-29 ENCOUNTER — Other Ambulatory Visit: Payer: Self-pay

## 2020-11-29 ENCOUNTER — Ambulatory Visit (INDEPENDENT_AMBULATORY_CARE_PROVIDER_SITE_OTHER): Payer: Medicare Other | Admitting: Family Medicine

## 2020-11-29 ENCOUNTER — Ambulatory Visit (INDEPENDENT_AMBULATORY_CARE_PROVIDER_SITE_OTHER): Payer: Medicare Other

## 2020-11-29 ENCOUNTER — Ambulatory Visit (INDEPENDENT_AMBULATORY_CARE_PROVIDER_SITE_OTHER): Payer: Medicare Other | Admitting: Podiatry

## 2020-11-29 ENCOUNTER — Encounter: Payer: Self-pay | Admitting: Family Medicine

## 2020-11-29 VITALS — BP 120/80 | HR 50 | Ht 68.0 in | Wt 209.6 lb

## 2020-11-29 DIAGNOSIS — G5622 Lesion of ulnar nerve, left upper limb: Secondary | ICD-10-CM

## 2020-11-29 DIAGNOSIS — I2510 Atherosclerotic heart disease of native coronary artery without angina pectoris: Secondary | ICD-10-CM

## 2020-11-29 DIAGNOSIS — M109 Gout, unspecified: Secondary | ICD-10-CM

## 2020-11-29 DIAGNOSIS — M722 Plantar fascial fibromatosis: Secondary | ICD-10-CM | POA: Diagnosis not present

## 2020-11-29 DIAGNOSIS — R29898 Other symptoms and signs involving the musculoskeletal system: Secondary | ICD-10-CM | POA: Diagnosis not present

## 2020-11-29 DIAGNOSIS — G5602 Carpal tunnel syndrome, left upper limb: Secondary | ICD-10-CM

## 2020-11-29 DIAGNOSIS — M76822 Posterior tibial tendinitis, left leg: Secondary | ICD-10-CM | POA: Diagnosis not present

## 2020-11-29 NOTE — Patient Instructions (Signed)
Posterior Tibial Tendinitis  Posterior tibial tendinitis is irritation of a tendon called the posterior tibial tendon. Your posterior tibial tendon is a cord-like tissue that connects bones of your lower leg and foot to a muscle that: Supports your arch. Helps you raise up on your toes. Helps you turn your foot down and in. This condition causes foot and ankle pain. It can also lead to a flat foot. What are the causes? This condition is most often caused by repeated stress to the tendon (overuse injury). It can also be caused by a sudden injury that stresses the tendon, such as landing on your foot after jumping or falling. What increases the risk? This condition is more likely to develop in: People who play a sport that involves putting a lot of pressure on the feet, such as: Basketball. Tennis. Soccer. Hockey. Runners. Females who are older than 77 years of age and are overweight. People with diabetes. People with decreased foot stability. People with flat feet. What are the signs or symptoms? Symptoms include: Pain in the inner ankle. Pain at the arch of your foot. Pain that gets worse with running, walking, or standing. Swelling on the inside of your ankle and foot. Weakness in your ankle or foot. Inability to stand up on tiptoe. Flattening of the arch of your foot. How is this diagnosed? This condition may be diagnosed based on: Your symptoms. Your medical history. A physical exam. Tests, such as: X-ray. MRI. Ultrasound. How is this treated? This condition may be treated by: Putting ice to the injured area. Taking NSAIDs, such as ibuprofen, to reduce pain and swelling. Wearing a special shoe or shoe insert to support your arch (orthotic). Having physical therapy. Replacing high-impact exercise with low-impact exercise, such as swimming or cycling. If your symptoms do not improve with these treatments, you may need to wear a splint, removable walking boot, or short  leg cast for 6-8 weeks to keep your foot and ankle still (immobilized). Follow these instructions at home: If you have a cast, splint, or boot: Keep it clean and dry. Check the skin around it every day. Tell your health care provider about any concerns. If you have a cast: Do not stick anything inside it to scratch your skin. Doing that increases your risk of infection. You may put lotion on dry skin around the edges of the cast. Do not put lotion on the skin underneath the cast. If you have a splint or boot: Wear it as told by your health care provider. Remove it only as told by your health care provider. Loosen it if your toes tingle, become numb, or turn cold and blue. Bathing Do not take baths, swim, or use a hot tub until your health care provider approves. Ask your health care provider if you may take showers. If your cast, splint, or boot is not waterproof: Do not let it get wet. Cover it with a waterproof covering while you take a bath or a shower. Managing pain and swelling   If directed, put ice on the injured area. If you have a removable splint or boot, remove it as told by your health care provider. Put ice in a plastic bag. Place a towel between your skin and the bag or between your cast and the bag. Leave the ice on for 20 minutes, 2-3 times a day. Move your toes often to reduce stiffness and swelling. Raise (elevate) the injured area above the level of your heart while you are sitting  or lying down. Activity Do not use the injured foot to support your body weight until your health care provider says that you can. Use crutches as told by your health care provider. Do not do activities that make pain or swelling worse. Ask your health care provider when it is safe to drive if you have a cast, splint, or boot on your foot. Return to your normal activities as told by your health care provider. Ask your health care provider what activities are safe for you. Do exercises as  told by your health care provider. General instructions Take over-the-counter and prescription medicines only as told by your health care provider. If you have an orthotic, use it as told by your health care provider. Keep all follow-up visits as told by your health care provider. This is important. How is this prevented? Wear footwear that is appropriate to your athletic activity. Avoid athletic activities that cause pain or swelling in your ankle or foot. Before being active, do range-of-motion and stretching exercises. If you develop pain or swelling while training, stop training. If you have pain or swelling that does not improve after a few days of rest, see your health care provider. If you start a new athletic activity, start gradually so you can build up your strength and flexibility. Contact a health care provider if: Your symptoms get worse. Your symptoms do not improve in 6-8 weeks. You develop new, unexplained symptoms. Your splint, boot, or cast gets damaged. Summary Posterior tibial tendinitis is irritation of a tendon called the posterior tibial tendon. This condition is most often caused by repeated stress to the tendon (overuse injury). This condition causes foot pain and ankle pain. It can also lead to a flat foot. This condition may be treated by not doing high-impact activities, applying ice, having physical therapy, wearing orthotics, and wearing a cast, splint, or boot if needed. This information is not intended to replace advice given to you by your health care provider. Make sure you discuss any questions you have with your health care provider. Document Revised: 07/15/2018 Document Reviewed: 05/22/2018 [employer] Patient Education  [place].  Posterior Tibial Tendinitis Rehab Ask your health care provider which exercises are safe for you. Do exercises exactly as told by your health care provider and adjust them as directed. It is normal to feel mild  stretching, pulling, tightness, or discomfort as you do these exercises. Stop right away if you feel sudden pain or your pain gets worse. Do not begin these exercises until told by your health care provider. Stretching and range-of-motion exercises These exercises warm up your muscles and joints and improve the movement and flexibility in your ankle and foot. These exercises may also help to relieve pain. Standing wall calf stretch, knee straight   Stand with your hands against a wall. Extend your left / right leg behind you, and bend your front knee slightly. If directed, place a folded washcloth under the arch of your foot for support. Point the toes of your back foot slightly inward. Keeping your heels on the floor and your back knee straight, shift your weight toward the wall. Do not allow your back to arch. You should feel a gentle stretch in your upper left / right calf. Hold this position for 10 seconds. Repeat 10 times. Complete this exercise 2 times a day. Standing wall calf stretch, knee bent Stand with your hands against a wall. Extend your left / right leg behind you, and bend your front  knee slightly. If directed, place a folded washcloth under the arch of your foot for support. Point the toes of your back foot slightly inward. Unlock your back knee so it is bent. Keep your heels on the floor. You should feel a gentle stretch deep in your lower left / right calf. Hold this position for 10 seconds. Repeat 10 times. Complete this exercise 2 times a day. Strengthening exercises These exercises build strength and endurance in your ankle and foot. Endurance is the ability to use your muscles for a long time, even after they get tired. Ankle inversion with band Secure one end of a rubber exercise band or tubing to a fixed object, such as a table leg or a pole, that will stay still when the band is pulled. Loop the other end of the band around the middle of your left / right foot. Sit  on the floor facing the object with your left / right leg extended. The band or tube should be slightly tense when your foot is relaxed. Leading with your big toe, slowly bring your left / right foot and ankle inward, toward your other foot (inversion). Hold this position for 10 seconds. Slowly return your foot to the starting position. Repeat 10 times. Complete this exercise 2 times a day. Towel curls   Sit in a chair on a non-carpeted surface, and put your feet on the floor. Place a towel in front of your feet. Keeping your heel on the floor, put your left / right foot on the towel. Pull the towel toward you by grabbing the towel with your toes and curling them under. Keep your heel on the floor while you do this. Let your toes relax. Grab the towel with your toes again. Keep going until the towel is completely underneath your foot. Repeat 10 times. Complete this exercise 2 times a day. Balance exercise This exercise improves or maintains your balance. Balance is important in preventing falls. Single leg stand Without wearing shoes, stand near a railing or in a doorway. You may hold on to the railing or door frame as needed for balance. Stand on your left / right foot. Keep your big toe down on the floor and try to keep your arch lifted. If balancing in this position is too easy, try the exercise with your eyes closed or while standing on a pillow. Hold this position for 10 seconds. Repeat 10 times. Complete this exercise 2 times a day. This information is not intended to replace advice given to you by your health care provider. Make sure you discuss any questions you have with your health care provider.  

## 2020-11-29 NOTE — Patient Instructions (Addendum)
Thank you for coming in today.   Plan for nerve conduction study and hand PT.   Recheck after the nerve study.

## 2020-11-30 NOTE — Progress Notes (Addendum)
  Subjective:  Patient ID: Taylor Berger, male    DOB: September 23, 1945,  MRN: [id number]  Chief Complaint  Patient presents with   Foot Swelling    Left greater than right and some pain    75 y.o. male presents with the above complaint. History confirmed with patient.  His right foot still has some tenderness but he has been working on with his therapy overall is manageable.  He has new pain in the inside of the left ankle injury that caused this.  10 [place] swelling and redness in this area 2.  As far as he knows he does not have any history of gout.  Objective:  Physical Exam: warm, good capillary refill, no trophic changes or ulcerative lesions, normal DP and PT pulses and normal sensory exam. Right Foot: No pain on palpation to plantar and posterior heel and along Achilles tendon today Left foot he has pain on palpation of the posterior tibial tendon along the medial ankle with +2 pitting edema throughout both lower extremities but is little worse on the left ankle and has some erythema here as well.  Radiographs: X-ray of the right foot: no fracture, dislocation, swelling or degenerative changes noted, plantar calcaneal spur and posterior calcaneal spur Assessment:   1. Posterior tibial tendinitis of left lower extremity   2. Acute gout of left ankle, unspecified cause      Plan:  Patient was evaluated and treated and all questions answered.  Suspect this is posterior tibial tendinitis I discussed etiology treatment options of this including therapeutic exercise and bracing.  I dispensed a Tri-Lock ankle brace for this side and a plantar fascial brace on the right side to continue to support the right side so he does not overload this as it heals.  I did recommend some lab work to evaluate this as well to ensure that it is not gout and he will have this done at his PCP visit next time he goes.  Exercises were given to him.  Return to see me as needed.  If not improving would consider  formal physical therapy and possible anti-inflammatory treatment with a methylprednisolone taper. He was also interested in a night splint and this was dispensed  Return if symptoms worsen or fail to improve.

## 2020-12-01 ENCOUNTER — Telehealth: Payer: Self-pay | Admitting: *Deleted

## 2020-12-01 NOTE — Telephone Encounter (Signed)
Patient is calling and left his instruction and exercises given.   Returned call to patient and told him that paperwork will be left at front desk for pick up.

## 2020-12-01 NOTE — Telephone Encounter (Signed)
Patient is going to go with office splint, will have it ready for pick up at front desk today.

## 2020-12-01 NOTE — Progress Notes (Signed)
X-ray cervical spine shows multilevel fusion of C3-3 7.  Multiple levels of arthritis present in the spine.  No acute fractures or broken hardware.

## 2020-12-01 NOTE — Telephone Encounter (Signed)
Patient is requesting a night splint for his Plantar Fasciitis,Please advise

## 2020-12-01 NOTE — Telephone Encounter (Signed)
He can come by office to get one and I can bill to last visit. Or can get on [facility] for 30-50$

## 2020-12-06 ENCOUNTER — Other Ambulatory Visit: Payer: Self-pay | Admitting: Hematology and Oncology

## 2020-12-06 NOTE — Progress Notes (Signed)
[place] [phone]   [phone]  PROGRESS NOTE  Patient Care Team: Inda Coke, [place] as PCP - General (Physician Assistant) Elouise Munroe, MD as PCP - Cardiology (Cardiology)  Hematological/Oncological History # Follicular Lymphoma Stage I Grade IA 1) 05/20/2019: CT scan showed stable mesenteric nodule, largest 4.2 cm and mild periaortic lymphadenopathy.  2) 06/04/2019: biopsy of abdominal lymph node showed grade 1 follicular lymphoma. Observation recommended by his oncologist at [facility] in [place], [place].  3) 03/14/2020: establish care with Dr. Lorenso Courier  4) 04/13/2020: CT scan showed stable mesenteric lymphadenopathy, measuring 3.9 x 1.9 cm. 5) 11/17/2020: presented to ED for abdominal discomfort. Underwent CT A/P and found to have relatively stable lymph node measuring 4.3 x 2.3 cm.   HISTORY OF PRESENTING ILLNESS:  Taylor Berger [name] with medical history significant for HTN, HLD, DVT ,GERD, and follicular lymphoma (diagnosed 06/2019 and under observation) presents for a follow up visit for follicular lymphoma and recurrent RLE DVT.   On exam today Taylor Berger notes he has been well overall in the interim since her last visit.  He notes he underwent an upper and lower GI endoscopy but then subsequently had issues passing gas.  He was taken to emergency department had a CT scan of his abdomen performed on 11/17/2020.  Fortunately this serves our purposes as well.  He was noted to have a cyst on his pancreas which he wanted to discuss today.  He notes that he has been continued on the Xarelto therapy for 9 months without any difficulty.  He denies any bleeding, bruising, or dark stools.  He denies any fevers, chills, sweats, nausea vomiting or diarrhea.  Otherwise a full point ROS is listed below.   MEDICAL HISTORY:  Past Medical History:  Diagnosis Date   Allergy    Seasonal   Aortic aneurysm (HCC)    Depression     DVT (deep venous thrombosis) ([place]) 2020   GERD (gastroesophageal reflux disease)    Goiter    Hyperlipidemia    Hypertension    Lymphoma ([place]) 5732   Follicular, stomach   Neuropathy of both feet     SURGICAL HISTORY: Past Surgical History:  Procedure Laterality Date   CARPAL TUNNEL RELEASE Left    CERVICAL DISCECTOMY  1995   C-7   CERVICAL FUSION  01/1989   C-3 through C-6 / no metal   COLONOSCOPY     ELBOW SURGERY Left    FOOT SURGERY Left 1999   Plantar fascitis   low back fusion  2013   Lumbar & Low Thoracic with metal   SHOULDER ARTHROSCOPY WITH ROTATOR CUFF REPAIR Right 2014   UPPER GASTROINTESTINAL ENDOSCOPY      SOCIAL HISTORY: Social History   Socioeconomic History   Marital status: Married    Spouse name: Not on file   Number of children: Not on file   Years of education: Not on file   Highest education level: Not on file  Occupational History   Occupation: retired   Tobacco Use   Smoking status: Never   Smokeless tobacco: Never  Vaping Use   Vaping Use: Never used  Substance and Sexual Activity   Alcohol use: Not Currently   Drug use: Never   Sexual activity: Not Currently  Other Topics Concern   Not on file  Social History Narrative   ** Merged History Encounter **       M[place] [facility] be closer  to son Married   Social Determinants of Radio broadcast assistant Strain: Low Risk    Difficulty of Paying Living Expenses: Not hard at all  Food Insecurity: No Food Insecurity   Worried About Charity fundraiser in the Last Year: Never true   Arboriculturist in the Last Year: Never true  Transportation Needs: No Transportation Needs   Lack of Transportation (Medical): No   Lack of Transportation (Non-Medical): No  Physical Activity: Inactive   Days of Exercise per Week: 0 days   Minutes of Exercise per Session: 0 min  Stress: No Stress Concern Present   Feeling of Stress : Not at all  Social Connections: Moderately Isolated   Frequency  of Communication with Friends and Family: Three times a week   Frequency of Social Gatherings with Friends and Family: Once a week   Attends Religious Services: Never   Marine scientist or Organizations: No   Attends Music therapist: Never   Marital Status: Married  Human resources officer Violence: Not At Risk   Fear of Current or Ex-Partner: No   Emotionally Abused: No   Physically Abused: No   Sexually Abused: No    FAMILY HISTORY: Family History  Problem Relation Age of Onset   Diabetes Mother    Hypertension Mother    Hyperlipidemia Mother    Heart failure Mother    Leukemia Father    Stroke Father    Esophageal cancer Neg Hx    Colon polyps Neg Hx    Pancreatic cancer Neg Hx    Stomach cancer Neg Hx    Rectal cancer Neg Hx     ALLERGIES:  has No Known Allergies.  MEDICATIONS:  Current Outpatient Medications  Medication Sig Dispense Refill   amLODipine (NORVASC) 10 MG tablet Take 1 tablet by mouth once daily 90 tablet 0   atorvastatin (LIPITOR) 40 MG tablet Take 1 tablet by mouth once daily 90 tablet 0   cetirizine (ZYRTEC) 10 MG tablet Take 10 mg by mouth daily.     clindamycin (CLEOCIN-T) 1 % external solution Apply topically 2 (two) times daily. (Patient taking differently: Apply topically as needed.) 30 mL 0   esomeprazole (NEXIUM) 40 MG capsule Take 1 capsule (40 mg total) by mouth 2 (two) times daily before a meal. 60 capsule 3   fluticasone (FLONASE) 50 MCG/ACT nasal spray Place into both nostrils as needed for allergies or rhinitis.     lisinopril (ZESTRIL) 5 MG tablet Take 1 tablet by mouth once daily 90 tablet 0   rivaroxaban (XARELTO) 10 MG TABS tablet Take 1 tablet (10 mg total) by mouth daily. 90 tablet 2   valACYclovir (VALTREX) 500 MG tablet Take 1 tablet (500 mg total) by mouth daily. 90 tablet 3   No current facility-administered medications for this visit.    REVIEW OF SYSTEMS:   Constitutional: ( - ) fevers, ( - )  chills , ( - )  night sweats Eyes: ( - ) blurriness of vision, ( - ) double vision, ( - ) watery eyes Ears, nose, mouth, throat, and face: ( - ) mucositis, ( - ) sore throat Respiratory: ( - ) cough, ( - ) dyspnea, ( - ) wheezes Cardiovascular: ( - ) palpitation, ( - ) chest discomfort, ( - ) lower extremity swelling Gastrointestinal:  ( - ) nausea, ( - ) heartburn, ( - ) change in bowel habits Skin: ( - ) abnormal skin rashes Lymphatics: ( - )  new lymphadenopathy, ( - ) easy bruising Neurological: ( - ) tingling, ( - ) new weaknesses. + chronic neuropathy in lower extremities Behavioral/Psych: ( - ) mood change, ( - ) new changes  All other systems were reviewed with the patient and are negative.  PHYSICAL EXAMINATION: ECOG PERFORMANCE STATUS: 0 - Asymptomatic  Vitals:   12/07/20 1030  BP: 136/71  Pulse: (!) 53  Resp: 18  Temp: 98.9 F (37.2 C)  SpO2: 97%    Filed Weights   12/07/20 1030  Weight: 208 lb 14.4 oz (94.8 kg)     GENERAL: well appearing elderly Caucasian Berger, appears younger than stated age, in NAD  SKIN: skin color, texture, turgor are normal, no rashes or significant lesions EYES: conjunctiva are pink and non-injected, sclera clear LUNGS: clear to auscultation and percussion with normal breathing effort HEART: regular rate & rhythm and no murmurs. No LE edema or erythema. Musculoskeletal: no cyanosis of digits and no clubbing  PSYCH: alert & oriented x 3, fluent speech NEURO: no focal motor/sensory deficits  LABORATORY DATA:  I have reviewed the data as listed CBC Latest Ref Rng & Units 12/07/2020 11/17/2020 06/01/2020  WBC 4.0 - 10.5 K/uL 7.7 8.4 6.2  Hemoglobin 13.0 - 17.0 g/dL 14.1 14.7 12.9(L)  Hematocrit 39.0 - 52.0 % 42.2 43.7 38.7(L)  Platelets 150 - 400 K/uL 222 203 194    CMP Latest Ref Rng & Units 12/07/2020 11/23/2020 11/17/2020  Glucose 70 - 99 mg/dL 174(H) - 144(H)  BUN 8 - 23 mg/dL 22 - 17  Creatinine 0.61 - 1.24 mg/dL 1.23 - 1.13  Sodium 135 - 145 mmol/L 142  - 141  Potassium 3.5 - 5.1 mmol/L 4.1 - 3.7  Chloride 98 - 111 mmol/L 107 - 109  CO2 22 - 32 mmol/L 24 - 21(L)  Calcium 8.9 - 10.3 mg/dL 10.2 - 9.9  Total Protein 6.5 - 8.1 g/dL 7.0 7.0 7.2  Total Bilirubin 0.3 - 1.2 mg/dL 1.0 0.9 1.8(H)  Alkaline Phos 38 - 126 U/L 77 65 70  AST 15 - 41 U/L _0 ALT 0 - 44 U/L _1 PATHOLOGY:     RADIOGRAPHIC STUDIES: I have personally reviewed the radiological images as listed and agreed with the findings in the report. DG Cervical Spine Complete  Result Date: 11/30/2020 CLINICAL DATA:  Chronic neck pain with left arm weakness. History of cervical fusion. EXAM: CERVICAL SPINE - COMPLETE 4+ VIEW COMPARISON:  None. FINDINGS: Postsurgical straightening of normal lordosis with anterior fusion C3 through C7. Interbody spacers at C3-C4 and C6-C7. Intact hardware without periprosthetic lucency. There is 2 mm anterolisthesis of C2 on C3. There is multilevel facet hypertrophy. Bony neural foraminal stenosis appears more prominent on the left, at C4-C5, C5-C6, and C6-C7. no fracture, evidence of focal bone lesion or bony destruction. No prevertebral soft tissue edema. IMPRESSION: 1. Postsurgical straightening of normal lordosis with anterior fusion C3 through C7. No hardware complication. 2. Multilevel facet hypertrophy. Bony neural foraminal stenosis appears more prominent on the left, at C4-C5, C5-C6, and C6-C7. Electronically Signed   By: Keith Rake M.D.   On: 11/30/2020 16:36   CT ABDOMEN PELVIS W CONTRAST  Result Date: 11/17/2020 CLINICAL DATA:  Abdominal pain, bowel obstruction suspected. Had EGD and colonoscopy this morning. Unable to pass flatus. EXAM: CT ABDOMEN AND PELVIS WITH CONTRAST TECHNIQUE: Multidetector CT imaging of the abdomen and pelvis was performed using the standard protocol following bolus administration of  intravenous contrast. CONTRAST:  41m OMNIPAQUE IOHEXOL 350 MG/ML SOLN COMPARISON:  CT abdomen pelvis 04/13/2020  FINDINGS: Lower chest: Stable scarring at the posterior right lower lobe. Minimal dependent atelectasis. A 1.1 cm paraesophageal lymph node is unchanged compared to 03/23/2020 (series 2, image 1). Hepatobiliary: No focal liver abnormality is seen. No gallstones, gallbladder wall thickening, or biliary dilatation. Pancreas: Diffuse moderate parenchymal atrophy and moderate fatty infiltration. A 0.7 cm oval homogeneous hypodense lesion is noted at the tail of the pancreas (series 4, image 92. No dilatation of pancreatic duct or peripancreatic fat stranding. Spleen: Normal in size without focal abnormality. Adrenals/Urinary Tract: Cortical scarring at the upper third of the left kidney. A 0.5 cm round homogeneous hypodense cortical lesion is noted of the lower pole of the right kidney, too small to characterize but likely a benign cyst (series 2, image 50). The kidneys are otherwise unremarkable. Adrenal glands are unremarkable. Stomach/Bowel: Stomach is within normal limits. Appendix appears normal. No evidence of bowel wall thickening, distention, or inflammatory changes. Gaseous distension throughout the colon, consistent with history of earlier same day colonoscopy. Diverticulosis of the proximal sigmoid colon. Vascular/Lymphatic: Enlarged mesenteric lymph nodes in the small bowel mesentery in the right abdomen, with a probable conglomerate of lymph nodes measuring up to 4.3 by 2.3 cm (series 2, image 40). There is a misty appearance of the surrounding mesenteric fat with tumoral pseudo capsule surrounding the mesenteric fat. Overall, the appearance is unchanged compared to 04/13/2020. No new lymphadenopathy in the abdomen or pelvis. Mild scattered calcific atherosclerosis of the abdominal aorta. No abdominal aortic aneurysm. Reproductive: Enlarged prostate with central calcifications and mass effect on the posteroinferior aspect of the urinary bladder. Other: No free intraperitoneal air. No ascites. Bilateral  small fat containing inguinal hernias. Musculoskeletal: Posterior fusion hardware spans L3-S1. Unchanged grade 2 anterolisthesis of L5 on S1. Multilevel degenerative disc disease in the visualized distal thoracic and upper lumbar spine. IMPRESSION: 1. No acute abnormality in the abdomen or pelvis. Specifically, no findings of bowel perforation, obstruction, or inflammatory changes of the bowel. Gaseous distention of the colon, consistent with earlier same day colonoscopy. 2. 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 3. Incidentally noted 0.7 cm cystic lesion in the tail of the pancreas. Recommend follow-up cross-sectional imaging with pancreas protocol CT or MRI in 2 years to assess stability. Electronically Signed   By: LIleana RoupM.D.   On: 11/17/2020 14:18     ASSESSMENT & PLAN Taylor Berger with medical history significant for HTN, HLD, DVT ,GERD, and follicular lymphoma (diagnosed 06/2019 and under observation) presents to establish care with oncology for his follicular lymphoma.  After review the labs, review the records, discussion with the patient the findings are most consistent with a low-grade stage I follicular lymphoma of the lymph node of the abdomen.  At this time there is no indication for treatment as there is no bulky disease, no B symptoms, and no cytopenias.  We can continue to follow this patient in clinic with lab work and clinical follow-up.   #Grade IA [place] Grade IA follicular lymphoma with a Ki-67 of 5-10%. Currently on observation.  --Reviewed labs from today without any intervention needed. CT abdomen/pelvis from 04/13/20 revealed stable mesenteric lymphadenopathy. Recommendation is to continue with observation and return in 6 months with lab work. Last CT scan showed stable disease, no indication for repeat imaging. Plan: --continue monitoring  with q  6 month clinic visits and labs  #DVT x 2 episodes.  --Initially diagnosed with DVT of the right femoral through popliteal veins on 12/10/2018. Treated with 6 months of anticoagulation therapy. At initial visit, discussed the risks of recurrent VTE and the recommendation for indefinite anticoagulation as it was likely an unprovoked event. Patient elected to continue with observation for recurrent clot. --On 03/30/20, patient had another DVT of the right lower extremity. Doppler US was obtained that revealed acute DVT in the right gastrocnemius veins, right intramuscular calf vein. In addition, there were two pairs of acutely thrombosed veins appear to arise from the popliteal vein and are thrombosed to the mid calf. No acute DVT in the popliteal vein. There was evidence of chronic deep vein thrombosis involving the right femoral vein, and popliteal vein. Likely provoked by recent episode of diverticulitis. Patient was started on Xarelto 43m PO daily.  --Discussed the recommendation for indefinite anticoagulation due to recurrent episodes of DVTs, with initial episode that was likely unprovoked. Patient was in agreement with this recommendation.  Plan:  --transition to maintenance Xaretlo 10 mg PO daily. Will continue indefinitely.  --RTC in 6 months time  #Health maintenance --Patient follow with GI for Barrett's esophagus.  --Patient is up to date on COVID and influenza vaccinations.   #Right mid thyroid nodule --Underwent fine needle aspiration on 05/31/20, results show benign tissue.   No orders of the defined types were placed in this encounter.   All questions were answered. The patient knows to call the clinic with any problems, questions or concerns.  JLedell Peoples MD Department of Hematology/Oncology [place] [facility]at [facility] LLCPhone: [phone]: [phone]: [email or web address]  12/07/2020 2:29 PM

## 2020-12-07 ENCOUNTER — Inpatient Hospital Stay (HOSPITAL_BASED_OUTPATIENT_CLINIC_OR_DEPARTMENT_OTHER): Payer: Medicare Other | Admitting: Hematology and Oncology

## 2020-12-07 ENCOUNTER — Inpatient Hospital Stay: Payer: Medicare Other

## 2020-12-07 ENCOUNTER — Encounter: Payer: Self-pay | Admitting: Neurology

## 2020-12-07 ENCOUNTER — Other Ambulatory Visit: Payer: Self-pay

## 2020-12-07 VITALS — BP 136/71 | HR 53 | Temp 98.9°F | Resp 18 | Wt 208.9 lb

## 2020-12-07 DIAGNOSIS — C8200 Follicular lymphoma grade I, unspecified site: Secondary | ICD-10-CM | POA: Insufficient documentation

## 2020-12-07 DIAGNOSIS — I82411 Acute embolism and thrombosis of right femoral vein: Secondary | ICD-10-CM

## 2020-12-07 DIAGNOSIS — C8203 Follicular lymphoma grade I, intra-abdominal lymph nodes: Secondary | ICD-10-CM

## 2020-12-07 DIAGNOSIS — E079 Disorder of thyroid, unspecified: Secondary | ICD-10-CM | POA: Insufficient documentation

## 2020-12-07 DIAGNOSIS — Z86718 Personal history of other venous thrombosis and embolism: Secondary | ICD-10-CM | POA: Diagnosis not present

## 2020-12-07 DIAGNOSIS — Z7901 Long term (current) use of anticoagulants: Secondary | ICD-10-CM | POA: Insufficient documentation

## 2020-12-07 DIAGNOSIS — R202 Paresthesia of skin: Secondary | ICD-10-CM

## 2020-12-07 LAB — CMP (CANCER CENTER ONLY)
ALT: 23 U/L (ref 0–44)
AST: 19 U/L (ref 15–41)
Albumin: 4 g/dL (ref 3.5–5.0)
Alkaline Phosphatase: 77 U/L (ref 38–126)
Anion gap: 11 (ref 5–15)
BUN: 22 mg/dL (ref 8–23)
CO2: 24 mmol/L (ref 22–32)
Calcium: 10.2 mg/dL (ref 8.9–10.3)
Chloride: 107 mmol/L (ref 98–111)
Creatinine: 1.23 mg/dL (ref 0.61–1.24)
GFR, Estimated: >60 mL/min (ref >60)
Glucose, Bld: 174 mg/dL — ABNORMAL HIGH (ref 70–99)
Potassium: 4.1 mmol/L (ref 3.5–5.1)
Sodium: 142 mmol/L (ref 135–145)
Total Bilirubin: 1 mg/dL (ref 0.3–1.2)
Total Protein: 7 g/dL (ref 6.5–8.1)

## 2020-12-07 LAB — CBC WITH DIFFERENTIAL (CANCER CENTER ONLY)
Abs Immature Granulocytes: 0.04 10*3/uL (ref 0.00–0.07)
Basophils Absolute: 0.1 10*3/uL (ref 0.0–0.1)
Basophils Relative: 1 %
Eosinophils Absolute: 0.2 10*3/uL (ref 0.0–0.5)
Eosinophils Relative: 3 %
HCT: 42.2 % (ref 39.0–52.0)
Hemoglobin: 14.1 g/dL (ref 13.0–17.0)
Immature Granulocytes: 1 %
Lymphocytes Relative: 26 %
Lymphs Abs: 2 10*3/uL (ref 0.7–4.0)
MCH: 32 pg (ref 26.0–34.0)
MCHC: 33.4 g/dL (ref 30.0–36.0)
MCV: 95.7 fL (ref 80.0–100.0)
Monocytes Absolute: 0.8 10*3/uL (ref 0.1–1.0)
Monocytes Relative: 11 %
Neutro Abs: 4.5 10*3/uL (ref 1.7–7.7)
Neutrophils Relative %: 58 %
Platelet Count: 222 10*3/uL (ref 150–400)
RBC: 4.41 MIL/uL (ref 4.22–5.81)
RDW: 12.4 % (ref 11.5–15.5)
WBC Count: 7.7 10*3/uL (ref 4.0–10.5)
nRBC: 0 % (ref 0.0–0.2)

## 2020-12-07 LAB — LACTATE DEHYDROGENASE: LDH: 164 U/L (ref 98–192)

## 2020-12-07 MED ORDER — RIVAROXABAN 10 MG PO TABS: 10.0000 mg | ORAL_TABLET | Freq: Every day | ORAL | 2 refills | Status: DC

## 2020-12-08 ENCOUNTER — Encounter: Payer: Self-pay | Admitting: Physician Assistant

## 2020-12-08 DIAGNOSIS — M542 Cervicalgia: Secondary | ICD-10-CM

## 2020-12-12 ENCOUNTER — Telehealth: Payer: Self-pay | Admitting: *Deleted

## 2020-12-12 NOTE — Telephone Encounter (Signed)
Patient is calling to requesting an injection for a plantar fasciitis flare up.  Returned call to patient to schedule. He has been scheduled/confirmed for appointment w/ Dr Paulla Dolly.

## 2020-12-14 ENCOUNTER — Ambulatory Visit (INDEPENDENT_AMBULATORY_CARE_PROVIDER_SITE_OTHER): Payer: Medicare Other | Admitting: Podiatry

## 2020-12-14 ENCOUNTER — Other Ambulatory Visit: Payer: Self-pay

## 2020-12-14 ENCOUNTER — Encounter: Payer: Self-pay | Admitting: Podiatry

## 2020-12-14 DIAGNOSIS — I2510 Atherosclerotic heart disease of native coronary artery without angina pectoris: Secondary | ICD-10-CM

## 2020-12-14 DIAGNOSIS — M722 Plantar fascial fibromatosis: Secondary | ICD-10-CM

## 2020-12-14 DIAGNOSIS — M76822 Posterior tibial tendinitis, left leg: Secondary | ICD-10-CM | POA: Diagnosis not present

## 2020-12-14 MED ORDER — TRIAMCINOLONE ACETONIDE 10 MG/ML IJ SUSP
10.0000 mg | Freq: Once | INTRAMUSCULAR | Status: AC
  Administered 2020-12-14: 10 mg

## 2020-12-15 NOTE — Progress Notes (Signed)
Subjective:   Patient ID: Taylor Berger, male   DOB: 75 y.o.   MRN: [id number]   HPI Patient states he was doing very well but has developed a flareup in the bottom of his right heel   ROS      Objective:  Physical Exam  Neurovascular status intact with discomfort in the plantar right heel that is present and has fluid buildup with him wearing a boot currently     Assessment:  Reoccurrence of acute plantar fasciitis right     Plan:  Discussed things he may have done that caused this and that this point we will do 1 last injection was sterile prep and injected the plantar fascia 3 mg Kenalog 5 mg Xylocaine continue boot usage and if improvement not forthcoming may have to consider more rest of treatment plan

## 2021-01-03 ENCOUNTER — Encounter: Payer: Medicare Other | Admitting: Neurology

## 2021-01-04 ENCOUNTER — Ambulatory Visit (INDEPENDENT_AMBULATORY_CARE_PROVIDER_SITE_OTHER): Payer: Medicare Other | Admitting: Physician Assistant

## 2021-01-04 ENCOUNTER — Encounter: Payer: Self-pay | Admitting: Physician Assistant

## 2021-01-04 VITALS — BP 126/74 | HR 50 | Ht 68.0 in | Wt 206.0 lb

## 2021-01-04 DIAGNOSIS — I2510 Atherosclerotic heart disease of native coronary artery without angina pectoris: Secondary | ICD-10-CM | POA: Diagnosis not present

## 2021-01-04 DIAGNOSIS — Z8719 Personal history of other diseases of the digestive system: Secondary | ICD-10-CM

## 2021-01-04 DIAGNOSIS — R1319 Other dysphagia: Secondary | ICD-10-CM

## 2021-01-04 NOTE — Progress Notes (Signed)
Reviewed and agree with management plans. ? ?Jace Dowe L. Melessa Cowell, MD, MPH  ?

## 2021-01-04 NOTE — Progress Notes (Signed)
Chief Complaint: Follow-up GERD  HPI:    Taylor Berger is a 75 year old Caucasian male with a past medical history as listed below including DVT on Xarelto and reflux with Barrett's esophagus, known to Dr. Tarri Glenn, who presents to clinic today for follow-up of his GERD.    10/27/2020 office visit with Dr. Tarri Glenn.  At that time discussed that he had intermittent dysphagia over the past 2 to 3 months.  Apparently had Barrett's esophagus diagnosed in [place] and was continued on daily PPI.  He also had a history of colon polyps with a tubular adenoma and colonoscopy in [place] 2016.  At that time arrange for EGD and colonoscopy.    11/17/2020 colonoscopy with hemorrhoids, nonbleeding external and internal, diverticulosis in the sigmoid and descending colon, 1 3 mm polyp in the descending colon, 2 to-3 mm polyps in the ascending colon otherwise normal.  Polyps showed tubular adenomas and hyperplastic.  Repeat surveillance was recommended in 7 years if you still focusing on preventative health measures at that time.    11/17/2020 EGD with no endoscopic esophageal abnormality to explain patient's dysphagia, esophagus was empirically dilated and gastritis.  Pathology showed gastritis and benign fundic gland polyps with reflux esophagitis.  There was no evidence of Barrett's.  Was recommended that he increase his Nexium to 40 mg twice daily for 12 weeks, then go back to once daily.    Today, the patient tells me he has been taking the Nexium 40 mg twice a day over the past 8 weeks on has had no further difficulties with swallowing.  He has also had no reflux breakthrough.  Explains that he typically is only on over-the-counter Nexium 20 mg nightly and this tends to keep his symptoms at bay.  He asks when he can go back to this dosing.    Most pressing for him now is that he has plantar fasciitis in his right foot and is following with orthopedics about that.    Denies fever, chills, weight loss, abdominal pain or  symptoms that awaken him from sleep.  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, stomach   Neuropathy of both feet     Past Surgical History:  Procedure Laterality Date   CARPAL TUNNEL RELEASE Left    CERVICAL DISCECTOMY  1995   C-7   CERVICAL FUSION  01/1989   C-3 through C-6 / no metal   COLONOSCOPY     ELBOW SURGERY Left    FOOT SURGERY Left 1999   Plantar fascitis   low back fusion  2013   Lumbar & Low Thoracic with metal   SHOULDER ARTHROSCOPY WITH ROTATOR CUFF REPAIR Right 2014   UPPER GASTROINTESTINAL ENDOSCOPY      Current Outpatient Medications  Medication Sig Dispense Refill   amLODipine (NORVASC) 10 MG tablet Take 1 tablet by mouth once daily 90 tablet 0   atorvastatin (LIPITOR) 40 MG tablet Take 1 tablet by mouth once daily 90 tablet 0   cetirizine (ZYRTEC) 10 MG tablet Take 10 mg by mouth daily.     chlorpheniramine-HYDROcodone (TUSSIONEX) 10-8 MG/5ML SUER Take 5 mLs by mouth at bedtime as needed.     clindamycin (CLEOCIN-T) 1 % external solution  Apply topically 2 (two) times daily. (Patient taking differently: Apply topically as needed.) 30 mL 0   esomeprazole (NEXIUM) 40 MG capsule Take 1 capsule (40 mg total) by mouth 2 (two) times daily before a meal. 60 capsule 3   fluticasone (FLONASE) 50 MCG/ACT nasal spray Place into both nostrils as needed for allergies or rhinitis.     lisinopril (ZESTRIL) 5 MG tablet Take 1 tablet by mouth once daily 90 tablet 0   rivaroxaban (XARELTO) 10 MG TABS tablet Take 1 tablet (10 mg total) by mouth daily. 90 tablet 2   valACYclovir (VALTREX) 500 MG tablet Take 1 tablet (500 mg total) by mouth daily. 90 tablet 3   No current facility-administered medications for this visit.    Allergies as of 01/04/2021    (No Known Allergies)    Family History  Problem Relation Age of Onset   Diabetes Mother    Hypertension Mother    Hyperlipidemia Mother    Heart failure Mother    Leukemia Father    Stroke Father    Esophageal cancer Neg Hx    Colon polyps Neg Hx    Pancreatic cancer Neg Hx    Stomach cancer Neg Hx    Rectal cancer Neg Hx     Social History   Socioeconomic History   Marital status: Married    Spouse name: Not on file   Number of children: Not on file   Years of education: Not on file   Highest education level: Not on file  Occupational History   Occupation: retired   Tobacco Use   Smoking status: Never   Smokeless tobacco: Never  Vaping Use   Vaping Use: Never used  Substance and Sexual Activity   Alcohol use: Not Currently   Drug use: Never   Sexual activity: Not Currently  Other Topics Concern   Not on file  Social History Narrative   ** Merged History Encounter **       Moved from [place] to be closer to son Married   Social Determinants of Radio broadcast assistant Strain: Low Risk    Difficulty of Paying Living Expenses: Not hard at all  Food Insecurity: No Food Insecurity   Worried About Charity fundraiser in the Last Year: Never true   Arboriculturist in the Last Year: Never true  Transportation Needs: No Transportation Needs   Lack of Transportation (Medical): No   Lack of Transportation (Non-Medical): No  Physical Activity: Inactive   Days of Exercise per Week: 0 days   Minutes of Exercise per Session: 0 min  Stress: No Stress Concern Present   Feeling of Stress : Not at all  Social Connections: Moderately Isolated   Frequency of Communication with Friends and Family: Three times a week   Frequency of Social Gatherings with Friends and Family: Once a week   Attends Religious Services: Never   Marine scientist or Organizations: No   Attends Music therapist: Never   Marital Status: Married  Human resources officer Violence: Not  At Risk   Fear of Current or Ex-Partner: No   Emotionally Abused: No   Physically Abused: No   Sexually Abused: No    Review of Systems:    Constitutional: No weight loss, fever or chills Cardiovascular: No chest pain Respiratory: No SOB Gastrointestinal: See HPI and otherwise negative   Physical Exam:  Vital signs: BP 126/74   Pulse (!) 50   Ht 5'  8" (1.727 m)   Wt 206 lb (93.4 kg)   SpO2 96%   BMI 31.32 kg/m    Constitutional:   Pleasant Caucasian male appears to be in NAD, Well developed, Well nourished, alert and cooperative Respiratory: Respirations even and unlabored. Lungs clear to auscultation bilaterally.   No wheezes, crackles, or rhonchi.  Cardiovascular: Normal S1, S2. No MRG. Regular rate and rhythm. No peripheral edema, cyanosis or pallor.  Gastrointestinal:  Soft, nondistended, nontender. No rebound or guarding. Normal bowel sounds. No appreciable masses or hepatomegaly. Psychiatric:Demonstrates good judgement and reason without abnormal affect or behaviors.  RELEVANT LABS AND IMAGING: CBC    Component Value Date/Time   WBC 7.7 12/07/2020 1005   WBC 8.4 11/17/2020 1052   RBC 4.41 12/07/2020 1005   HGB 14.1 12/07/2020 1005   HCT 42.2 12/07/2020 1005   PLT 222 12/07/2020 1005   MCV 95.7 12/07/2020 1005   MCH 32.0 12/07/2020 1005   MCHC 33.4 12/07/2020 1005   RDW 12.4 12/07/2020 1005   LYMPHSABS 2.0 12/07/2020 1005   MONOABS 0.8 12/07/2020 1005   EOSABS 0.2 12/07/2020 1005   BASOSABS 0.1 12/07/2020 1005    CMP     Component Value Date/Time   NA 142 12/07/2020 1005   NA 144 02/24/2020 1521   K 4.1 12/07/2020 1005   CL 107 12/07/2020 1005   CO2 24 12/07/2020 1005   GLUCOSE 174 (H) 12/07/2020 1005   BUN 22 12/07/2020 1005   BUN 16 02/24/2020 1521   CREATININE 1.23 12/07/2020 1005   CALCIUM 10.2 12/07/2020 1005   PROT 7.0 12/07/2020 1005   ALBUMIN 4.0 12/07/2020 1005   AST 19 12/07/2020 1005   ALT 23 12/07/2020 1005   ALKPHOS 77 12/07/2020  1005   BILITOT 1.0 12/07/2020 1005   GFRNONAA >60 12/07/2020 1005   GFRAA 98 02/24/2020 1521    Assessment: 1.  GERD: Better over the past 8 weeks on Nexium 40 twice daily 2.  Dysphagia: Better after empiric dilation and Nexium 40 twice daily for the past 8 weeks; consider element of reflux +/- stricture  Plan: 1.  Discussed with patient that plan was for him to be on Nexium 40 mg twice daily for a full 12 weeks.  This would mean that he can decrease to once daily dosing around November 10.  Discussed that if he continues to symptoms when he decreases back to his normal dosing then he should call and let us know but would increase to at least 40 mg a day. 2.  Patient to follow in clinic with [place] as needed.  Ellouise Newer, PA-C [facility] 01/04/2021, 1:24 PM  Cc: Inda Coke, [place]

## 2021-01-06 ENCOUNTER — Other Ambulatory Visit: Payer: Self-pay | Admitting: Family Medicine

## 2021-01-07 ENCOUNTER — Encounter: Payer: Self-pay | Admitting: Physician Assistant

## 2021-01-16 ENCOUNTER — Encounter: Payer: Self-pay | Admitting: Physician Assistant

## 2021-01-31 ENCOUNTER — Telehealth: Payer: Self-pay

## 2021-01-31 DIAGNOSIS — Z1159 Encounter for screening for other viral diseases: Secondary | ICD-10-CM

## 2021-01-31 NOTE — Telephone Encounter (Signed)
Pt called for HEP C test. Can orders be placed? Please Advise.

## 2021-02-01 ENCOUNTER — Telehealth: Payer: Self-pay

## 2021-02-01 NOTE — Telephone Encounter (Signed)
Please contact pt to schedule lab appt. I have placed the orders in Epic for Hep C test.

## 2021-02-01 NOTE — Telephone Encounter (Signed)
Noted  

## 2021-02-01 NOTE — Telephone Encounter (Signed)
error 

## 2021-02-01 NOTE — Telephone Encounter (Signed)
Talked to pt's wife. Taylor Berger will call back to schedule.

## 2021-02-03 ENCOUNTER — Other Ambulatory Visit: Payer: Self-pay

## 2021-02-03 ENCOUNTER — Other Ambulatory Visit: Payer: Medicare Other

## 2021-02-03 DIAGNOSIS — Z1159 Encounter for screening for other viral diseases: Secondary | ICD-10-CM

## 2021-02-06 LAB — HEPATITIS C ANTIBODY
Hepatitis C Ab: NONREACTIVE
SIGNAL TO CUT-OFF: 0.06 (ref <1.00)

## 2021-02-14 ENCOUNTER — Telehealth: Payer: Self-pay

## 2021-02-14 NOTE — Telephone Encounter (Signed)
Patient Name: Taylor Berger Gender: Male DOB: 02-28-1946 Age: 75 Y 11 M 1 D Return Phone Number: [phone] (Primary), [phone] (Secondary) Address: City/ State/ Zip: [place]  [address] Client [place] [facility] [place] [facility] Morene Rankins, [place]- [place] Contact Type Call Who Is Calling Patient / Member / Family / Caregiver Call Type Triage / Clinical Relationship To Patient Self Return Phone Number [phone] (Primary) Chief Complaint Unclassified Symptom Reason for Call Symptomatic / Request for [place] states he is cold all the time. Translation No Nurse Assessment Nurse: Gildardo Pounds, RN, Amy Date/Time (Eastern Time): 02/14/2021 3:50:20 PM Confirm and document reason for call. If symptomatic, describe symptoms. ---Caller states he is cold all the time. No other symptoms. He feels fine. He has gotten his flu shot. He will have layers of clothes on & a blanket & get cold real quick. He moved here from [facility] last year. He thought it was due to the difference in the weather, but even this past summer he would feel cold. He wants to make sure it is not caused by something medically. No fever . Does the patient have any new or worsening symptoms? ---Yes Will a triage be completed? ---Yes Related visit to physician within the last 2 weeks? ---No Does the PT have any chronic conditions? (i.e. diabetes, asthma, this includes High risk factors for pregnancy, etc.) ---Yes List chronic conditions. ---Lymphoma, goiter, HTN, high cholesterol. neuropathy, nerve damage left arm Is this a behavioral health or substance abuse call? ---No Guidelines Guideline Title Affirmed Question Affirmed Notes Nurse Date/Time (Eastern Time) Cold Exposure (Hypothermia) History of hypothyroidism Lovelace, RN, Amy 02/14/2021 3:54:54 PM PLEASE NOTE: All timestamps contained  within this report are represented as [place] Standard Time. CONFIDENTIALTY NOTICE: This fax transmission is intended only for the addressee. It contains information that is legally privileged, confidential or otherwise protected from use or disclosure. If you are not the intended recipient, you are strictly prohibited from reviewing, disclosing, copying using or disseminating any of this information or taking any action in reliance on or regarding this information. If you have received this fax in error, please notify us immediately by telephone so that we can arrange for its return to [place]. Phone: [phone], Toll-Free: [phone], Fax: [phone] Page: 2 of 2 Call Id: [id number] [place]. Time Eilene Ghazi Time) Disposition Final User 02/14/2021 3:59:35 PM SEE PCP WITHIN 3 DAYS Yes Lovelace, RN, Amy Caller Disagree/Comply Disagree Caller Understands Yes PreDisposition InappropriateToAsk Care Advice Given Per Guideline SEE PCP WITHIN 3 DAYS: * You need to be seen within 2 or 3 days. * PCP VISIT: Call your doctor (or NP/PA) during regular office hours and make an appointment. A clinic or urgent care center are good places to go for care if your doctor's office is closed or you can't get an appointment. NOTE: If office will be open tomorrow, tell caller to call then, not in 3 days. REWARMING: * Move into a warm room. * Remove any wet/cold clothing. * OR put on dry clothes and cover yourself with several warm blankets. (Blankets can be warmed in a dryer) WARM FLUIDS: * Drink warm fluids (e.g., hot chocolate, hot milk, hot apple cider). CALL BACK IF: * You become worse CARE ADVICE given per Cold Exposure (Adult) guideline. Comments User: Wayne Sever, RN Date/Time Eilene Ghazi Time): 02/14/2021 4:02:39 PM Patient states he has an appt. on the 21st. He wants to wait until then  to be seen. Referrals GO TO FACILITY REFUSED

## 2021-02-14 NOTE — Telephone Encounter (Signed)
Pt is scheduled for 11/21.

## 2021-02-15 NOTE — Telephone Encounter (Signed)
FYI, see message. 

## 2021-02-20 ENCOUNTER — Other Ambulatory Visit: Payer: Self-pay

## 2021-02-20 ENCOUNTER — Encounter: Payer: Self-pay | Admitting: Physician Assistant

## 2021-02-20 ENCOUNTER — Ambulatory Visit (INDEPENDENT_AMBULATORY_CARE_PROVIDER_SITE_OTHER): Payer: Medicare Other | Admitting: Physician Assistant

## 2021-02-20 VITALS — BP 120/80 | HR 72 | Temp 97.9°F | Ht 68.0 in | Wt 210.4 lb

## 2021-02-20 DIAGNOSIS — R638 Other symptoms and signs concerning food and fluid intake: Secondary | ICD-10-CM

## 2021-02-20 DIAGNOSIS — G629 Polyneuropathy, unspecified: Secondary | ICD-10-CM | POA: Diagnosis not present

## 2021-02-20 DIAGNOSIS — E049 Nontoxic goiter, unspecified: Secondary | ICD-10-CM

## 2021-02-20 DIAGNOSIS — R6889 Other general symptoms and signs: Secondary | ICD-10-CM | POA: Diagnosis not present

## 2021-02-20 LAB — IBC + FERRITIN
Ferritin: 50.9 ng/mL (ref 22.0–322.0)
Iron: 57 ug/dL (ref 42–165)
Saturation Ratios: 15.1 % — ABNORMAL LOW (ref 20.0–50.0)
TIBC: 378 ug/dL (ref 250.0–450.0)
Transferrin: 270 mg/dL (ref 212.0–360.0)

## 2021-02-20 LAB — CBC WITH DIFFERENTIAL/PLATELET
Basophils Absolute: 0 10*3/uL (ref 0.0–0.1)
Basophils Relative: 0.5 % (ref 0.0–3.0)
Eosinophils Absolute: 0.2 10*3/uL (ref 0.0–0.7)
Eosinophils Relative: 3.8 % (ref 0.0–5.0)
HCT: 40.2 % (ref 39.0–52.0)
Hemoglobin: 13.5 g/dL (ref 13.0–17.0)
Lymphocytes Relative: 27.5 % (ref 12.0–46.0)
Lymphs Abs: 1.8 10*3/uL (ref 0.7–4.0)
MCHC: 33.7 g/dL (ref 30.0–36.0)
MCV: 95.2 fl (ref 78.0–100.0)
Monocytes Absolute: 0.7 10*3/uL (ref 0.1–1.0)
Monocytes Relative: 10.8 % (ref 3.0–12.0)
Neutro Abs: 3.8 10*3/uL (ref 1.4–7.7)
Neutrophils Relative %: 57.4 % (ref 43.0–77.0)
Platelets: 216 10*3/uL (ref 150.0–400.0)
RBC: 4.22 Mil/uL (ref 4.22–5.81)
RDW: 13.8 % (ref 11.5–15.5)
WBC: 6.5 10*3/uL (ref 4.0–10.5)

## 2021-02-20 LAB — VITAMIN B12: Vitamin B-12: 328 pg/mL (ref 211–911)

## 2021-02-20 LAB — COMPREHENSIVE METABOLIC PANEL
ALT: 23 U/L (ref 0–53)
AST: 19 U/L (ref 0–37)
Albumin: 4.2 g/dL (ref 3.5–5.2)
Alkaline Phosphatase: 70 U/L (ref 39–117)
BUN: 19 mg/dL (ref 6–23)
CO2: 28 mEq/L (ref 19–32)
Calcium: 9.4 mg/dL (ref 8.4–10.5)
Chloride: 105 mEq/L (ref 96–112)
Creatinine, Ser: 1.03 mg/dL (ref 0.40–1.50)
GFR: 70.35 mL/min (ref >60.00)
Glucose, Bld: 151 mg/dL — ABNORMAL HIGH (ref 70–99)
Potassium: 3.6 mEq/L (ref 3.5–5.1)
Sodium: 140 mEq/L (ref 135–145)
Total Bilirubin: 0.7 mg/dL (ref 0.2–1.2)
Total Protein: 6.4 g/dL (ref 6.0–8.3)

## 2021-02-20 LAB — TSH: TSH: 3.38 u[IU]/mL (ref 0.35–5.50)

## 2021-02-20 LAB — T4, FREE: Free T4: 0.77 ng/dL (ref 0.60–1.60)

## 2021-02-20 NOTE — Progress Notes (Signed)
Taylor Berger is a 75 y.o. male here for cold intolerance.   History of Present Illness:   Chief Complaint  Patient presents with  . cold intolerance    Pt is c/o feeling cold all the time. Wears socks to bed.    HPI  Cold Intolerance Taylor Berger presents with c/o cold intolerance that has been onset for a while now. Initially Taylor Berger believed this to be caused by his body not being used to the weather change, moving from [place] to [place]. Reports he is constantly feeling cold which leads him to bundling up and messing with his thermostat often. Additionally, he has had a small weight gain but believes that to be caused by stress related to the recent move. He is also experiencing constipation following a meal that soon transitions to diarrhea.   Pt does have a hx of goiter and was regularly seeing Dr. Lucia Gaskins regarding this issue prior to this provider's retirement. Back in May of this year, he did have a ultrasound of his thyroid performed that found a 1.9 cm nodule on the right side of his thyroid. This was found to be benign and since he wasn't experiencing any sx, no action was taken or needed.   [name] states he mainly has a vegetarian like diet due to his wife being a vegetarian. He occasionally intakes meat, but not often. He does remember having at least one B12 injection in his lifetime, but doesn't remember much regarding this.   Continues to undergo work-up of L arm weakness/neuropathy with sports medicine and neuro.  Past Medical History:  Diagnosis Date  . Allergy    Seasonal  . Aortic aneurysm ([facility])   . Depression   . DVT (deep venous thrombosis) ([facility]) 2020  . GERD (gastroesophageal reflux disease)   . Goiter   . Hyperlipidemia   . Hypertension   . 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 Neuropathy of both feet      Social History   Tobacco Use  . Smoking status: Never  . Smokeless tobacco: Never  Vaping Use  . Vaping Use: Never used  Substance Use Topics  .  Alcohol use: Not Currently  . Drug use: Never    Past Surgical History:  Procedure Laterality Date  . CARPAL TUNNEL RELEASE Left   . CERVICAL DISCECTOMY  1995   C-7  . CERVICAL FUSION  01/1989   C-3 through C-6 / no metal  . COLONOSCOPY    . ELBOW SURGERY Left   . FOOT SURGERY Left 1999   Plantar fascitis  . low back fusion  2013   Lumbar & Low Thoracic with metal  . SHOULDER ARTHROSCOPY WITH ROTATOR CUFF REPAIR Right 2014  . UPPER GASTROINTESTINAL ENDOSCOPY      Family History  Problem Relation Age of Onset  . Diabetes Mother   . Hypertension Mother   . Hyperlipidemia Mother   . Heart failure Mother   . Leukemia Father   . Stroke Father   . Esophageal cancer Neg Hx   . Colon polyps Neg Hx   . Pancreatic cancer Neg Hx   . Stomach cancer Neg Hx   . Rectal cancer Neg Hx     No Known Allergies  Current Medications:   Current Outpatient Medications:  .  amLODipine (NORVASC) 10 MG tablet, Take 1 tablet by mouth once daily, Disp: 90 tablet, Rfl: 0 .  atorvastatin (LIPITOR) 40 MG tablet, Take 1 tablet by mouth once daily, Disp: 90  tablet, Rfl: 0 .  cetirizine (ZYRTEC) 10 MG tablet, Take 10 mg by mouth daily., Disp: , Rfl:  .  clindamycin (CLEOCIN-T) 1 % external solution, Apply topically 2 (two) times daily. (Patient taking differently: Apply topically as needed.), Disp: 30 mL, Rfl: 0 .  esomeprazole (NEXIUM) 20 MG capsule, Take 20 mg by mouth daily at 12 noon., Disp: , Rfl:  .  fluticasone (FLONASE) 50 MCG/ACT nasal spray, Place into both nostrils as needed for allergies or rhinitis., Disp: , Rfl:  .  lisinopril (ZESTRIL) 5 MG tablet, Take 1 tablet by mouth once daily, Disp: 90 tablet, Rfl: 0 .  rivaroxaban (XARELTO) 10 MG TABS tablet, Take 1 tablet (10 mg total) by mouth daily., Disp: 90 tablet, Rfl: 2 .  valACYclovir (VALTREX) 500 MG tablet, Take 1 tablet (500 mg total) by mouth daily., Disp: 90 tablet, Rfl: 3   Review of Systems:   ROS Negative unless otherwise  specified per HPI. Vitals:   Vitals:   02/20/21 1114  BP: 120/80  Pulse: 72  Temp: 97.9 F (36.6 C)  TempSrc: Temporal  SpO2: 96%  Weight: 210 lb 6.1 oz (95.4 kg)  Height: 5\' 8"  (1.727 m)     Body mass index is 31.99 kg/m.  Physical Exam:   Physical Exam Vitals and nursing note reviewed.  Constitutional:      General: He is not in acute distress.    Appearance: He is well-developed. He is not ill-appearing or toxic-appearing.  Cardiovascular:     Rate and Rhythm: Normal rate and regular rhythm.     Pulses: Normal pulses.     Heart sounds: Normal heart sounds, S1 normal and S2 normal.  Pulmonary:     Effort: Pulmonary effort is normal.     Breath sounds: Normal breath sounds.  Skin:    General: Skin is warm and dry.  Neurological:     Mental Status: He is alert.     GCS: GCS eye subscore is 4. GCS verbal subscore is 5. GCS motor subscore is 6.  Psychiatric:        Speech: Speech normal.        Behavior: Behavior normal. Behavior is cooperative.    Assessment and Plan:   Sensation of feeling cold Update blood work today to r/o organic cause of symptoms and provide recommendations accordingly Consider endocrinology referral if all blood work is normal given hx of thyroid nodule  Goiter Patient is requesting referral to ENT Referral placed   Neuropathy Update B12 in setting of ongoing neuropathy, chronic medication use and mostly plant-based diet Follow-up with sports medicine/neuro for ongoing work-up   I,Havlyn C Ratchford,acting as a scribe for [employer], PA.,have documented all relevant documentation on the behalf of Taylor Coke, PA,as directed by  Taylor Coke, PA while in the presence of Taylor Berger, [place].  I, Taylor Berger, [place], have reviewed all documentation for this visit. The documentation on 02/20/21 for the exam, diagnosis, procedures, and orders are all accurate and complete.  Taylor Coke, PA-C

## 2021-02-20 NOTE — Patient Instructions (Signed)
It was great to see you!  We will update blood work today, I will be in touch with these results.  I will put in new referral for ENT.  If a referral was placed today, you will be contacted for an appointment. Please note that routine referrals can sometimes take up to 3-4 weeks to process. Please call our office if you haven't heard anything after this time frame.  Take care,  Inda Coke PA-C

## 2021-02-28 ENCOUNTER — Other Ambulatory Visit: Payer: Self-pay

## 2021-02-28 ENCOUNTER — Ambulatory Visit (INDEPENDENT_AMBULATORY_CARE_PROVIDER_SITE_OTHER): Payer: Medicare Other | Admitting: Neurology

## 2021-02-28 DIAGNOSIS — R202 Paresthesia of skin: Secondary | ICD-10-CM | POA: Diagnosis not present

## 2021-02-28 DIAGNOSIS — G5622 Lesion of ulnar nerve, left upper limb: Secondary | ICD-10-CM

## 2021-02-28 DIAGNOSIS — G5602 Carpal tunnel syndrome, left upper limb: Secondary | ICD-10-CM

## 2021-02-28 NOTE — Procedures (Signed)
[facility]  [place], [address]  [place], [place] [address] Tel: [phone] Fax:  [phone] Test Date:  02/28/2021  [name] DOB: 14-Feb-1946 Physician: Narda Amber, DO  Sex: Male Height: 5\' 8"  Ref Phys: Lynne Leader, M.D.  ID#: [id number]   Technician:    Patient Complaints: This is a 75 year old man with history of cervical fusion, left CTS release, and left ulnar transposition referred for evaluation of left hand weakness.  NCV & EMG Findings: Extensive electrodiagnostic testing of the left upper extremity shows:  Left median and ulnar sensory responses show prolonged latency (L4.3, L3.6 ms).  Left radial sensory response is within normal limits. Left median motor response shows prolonged latency (5.0 ms).  Left ulnar motor response shows prolonged latency (L4.6, 5.0 ms), reduced amplitude (L2.1, L1.8 mV), and decreased conduction velocity (A Elbow-B Elbow, L38, L42 m/s).   Severe chronic motor axonal loss changes are seen affecting the ulnar innervated muscles and to a lesser degree in the left abductor pollicis brevis muscle.  There is no evidence of accompanied active denervation.  Impression: Left ulnar neuropathy with slowing across the elbow, with demyelinating and axonal features, severe. Left median neuropathy at or distal to the wrist, consistent with a clinical diagnosis of carpal tunnel syndrome, moderate. There is no evidence of a cervical radiculopathy affecting the left upper extremity.   ___________________________ Narda Amber, DO    Nerve Conduction Studies Anti Sensory Summary Table   Stim Site NR Peak (ms) Norm Peak (ms) P-T Amp (V) Norm P-T Amp  Left Median Anti Sensory (2nd Digit)  32C  Wrist    4.3 <3.8 19.6 >10  Left Radial Anti Sensory (Base 1st Digit)  32C  Wrist    2.8 <2.8 16.8 >10  Left Ulnar Anti Sensory (5th Digit)  32C  Wrist    3.6 <3.2 6.7 >5   Motor Summary Table   Stim Site NR Onset (ms) Norm  Onset (ms) O-P Amp (mV) Norm O-P Amp Site1 Site2 Delta-0 (ms) Dist (cm) Vel (m/s) Norm Vel (m/s)  Left Median Motor (Abd Poll Brev)  32C  Wrist    5.0 <4.0 5.8 >5 Elbow Wrist 4.8 28.0 58 >50  Elbow    9.8  4.8         Left Ulnar Motor (Abd Dig Minimi)  32C  Wrist    4.6 <3.1 2.1 >7 B Elbow Wrist 4.0 22.0 55 >50  B Elbow    8.6  1.9  A Elbow B Elbow 2.6 10.0 38 >50  A Elbow    11.2  1.6         Left Ulnar (FDI) Motor (1st DI)  32C  Wrist    5.0 <4.5 1.8 >7 B Elbow Wrist 4.0 22.0 55 >50  B Elbow    9.0  1.1  A Elbow B Elbow 2.4 10.0 42 >50  A Elbow    11.4  1.1          EMG   Side Muscle Ins Act Fibs Psw Fasc Number Recrt Dur Dur. Amp Amp. Poly Poly. Comment  Left 1stDorInt Nml Nml Nml Nml SMU Rapid All 2+ All 2+ All 2+ ATR  Left Abd Poll Brev Nml Nml Nml Nml 2- Rapid Many 1+ Many 1+ Many 1+ N/A  Left Ext Indicis Nml Nml Nml Nml Nml Nml Nml Nml Nml Nml Nml Nml N/A  Left PronatorTeres Nml Nml Nml Nml Nml Nml Nml Nml Nml Nml Nml Nml N/A  Left Biceps Nml Nml Nml Nml Nml Nml Nml Nml Nml Nml Nml Nml N/A  Left Triceps Nml Nml Nml Nml Nml Nml Nml Nml Nml Nml Nml Nml N/A  Left Deltoid Nml Nml Nml Nml Nml Nml Nml Nml Nml Nml Nml Nml N/A  Left ABD Dig Min Nml Nml Nml Nml SMU Rapid All 2+ All 2+ All 2+ ATR  Left FlexCarpiUln Nml Nml Nml Nml 2- Rapid Some 1+ Some 1+ Some 1+ N/A      Waveforms:

## 2021-03-01 NOTE — Progress Notes (Signed)
Nerve conduction study shows severe cubital tunnel syndrome and medium carpal tunnel syndrome.  It does not indicate that your left arm weakness is due to a pinched nerve in your neck.  Recommend return to clinic to go over these results in full detail.

## 2021-03-02 ENCOUNTER — Other Ambulatory Visit: Payer: Self-pay | Admitting: Family Medicine

## 2021-03-02 ENCOUNTER — Telehealth: Payer: Self-pay

## 2021-03-02 NOTE — Telephone Encounter (Signed)
Patient called in and was wanting to f/u on his referral for endocrinology. Didn't see a referral he would like one sent in. Please advise.

## 2021-03-03 ENCOUNTER — Other Ambulatory Visit: Payer: Self-pay

## 2021-03-03 NOTE — Telephone Encounter (Signed)
LVM for patient to return call. 

## 2021-03-03 NOTE — Telephone Encounter (Signed)
Okay to place referral

## 2021-03-06 ENCOUNTER — Encounter: Payer: Self-pay | Admitting: Physician Assistant

## 2021-03-06 NOTE — Telephone Encounter (Signed)
Pt returned call

## 2021-03-06 NOTE — Progress Notes (Signed)
I, Taylor Berger, LAT, ATC, am serving as scribe for Dr. Lynne Leader.  Taylor Berger is a 75 y.o. male who presents to [place] at [facility] today for f/u of B hand pain and weakness, L>R, and to review his UE NCV/EMG.  He has a hx of a cervical fusion (C3-7) in 2010 and of carpal tunnel syndrome.  Also has a hx of L carapl tunnel release and a L ulnar nerve transposition about 5-6 years ago.  He was last seen by Dr. Georgina Snell on 11/29/20 and was referred to neurology and to hand therapy at [facility].  He saw Dr. Posey Pronto on 02/28/21 and  had an UE NCV/EMG.  Today, pt reports went 1 time to PT and then decided to hold off pending the results of the testing. Pt reports bilat hands are about the same w/ decreased grip strength, L>R. Pt c/o fingers triggering intermittently on bilat hand, R>L  Diagnostic testing: UE NCV/EMG- 02/28/21; c-spine XR- 11/29/20  Pertinent review of systems: No fevers or chills  Relevant historical information: Low-grade follicular lymphoma.  Neuropathy bilateral feet   Exam:  BP 128/82   Pulse (!) 51   Ht 5\' 8"  (1.727 m)   Wt 213 lb (96.6 kg)   SpO2 98%   BMI 32.39 kg/m  General: Well Developed, well nourished, and in no acute distress.   MSK: Left hand significant atrophy hand musculature.  Decreased hand motion.    Lab and Radiology Results  EMG/NCV 02/28/2021  Patient Complaints: This is a 75 year old man with history of cervical fusion, left CTS release, and left ulnar transposition referred for evaluation of left hand weakness.   NCV & EMG Findings: Extensive electrodiagnostic testing of the left upper extremity shows:  Left median and ulnar sensory responses show prolonged latency (L4.3, L3.6 ms).  Left radial sensory response is within normal limits. Left median motor response shows prolonged latency (5.0 ms).  Left ulnar motor response shows prolonged latency (L4.6, 5.0 ms), reduced amplitude (L2.1, L1.8 mV), and decreased  conduction velocity (A Elbow-B Elbow, L38, L42 m/s).   Severe chronic motor axonal loss changes are seen affecting the ulnar innervated muscles and to a lesser degree in the left abductor pollicis brevis muscle.  There is no evidence of accompanied active denervation.   Impression: Left ulnar neuropathy with slowing across the elbow, with demyelinating and axonal features, severe. Left median neuropathy at or distal to the wrist, consistent with a clinical diagnosis of carpal tunnel syndrome, moderate. There is no evidence of a cervical radiculopathy affecting the left upper extremity.     ___________________________ Narda Amber, DO      Assessment and Plan: 75 y.o. male with left hand weakness and paresthesias due to significant nerve injury to the ulnar nerve at the elbow and median nerve at the wrist.  Patient is already had an ulnar nerve transposition and carpal tunnel release years ago.  Discussed treatment plan and options.  He may benefit from a carpal tunnel injection but is unclear.  Doubtful that any injection would help his ulnar nerve.  Discussed options.  Plan for trial of dedicated hand PT and recheck in 8 weeks.  Would consider injection at that time.  Additionally may benefit from second opinion hand surgery referral.   PDMP not reviewed this encounter. Orders Placed This Encounter  Procedures   [place] LIMITED JOINT SPACE STRUCTURES UP BILAT(NO LINKED CHARGES)    Standing Status:   Future    Number of  Occurrences:   1    Standing Expiration Date:   09/05/2021    Order Specific Question:   Reason for Exam (SYMPTOM  OR DIAGNOSIS REQUIRED)    Answer:   bilateral hand pain    Order Specific Question:   Preferred imaging location?    Answer:   [place]   Ambulatory referral to Physical Therapy    Referral Priority:   Routine    Referral Type:   Physical Medicine    Referral Reason:   Specialty Services Required    Requested Specialty:   Physical  Therapy    Number of Visits Requested:   1   No orders of the defined types were placed in this encounter.    Discussed warning signs or symptoms. Please see discharge instructions. Patient expresses understanding.   The above documentation has been reviewed and is accurate and complete Lynne Leader, M.D.  Total encounter time 20 minutes including face-to-face time with the patient and, reviewing past medical record, and charting on the date of service.   Discussed treatment plan and options as well as nerve conduction study results

## 2021-03-07 ENCOUNTER — Ambulatory Visit: Payer: Self-pay

## 2021-03-07 ENCOUNTER — Other Ambulatory Visit: Payer: Self-pay | Admitting: *Deleted

## 2021-03-07 ENCOUNTER — Telehealth: Payer: Self-pay

## 2021-03-07 ENCOUNTER — Other Ambulatory Visit: Payer: Self-pay

## 2021-03-07 ENCOUNTER — Ambulatory Visit (INDEPENDENT_AMBULATORY_CARE_PROVIDER_SITE_OTHER): Payer: Medicare Other | Admitting: Family Medicine

## 2021-03-07 VITALS — BP 128/82 | HR 51 | Ht 68.0 in | Wt 213.0 lb

## 2021-03-07 DIAGNOSIS — I2510 Atherosclerotic heart disease of native coronary artery without angina pectoris: Secondary | ICD-10-CM | POA: Diagnosis not present

## 2021-03-07 DIAGNOSIS — R29898 Other symptoms and signs involving the musculoskeletal system: Secondary | ICD-10-CM

## 2021-03-07 DIAGNOSIS — M79642 Pain in left hand: Secondary | ICD-10-CM | POA: Diagnosis not present

## 2021-03-07 DIAGNOSIS — G5622 Lesion of ulnar nerve, left upper limb: Secondary | ICD-10-CM | POA: Diagnosis not present

## 2021-03-07 DIAGNOSIS — G5602 Carpal tunnel syndrome, left upper limb: Secondary | ICD-10-CM

## 2021-03-07 DIAGNOSIS — M79641 Pain in right hand: Secondary | ICD-10-CM

## 2021-03-07 DIAGNOSIS — E049 Nontoxic goiter, unspecified: Secondary | ICD-10-CM

## 2021-03-07 NOTE — Telephone Encounter (Signed)
Returned call to patient and he stated that he was calling to check on endo referral because he hadn't heard anything since his appointment. Referral placed for endocrinology.

## 2021-03-07 NOTE — Telephone Encounter (Signed)
Patients wife will have him call the office back

## 2021-03-07 NOTE — Telephone Encounter (Signed)
Patient is calling in stating that he was returning a call about lab results.

## 2021-03-07 NOTE — Patient Instructions (Addendum)
Thank you for coming in today.   I've referred you to Physical Therapy.  Let us know if you don't hear from them in one week.   Recheck back in 8 weeks.

## 2021-03-07 NOTE — Telephone Encounter (Signed)
Is this supposed to be an ENT referral? I do not see a diagnosis for Endo.

## 2021-03-16 ENCOUNTER — Other Ambulatory Visit: Payer: Self-pay

## 2021-03-16 ENCOUNTER — Encounter: Payer: Self-pay | Admitting: Family Medicine

## 2021-03-16 DIAGNOSIS — G5622 Lesion of ulnar nerve, left upper limb: Secondary | ICD-10-CM

## 2021-03-30 ENCOUNTER — Encounter: Payer: Self-pay | Admitting: Physician Assistant

## 2021-03-31 ENCOUNTER — Other Ambulatory Visit: Payer: Self-pay

## 2021-03-31 DIAGNOSIS — H269 Unspecified cataract: Secondary | ICD-10-CM

## 2021-04-04 ENCOUNTER — Telehealth: Payer: Self-pay

## 2021-04-04 NOTE — Telephone Encounter (Signed)
LVM for patient to call back and schedule PT.

## 2021-04-07 ENCOUNTER — Other Ambulatory Visit: Payer: Self-pay

## 2021-04-07 ENCOUNTER — Ambulatory Visit (INDEPENDENT_AMBULATORY_CARE_PROVIDER_SITE_OTHER): Payer: Medicare Other | Admitting: Endocrinology

## 2021-04-07 VITALS — BP 140/80 | HR 70 | Ht 68.0 in | Wt 209.0 lb

## 2021-04-07 DIAGNOSIS — E049 Nontoxic goiter, unspecified: Secondary | ICD-10-CM

## 2021-04-07 NOTE — Progress Notes (Signed)
Subjective:    Patient ID: Taylor Berger, male    DOB: 1945/12/17, 76 y.o.   MRN: [id number]  HPI Pt is referred by Inda Coke, PA, for nodular thyroid.  Pt was noted to have a thyroid nodule in 2015, when he lived in [place].  he has no h/o XRT to the neck.  He then had serial [place] since dx, in [place].   Past Medical History:  Diagnosis Date   Allergy    Seasonal   Aortic aneurysm (HCC)    Depression    DVT (deep venous thrombosis) ([place]) 2020   GERD (gastroesophageal reflux disease)    Goiter    Hyperlipidemia    Hypertension    Lymphoma ([place]) 9892   Follicular, stomach   Neuropathy of both feet     Past Surgical History:  Procedure Laterality Date   CARPAL TUNNEL RELEASE Left    CERVICAL DISCECTOMY  1995   C-7   CERVICAL FUSION  01/1989   C-3 through C-6 / no metal   COLONOSCOPY     ELBOW SURGERY Left    FOOT SURGERY Left 1999   Plantar fascitis   low back fusion  2013   Lumbar & Low Thoracic with metal   SHOULDER ARTHROSCOPY WITH ROTATOR CUFF REPAIR Right 2014   UPPER GASTROINTESTINAL ENDOSCOPY      Social History   Socioeconomic History   Marital status: Married    Spouse name: Not on file   Number of children: Not on file   Years of education: Not on file   Highest education level: Not on file  Occupational History   Occupation: retired   Tobacco Use   Smoking status: Never   Smokeless tobacco: Never  Vaping Use   Vaping Use: Never used  Substance and Sexual Activity   Alcohol use: Not Currently   Drug use: Never   Sexual activity: Not Currently  Other Topics Concern   Not on file  Social History Narrative   ** Merged History Encounter **       Moved from [place] to be closer to son Married   Social Determinants of Radio broadcast assistant Strain: Low Risk    Difficulty of Paying Living Expenses: Not hard at all  Food Insecurity: No Food Insecurity   Worried About Charity fundraiser in the Last Year: Never true   Arboriculturist in the Last Year:  Never true  Transportation Needs: No Transportation Needs   Lack of Transportation (Medical): No   Lack of Transportation (Non-Medical): No  Physical Activity: Inactive   Days of Exercise per Week: 0 days   Minutes of Exercise per Session: 0 min  Stress: No Stress Concern Present   Feeling of Stress : Not at all  Social Connections: Moderately Isolated   Frequency of Communication with Friends and Family: Three times a week   Frequency of Social Gatherings with Friends and Family: Once a week   Attends Religious Services: Never   Marine scientist or Organizations: No   Attends Music therapist: Never   Marital Status: Married  Human resources officer Violence: Not At Risk   Fear of Current or Ex-Partner: No   Emotionally Abused: No   Physically Abused: No   Sexually Abused: No    Current Outpatient Medications on File Prior to Visit  Medication Sig Dispense Refill   amLODipine (NORVASC) 10 MG tablet Take 1 tablet by mouth once daily 90 tablet 0  atorvastatin (LIPITOR) 40 MG tablet Take 1 tablet by mouth once daily 90 tablet 0   cetirizine (ZYRTEC) 10 MG tablet Take 10 mg by mouth daily.     clindamycin (CLEOCIN-T) 1 % external solution Apply topically 2 (two) times daily. (Patient taking differently: Apply topically as needed.) 30 mL 0   esomeprazole (NEXIUM) 20 MG capsule Take 20 mg by mouth daily at 12 noon.     fluticasone (FLONASE) 50 MCG/ACT nasal spray Place into both nostrils as needed for allergies or rhinitis.     lisinopril (ZESTRIL) 5 MG tablet Take 1 tablet by mouth once daily 90 tablet 0   rivaroxaban (XARELTO) 10 MG TABS tablet Take 1 tablet (10 mg total) by mouth daily. 90 tablet 2   valACYclovir (VALTREX) 500 MG tablet Take 1 tablet (500 mg total) by mouth daily. 90 tablet 3   No current facility-administered medications on file prior to visit.    No Known Allergies  Family History  Problem Relation Age of Onset   Diabetes Mother    Hypertension  Mother    Hyperlipidemia Mother    Heart failure Mother    Leukemia Father    Stroke Father    Esophageal cancer Neg Hx    Colon polyps Neg Hx    Pancreatic cancer Neg Hx    Stomach cancer Neg Hx    Rectal cancer Neg Hx     BP 140/80    Pulse 70    Ht 5\' 8"  (1.727 m)    Wt 209 lb (94.8 kg)    SpO2 95%    BMI 31.78 kg/m     Review of Systems Denies hoarseness, neck pain, or sob.       Objective:   Physical Exam VITAL SIGNS:  See vs page GENERAL: no distress NECK: Neck: a healed scar is present (C-spine procedure).  I do not appreciate a nodule in the thyroid or elsewhere in the neck.     Lab Results  Component Value Date   TSH 3.38 02/20/2021   [place] (2022) Nodule 2 (TI-RADS 5) 1.9 x 1.6 x 1.5 cm, located in the mid right thyroid lobe.    Bx (9833) benign follicular nodule (cat 2)    Assessment & Plan:  MNG, uncertain etiology and prognosis.  Pt signs release of info from [place].  No medication is needed for this.  Patient Instructions  We'll call you when we receive records from [facility].  It may be that no further testing is needed for this.

## 2021-04-07 NOTE — Patient Instructions (Signed)
We'll call you when we receive records from [facility].  It may be that no further testing is needed for this.

## 2021-04-24 ENCOUNTER — Ambulatory Visit (INDEPENDENT_AMBULATORY_CARE_PROVIDER_SITE_OTHER): Payer: Medicare Other | Admitting: Physical Therapy

## 2021-04-24 ENCOUNTER — Encounter: Payer: Self-pay | Admitting: Physical Therapy

## 2021-04-24 DIAGNOSIS — M6281 Muscle weakness (generalized): Secondary | ICD-10-CM | POA: Diagnosis not present

## 2021-04-24 DIAGNOSIS — M542 Cervicalgia: Secondary | ICD-10-CM | POA: Diagnosis not present

## 2021-04-24 NOTE — Therapy (Signed)
OUTPATIENT PHYSICAL THERAPY CERVICAL EVALUATION   Patient Name: Taylor Berger MRN: [id number] DOB:1945-04-14, 76 y.o., male Today's Date: 04/24/2021   PT End of Session - 04/24/21 0956     Visit Number 1    Number of Visits 12    Date for PT Re-Evaluation 06/19/21    Authorization Type medicare    Progress Note Due on Visit 0    PT Start Time 1009    PT Stop Time 5009    PT Time Calculation (min) 38 min    Activity Tolerance Patient tolerated treatment well             Past Medical History:  Diagnosis Date   Allergy    Seasonal   Aortic aneurysm ([place])    Depression    DVT (deep venous thrombosis) ([place]) 2020   GERD (gastroesophageal reflux disease)    Goiter    Hyperlipidemia    Hypertension    Lymphoma ([place]) 3818   Follicular, stomach   Neuropathy of both feet    Past Surgical History:  Procedure Laterality Date   CARPAL TUNNEL RELEASE Left    CERVICAL DISCECTOMY  1995   C-7   CERVICAL FUSION  01/1989   C-3 through C-6 / no metal   COLONOSCOPY     ELBOW SURGERY Left    FOOT SURGERY Left 1999   Plantar fascitis   low back fusion  2013   Lumbar & Low Thoracic with metal   SHOULDER ARTHROSCOPY WITH ROTATOR CUFF REPAIR Right 2014   UPPER GASTROINTESTINAL ENDOSCOPY     Patient Active Problem List   Diagnosis Date Noted   Follicular lymphoma grade I of intra-abdominal lymph nodes ([place]) 03/07/2020   Hypertension 01/29/2020   Thoracic aortic aneurysm 01/29/2020   DVT R femoral thigh 01/29/2020   GERD (gastroesophageal reflux disease) 01/29/2020   History of Barrett's esophagus 01/29/2020   Goiter 01/29/2020   Neuropathy of both feet    Lymphoma ([place]) 2020    PCP: Inda Coke, PA  REFERRING PROVIDER: Gregor Hams, MD  REFERRING DIAG: G56.22 (ICD-10-CM) - Cubital tunnel syndrome on left  THERAPY DIAG:  Cervicalgia  Muscle weakness (generalized)  ONSET DATE: increased over the last year  SUBJECTIVE:                                                                                                                                                                                                          SUBJECTIVE STATEMENT: Patient has been having bilateral hand weakness. [facility]  [facility] but wanted to transition to a clinic closer to home. Reports that he has no grip strength in his left hand and this is new to him. Reports that he has a history of both hands locking up on him and it happens randomly. Symptoms occur bilaterally with L>R and he is R handed. States that he has chronic symptoms in neck and he has headaches. Reports no numbness in hands just weakness. Reports no real pain in his head. States that typing is also difficulty the left. Reports the pinky finger is the worse  PERTINENT HISTORY:  hx of L carapl tunnel release and a L ulnar nerve transposition about 5-6 years ago, cervial fusion with metal with last one 2011 (total of 3 neck surgeries), bilateral foot neuropathy, vertig, RCR R > 5 years ago   PAIN:  Are you having pain? Yes NPRS scale: 1/10 Pain location: neck on right  Pain orientation: Right  PAIN TYPE: aching and tight Pain description: intermittent  Aggravating factors: sleeping in weird position Relieving factors: heat  PRECAUTIONS: None  WEIGHT BEARING RESTRICTIONS No  FALLS:  Has patient fallen in last 6 months? 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 Postsurgical straightening of normal lordosis with anterior fusion C3 through C7. No hardware complication. 2. Multilevel facet hypertrophy. Bony neural foraminal stenosis appears more prominent on the left, at C4-C5, C5-C6, and C6-C7.      COGNITION: Overall cognitive status: Within functional limits for tasks assessed     POSTURE:   rounded shoulders, forward head, increased thoracic kyphosis, reduced lumbar lordosis  PALPATION:  Tenderness to palpation along bilateral traps, thoracic paraspinals and biceps R >L  CERVICAL AROM/PROM  A/PROM A/PROM (deg) 04/24/2021  Flexion 12  Extension 18  Right lateral flexion 14*  Left lateral flexion 8*  Right rotation 35*  Left rotation 42   (Blank rows = not tested) *pain on contralateral side   UE Measurements Upper Extremity Right 04/24/2021 Left 04/24/2021   A/PROM MMT A/PROM MMT  Shoulder Flexion 152 4+ 155 4*  Shoulder Extension      Shoulder Abduction    4*  Shoulder Adduction      Shoulder Internal Rotation Reaches to L4 SP */30  *right scapular inferior border /60 4*  Shoulder External Rotation right scapular superior border  Reaches to T2 SP  4*  Elbow Flexion    4+  Elbow Extension    4+  Wrist Flexion 45 4+ 45* 4-  Wrist Extension 65* 4+ 55 3+  Wrist Supination 75 4+ 62 3+  Wrist Pronation  4+  3+  Wrist Ulnar Deviation      Wrist Radial Deviation      Grip Strength NA 60# NA 35#    (Blank rows = not tested) *pain  CERVICAL SPECIAL TESTS:  Nerve tests negative bilaterally (median/radial and ulnar) Negative distraction and Spurling test    TODAY'S TREATMENT:  04/24/21 Therapeutic Exercise:  Aerobic: Supine: Prone:  Seated: supination stretch with 4# x10 Left, wrist stretch flexion and extension x5 10" holds Left, grip in neutral/supination/prontation with towel x5 " holds left  Standing:    PATIENT EDUCATION:  Education details: on current presentation, on HEP, on anatomy, posture and POC Person educated: Patient Education method: Explanation, Demonstration, and Handouts Education comprehension: verbalized understanding   HOME EXERCISE PROGRAM: [phone]  ASSESSMENT:  CLINICAL IMPRESSION: Patient is a 76 y.o. male who was seen today for physical therapy evaluation and treatment for bilateral hand weakness. Objective impairments  include decreased activity tolerance, decreased ROM, decreased strength, impaired UE functional use, and pain. These impairments are limiting patient from cleaning and community activity. Personal factors including Age, Time since onset of injury/illness/exacerbation, 1 comorbidity: x3 cervical surgeries, 1-2 comorbidities: x2 wrist/elbow surgeries, and 3+ comorbidities: lumbar fusion  are also affecting patient's functional outcome. Patient will benefit from skilled PT to address above impairments and improve overall function.  Patient presents to therapy with reduced strength and mobility in left hand that has reduced his overall function during the day. No peripheral nerve involvement. Limitations in left shoulder and neck noted during examination. Educated patient on current condition and plan moving forward. Patient would greatly benefit from skilled PT to improve overall function of left UE.  REHAB POTENTIAL: Good  CLINICAL DECISION MAKING: Stable/uncomplicated  EVALUATION COMPLEXITY: Low   GOALS: Goals reviewed with patient? Yes  SHORT TERM GOALS:  STG Name Target Date Goal status  1 Patient will be independent in self management strategies to improve quality of life and functional outcomes. Baseline:  05/22/2021  INITIAL  2 Patient will report at least 50% improvement in overall symptoms and/or function to demonstrate improved functional mobility Baseline: 0% 05/22/2021  INITIAL  3 Patient will be able to demonstrate at least 50# of left grip strength Baseline: 05/22/2021  INITIAL  4  Baseline:    5  Baseline:    6  Baseline:    7  Baseline:     LONG TERM GOALS:   LTG Name Target Date Goal status  1 Patient will report at least 75% improvement in overall symptoms and/or function to demonstrate improved functional mobility Baseline:0% 06/19/2021  INITIAL  2 Patient will demonstrate improved left UE strength with at least 4/5 MMT strength Baseline: 06/19/2021  INITIAL  3  Patient will report being able to type on keyboard with improved dexterity without hitting wrong keys because of left pinky finger Baseline: 06/19/2021  INITIAL  4  Baseline:    5  Baseline:    6  Baseline:    7  Baseline:       PLAN: PT FREQUENCY: 1-2x/week for total of 12 visits  PT DURATION: 8 weeks  PLANNED INTERVENTIONS: Therapeutic exercises, Therapeutic activity, Neuro Muscular re-education, Balance training, Gait training, Patient/Family education, Joint mobilization, Dry Needling, Electrical stimulation, Cryotherapy, Moist heat, and Manual therapy  PLAN FOR NEXT SESSION: shoulder strengthening, grip and wrist strength and mobility  10:55 AM, 04/24/21 Jerene Pitch, DPT Physical Therapy with [facility]  [phone] office

## 2021-05-01 ENCOUNTER — Other Ambulatory Visit: Payer: Self-pay

## 2021-05-01 ENCOUNTER — Encounter: Payer: Self-pay | Admitting: Physician Assistant

## 2021-05-01 ENCOUNTER — Ambulatory Visit (INDEPENDENT_AMBULATORY_CARE_PROVIDER_SITE_OTHER): Payer: Medicare Other | Admitting: Physician Assistant

## 2021-05-01 VITALS — BP 124/80 | HR 71 | Temp 98.2°F | Ht 68.0 in | Wt 208.2 lb

## 2021-05-01 DIAGNOSIS — R519 Headache, unspecified: Secondary | ICD-10-CM | POA: Diagnosis not present

## 2021-05-01 MED ORDER — VALACYCLOVIR HCL 1 G PO TABS: 1000.0000 mg | ORAL_TABLET | Freq: Three times a day (TID) | ORAL | 0 refills | Status: AC

## 2021-05-01 NOTE — Progress Notes (Signed)
Taylor Berger is a 76 y.o. male here for neck pain.  History of Present Illness:   Chief Complaint  Patient presents with   Neck Pain    Pt has been having stiff neck right side x 2-3 months off and on. Pt said he was combing his hair and top of head on right side towards the back is very tender to touch.    HPI  Scalp Tenderness States that upon combing his hair a couple of days prior, he noticed that the top of head on right side towards the back was tender upon palpation to the area. Due to this he decided, it was time to have this further evaluated. Currently states the tenderness has slightly improved. After further discussion, it was found that pt has been compliant with taking valtrex 500 mg daily with no complications due to hx of HSV infection.  Pt is UTD with shingles vaccination. Denies lesions, vision changes, itchiness, or redness.   Past Medical History:  Diagnosis Date   Allergy    Seasonal   Aortic aneurysm (HCC)    Depression    DVT (deep venous thrombosis) ([place]) 2020   GERD (gastroesophageal reflux disease)    Goiter    Hyperlipidemia    Hypertension    Lymphoma ([place]) 4580   Follicular, stomach   Neuropathy of both feet      Social History   Tobacco Use   Smoking status: Never   Smokeless tobacco: Never  Vaping Use   Vaping Use: Never used  Substance Use Topics   Alcohol use: Not Currently   Drug use: Never    Past Surgical History:  Procedure Laterality Date   CARPAL TUNNEL RELEASE Left    CERVICAL DISCECTOMY  1995   C-7   CERVICAL FUSION  01/1989   C-3 through C-6 / no metal   COLONOSCOPY     ELBOW SURGERY Left    FOOT SURGERY Left 1999   Plantar fascitis   low back fusion  2013   Lumbar & Low Thoracic with metal   SHOULDER ARTHROSCOPY WITH ROTATOR CUFF REPAIR Right 2014   UPPER GASTROINTESTINAL ENDOSCOPY      Family History  Problem Relation Age of Onset   Diabetes Mother    Hypertension Mother    Hyperlipidemia Mother    Heart  failure Mother    Leukemia Father    Stroke Father    Esophageal cancer Neg Hx    Colon polyps Neg Hx    Pancreatic cancer Neg Hx    Stomach cancer Neg Hx    Rectal cancer Neg Hx     No Known Allergies  Current Medications:   Current Outpatient Medications:    amLODipine (NORVASC) 10 MG tablet, Take 1 tablet by mouth once daily, Disp: 90 tablet, Rfl: 0   atorvastatin (LIPITOR) 40 MG tablet, Take 1 tablet by mouth once daily, Disp: 90 tablet, Rfl: 0   cetirizine (ZYRTEC) 10 MG tablet, Take 10 mg by mouth daily., Disp: , Rfl:    clindamycin (CLEOCIN-T) 1 % external solution, Apply topically 2 (two) times daily. (Patient taking differently: Apply topically as needed.), Disp: 30 mL, Rfl: 0   esomeprazole (NEXIUM) 20 MG capsule, Take 20 mg by mouth daily at 12 noon., Disp: , Rfl:    fluticasone (FLONASE) 50 MCG/ACT nasal spray, Place into both nostrils as needed for allergies or rhinitis., Disp: , Rfl:    lisinopril (ZESTRIL) 5 MG tablet, Take 1 tablet by mouth once daily,  Disp: 90 tablet, Rfl: 0   rivaroxaban (XARELTO) 10 MG TABS tablet, Take 1 tablet (10 mg total) by mouth daily. (Patient taking differently: Take 20 mg by mouth daily.), Disp: 90 tablet, Rfl: 2   valACYclovir (VALTREX) 500 MG tablet, Take 1 tablet (500 mg total) by mouth daily., Disp: 90 tablet, Rfl: 3   Review of Systems:   ROS Negative unless otherwise specified per HPI. Vitals:   Vitals:   05/01/21 1033  BP: 124/80  Pulse: 71  Temp: 98.2 F (36.8 C)  TempSrc: Temporal  SpO2: 94%  Weight: 208 lb 4 oz (94.5 kg)  Height: 5\' 8"  (1.727 m)     Body mass index is 31.66 kg/m.  Physical Exam:   Physical Exam Vitals and nursing note reviewed.  Constitutional:      General: He is not in acute distress.    Appearance: He is well-developed. He is not ill-appearing or toxic-appearing.  HENT:     Head:     Comments: Slight tenderness to right side of scalp without visible lesions or erythema  Cardiovascular:      Rate and Rhythm: Normal rate and regular rhythm.     Pulses: Normal pulses.     Heart sounds: Normal heart sounds, S1 normal and S2 normal.  Pulmonary:     Effort: Pulmonary effort is normal.     Breath sounds: Normal breath sounds.  Skin:    General: Skin is warm and dry.  Neurological:     Mental Status: He is alert.     GCS: GCS eye subscore is 4. GCS verbal subscore is 5. GCS motor subscore is 6.  Psychiatric:        Speech: Speech normal.        Behavior: Behavior normal. Behavior is cooperative.    Assessment and Plan:   Scalp tenderness No red flags on exam Suspect possible shingles; start Valtrex 1000 mg three times daily x 7 days  Hold current valtrex 500 mg daily Follow up if new/worsening symptoms or concerns occur  Discussed warning precautions - if lesions spread to face/eye -- needs urgent f/u with eye doctor  Nehemiah Massed C Ratchford,acting as a scribe for [employer], PA.,have documented all relevant documentation on the behalf of Inda Coke, PA,as directed by  Inda Coke, PA while in the presence of Inda Coke, [place].  I, Inda Coke, [place], have reviewed all documentation for this visit. The documentation on 05/01/21 for the exam, diagnosis, procedures, and orders are all accurate and complete.   Inda Coke, PA-C

## 2021-05-01 NOTE — Patient Instructions (Signed)
It was great to see you!  Hold your current 500 mg valtrex  Start the 1000 mg valtrex three times daily x 1 week  If new/worsening symptoms, let me know!  Take care,  Inda Coke PA-C

## 2021-05-02 ENCOUNTER — Ambulatory Visit: Payer: Medicare Other | Admitting: Family Medicine

## 2021-05-03 ENCOUNTER — Other Ambulatory Visit: Payer: Self-pay | Admitting: Family Medicine

## 2021-05-03 ENCOUNTER — Telehealth: Payer: Self-pay | Admitting: Physical Therapy

## 2021-05-03 ENCOUNTER — Encounter: Payer: Medicare Other | Admitting: Physical Therapy

## 2021-05-03 NOTE — Telephone Encounter (Signed)
No Show. Called and left VM about missed apt and about calling to schedule PT a there are currently no future PT apts scheduled.   10:39 AM, 05/03/21 Jerene Pitch, DPT Physical Therapy with [facility]  [phone] office

## 2021-05-08 ENCOUNTER — Ambulatory Visit (INDEPENDENT_AMBULATORY_CARE_PROVIDER_SITE_OTHER): Payer: Medicare Other | Admitting: Physical Therapy

## 2021-05-08 ENCOUNTER — Other Ambulatory Visit: Payer: Self-pay

## 2021-05-08 ENCOUNTER — Encounter: Payer: Self-pay | Admitting: Physical Therapy

## 2021-05-08 DIAGNOSIS — M6281 Muscle weakness (generalized): Secondary | ICD-10-CM | POA: Diagnosis not present

## 2021-05-08 DIAGNOSIS — M542 Cervicalgia: Secondary | ICD-10-CM | POA: Diagnosis not present

## 2021-05-08 NOTE — Therapy (Signed)
OUTPATIENT PHYSICAL THERAPY TREATMENT NOTE   Patient Name: Taylor Berger MRN: [id number] DOB:16-Apr-1945, 76 y.o., male Today's Date: 05/08/2021  PCP: Inda Coke, PA REFERRING PROVIDER: Inda Coke, PA   PT End of Session - 05/08/21 1014     Visit Number 2    Number of Visits 12    Date for PT Re-Evaluation 06/19/21    Authorization Type medicare    Progress Note Due on Visit 0    PT Start Time 0932    PT Stop Time 1056    PT Time Calculation (min) 40 min    Activity Tolerance Patient tolerated treatment well             Past Medical History:  Diagnosis Date   Allergy    Seasonal   Aortic aneurysm ([place])    Depression    DVT (deep venous thrombosis) ([place]) 2020   GERD (gastroesophageal reflux disease)    Goiter    Hyperlipidemia    Hypertension    Lymphoma ([place]) [id number]   Follicular, stomach   Neuropathy of both feet    Past Surgical History:  Procedure Laterality Date   CARPAL TUNNEL RELEASE Left    CERVICAL DISCECTOMY  1995   C-7   CERVICAL FUSION  01/1989   C-3 through C-6 / no metal   COLONOSCOPY     ELBOW SURGERY Left    FOOT SURGERY Left 1999   Plantar fascitis   low back fusion  2013   Lumbar & Low Thoracic with metal   SHOULDER ARTHROSCOPY WITH ROTATOR CUFF REPAIR Right 2014   UPPER GASTROINTESTINAL ENDOSCOPY     Patient Active Problem List   Diagnosis Date Noted   Follicular lymphoma grade I of intra-abdominal lymph nodes ([place]) 03/07/2020   Hypertension 01/29/2020   Thoracic aortic aneurysm 01/29/2020   DVT R femoral thigh 01/29/2020   GERD (gastroesophageal reflux disease) 01/29/2020   History of Barrett's esophagus 01/29/2020   Goiter 01/29/2020   Neuropathy of both feet    Lymphoma ([place]) 2020   PCP: Inda Coke, PA   REFERRING PROVIDER: Gregor Hams, MD   REFERRING DIAG: G56.22 (ICD-10-CM) - Cubital tunnel syndrome on left   THERAPY DIAG:  Cervicalgia   Muscle weakness (generalized)   ONSET DATE: increased over the  last year   SUBJECTIVE:                                                                                                                                                                                                          SUBJECTIVE  STATEMENT: States that he was doing his hammer exercise and was having pain but he used a heavy hammer   PERTINENT HISTORY:  hx of L carapl tunnel release and a L ulnar nerve transposition about 5-6 years ago, cervial fusion with metal with last one 2011 (total of 3 neck surgeries), bilateral foot neuropathy, vertig, RCR R > 5 years ago     PAIN:  Are you having pain? Yes NPRS scale: 1/10 Pain location: left hand  Pain orientation: Right  PAIN TYPE: aching and tight Pain description: intermittent  Aggravating factors: sleeping in weird position Relieving factors: heat   PRECAUTIONS: None   WEIGHT BEARING RESTRICTIONS No   FALLS:  Has patient fallen in last 6 months? 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 Postsurgical straightening of normal lordosis with anterior fusion C3 through C7. No hardware complication. 2. Multilevel facet hypertrophy. Bony neural foraminal stenosis appears more prominent on the left, at C4-C5, C5-C6, and C6-C7.         COGNITION: Overall cognitive status: Within functional limits for tasks assessed                POSTURE:  rounded shoulders, forward head, increased thoracic kyphosis, reduced lumbar lordosis   PALPATION:           Tenderness to palpation along bilateral traps, thoracic paraspinals and biceps R >L   CERVICAL AROM/PROM   A/PROM A/PROM (deg) 04/24/2021  Flexion 12  Extension 18  Right lateral flexion 14*  Left lateral flexion 8*  Right rotation 35*  Left rotation 42   (Blank rows = not tested) *pain on  contralateral side    UE Measurements       Upper Extremity Right 04/24/2021 Left 04/24/2021    A/PROM MMT A/PROM MMT  Shoulder Flexion 152 4+ 155 4*  Shoulder Extension          Shoulder Abduction       4*  Shoulder Adduction          Shoulder Internal Rotation Reaches to L4 SP */30   *right scapular inferior border /60 4*  Shoulder External Rotation right scapular superior border   Reaches to T2 SP  4*  Elbow Flexion       4+  Elbow Extension       4+  Wrist Flexion 45 4+ 45* 4-  Wrist Extension 65* 4+ 55 3+  Wrist Supination 75 4+ 62 3+  Wrist Pronation   4+   3+  Wrist Ulnar Deviation          Wrist Radial Deviation          Grip Strength NA 60# NA 35#                      (Blank rows = not tested) *pain   CERVICAL SPECIAL TESTS:  Nerve tests negative bilaterally (median/radial and ulnar) Negative distraction and Spurling test       TODAY'S TREATMENT:   05/08/2021 Therapeutic Exercise:           seated: wrist extension x25 B 2#, wrist flexion x25 bilat 2#, radial deviation x25 2# bilat: prayer stretch x5 15" holds, reverse prayer x5 10" holds, self mobilization with tennis ball - 10 minutes to left UE  04/24/21 Therapeutic Exercise:           Aerobic: Supine: Prone:           Seated: supination stretch with 4# x10 Left, wrist stretch flexion and extension x5 10" holds Left, grip in neutral/supination/prontation with towel x5 " holds left           Standing:       PATIENT EDUCATION:  Education details: on current presentation, on HEP, on anatomy, posture and POC Person educated: Patient Education method: Explanation, Demonstration, and Handouts Education comprehension: verbalized understanding     HOME EXERCISE PROGRAM: [id number]   ASSESSMENT:   CLINICAL IMPRESSION: Patient is a 76 y.o. male who was seen today for physical therapy evaluation and treatment for bilateral hand weakness. Objective impairments include decreased activity tolerance, decreased ROM,  decreased strength, impaired UE functional use, and pain. These impairments are limiting patient from cleaning and community activity. Personal factors including Age, Time since onset of injury/illness/exacerbation, 1 comorbidity: x3 cervical surgeries, 1-2 comorbidities: x2 wrist/elbow surgeries, and 3+ comorbidities: lumbar fusion  are also affecting patient's functional outcome. Patient will benefit from skilled PT to address above impairments and improve overall function.   Continued to progress strengthening exercises as tolerated. Added self mobilization techniques as well which were tolerated well. No pain noted end of session. No follow up apt at this time, patient with plan to call and schedule future apt once he knows his schedule more. Encouraged adherence with HEP.    REHAB POTENTIAL: Good   CLINICAL DECISION MAKING: Stable/uncomplicated   EVALUATION COMPLEXITY: Low     GOALS: Goals reviewed with patient? Yes   SHORT TERM GOALS:   STG Name Target Date Goal status  1 Patient will be independent in self management strategies to improve quality of life and functional outcomes. Baseline:  05/22/2021  INITIAL  2 Patient will report at least 50% improvement in overall symptoms and/or function to demonstrate improved functional mobility Baseline: 0% 05/22/2021  INITIAL  3 Patient will be able to demonstrate at least 50# of left grip strength Baseline: 05/22/2021  INITIAL  4   Baseline:      5   Baseline:      6   Baseline:      7   Baseline:        LONG TERM GOALS:    LTG Name Target Date Goal status  1 Patient will report at least 75% improvement in overall symptoms and/or function to demonstrate improved functional mobility Baseline:0% 06/19/2021  INITIAL  2 Patient will demonstrate improved left UE strength with at least 4/5 MMT strength Baseline: 06/19/2021  INITIAL  3 Patient will report being able to type on keyboard with improved dexterity without hitting wrong keys  because of left pinky finger Baseline: 06/19/2021  INITIAL  4   Baseline:      5   Baseline:      6   Baseline:      7   Baseline:            PLAN: PT FREQUENCY: 1-2x/week for total of 12 visits   PT DURATION: 8 weeks   PLANNED INTERVENTIONS: Therapeutic exercises, Therapeutic activity, Neuro Muscular re-education, Balance training, Gait training, Patient/Family education, Joint mobilization, Dry Needling, Electrical stimulation, Cryotherapy, Moist heat, and Manual therapy   PLAN FOR NEXT SESSION: shoulder strengthening, grip and wrist strength and mobility   11:01 AM, 05/08/21 Jerene Pitch, DPT Physical Therapy with [facility]  [phone] office

## 2021-05-11 ENCOUNTER — Encounter: Payer: Self-pay | Admitting: Physical Therapy

## 2021-05-11 ENCOUNTER — Other Ambulatory Visit: Payer: Self-pay

## 2021-05-11 ENCOUNTER — Ambulatory Visit (INDEPENDENT_AMBULATORY_CARE_PROVIDER_SITE_OTHER): Payer: Medicare Other | Admitting: Physical Therapy

## 2021-05-11 DIAGNOSIS — M6281 Muscle weakness (generalized): Secondary | ICD-10-CM

## 2021-05-11 DIAGNOSIS — M542 Cervicalgia: Secondary | ICD-10-CM | POA: Diagnosis not present

## 2021-05-11 NOTE — Therapy (Signed)
OUTPATIENT PHYSICAL THERAPY TREATMENT NOTE   Patient Name: Taylor Berger MRN: [id number] DOB:06-16-1945, 76 y.o., male Today's Date: 05/11/2021  PCP: Inda Coke, PA REFERRING PROVIDER: Inda Coke, PA   PT End of Session - 05/11/21 1017     Visit Number 3    Number of Visits 12    Date for PT Re-Evaluation 06/19/21    Authorization Type medicare    Progress Note Due on Visit 10    PT Start Time 1018    PT Stop Time 1056    PT Time Calculation (min) 38 min    Activity Tolerance Patient tolerated treatment well             Past Medical History:  Diagnosis Date   Allergy    Seasonal   Aortic aneurysm ([place])    Depression    DVT (deep venous thrombosis) ([place]) 2020   GERD (gastroesophageal reflux disease)    Goiter    Hyperlipidemia    Hypertension    Lymphoma ([place]) 7371   Follicular, stomach   Neuropathy of both feet    Past Surgical History:  Procedure Laterality Date   CARPAL TUNNEL RELEASE Left    CERVICAL DISCECTOMY  1995   C-7   CERVICAL FUSION  01/1989   C-3 through C-6 / no metal   COLONOSCOPY     ELBOW SURGERY Left    FOOT SURGERY Left 1999   Plantar fascitis   low back fusion  2013   Lumbar & Low Thoracic with metal   SHOULDER ARTHROSCOPY WITH ROTATOR CUFF REPAIR Right 2014   UPPER GASTROINTESTINAL ENDOSCOPY     Patient Active Problem List   Diagnosis Date Noted   Follicular lymphoma grade I of intra-abdominal lymph nodes ([place]) 03/07/2020   Hypertension 01/29/2020   Thoracic aortic aneurysm 01/29/2020   DVT R femoral thigh 01/29/2020   GERD (gastroesophageal reflux disease) 01/29/2020   History of Barrett's esophagus 01/29/2020   Goiter 01/29/2020   Neuropathy of both feet    Lymphoma ([place]) 2020   PCP: Inda Coke, PA   REFERRING PROVIDER: Gregor Hams, MD   REFERRING DIAG: G56.22 (ICD-10-CM) - Cubital tunnel syndrome on left   THERAPY DIAG:  Cervicalgia   Muscle weakness (generalized)   ONSET DATE: increased over  the last year   SUBJECTIVE:                                                                                                                                                                                                          SUBJECTIVE  STATEMENT: States that he has had some mid thoracic pain but he hasn't had the floor space to do his thoracic mobility exercises    PERTINENT HISTORY:  hx of L carapl tunnel release and a L ulnar nerve transposition about 5-6 years ago, cervial fusion with metal with last one 2011 (total of 3 neck surgeries), bilateral foot neuropathy, vertig, RCR R > 5 years ago     PAIN:  Are you having pain? Yes NPRS scale: 3/10 Pain location: along shoulder blade Pain orientation: Right  PAIN TYPE: aching and tight Pain description: intermittent  Aggravating factors: sleeping in weird position Relieving factors: heat   PRECAUTIONS: None   WEIGHT BEARING RESTRICTIONS No   FALLS:  Has patient fallen in last 6 months? 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 Postsurgical straightening of normal lordosis with anterior fusion C3 through C7. No hardware complication. 2. Multilevel facet hypertrophy. Bony neural foraminal stenosis appears more prominent on the left, at C4-C5, C5-C6, and C6-C7.         COGNITION: Overall cognitive status: Within functional limits for tasks assessed                POSTURE:  rounded shoulders, forward head, increased thoracic kyphosis, reduced lumbar lordosis   PALPATION:           Tenderness to palpation along bilateral traps, thoracic paraspinals and biceps R >L   CERVICAL AROM/PROM   A/PROM A/PROM (deg) 04/24/2021  Flexion 12  Extension 18  Right lateral flexion 14*  Left lateral flexion 8*  Right rotation 35*  Left rotation  42   (Blank rows = not tested) *pain on contralateral side    UE Measurements       Upper Extremity Right 04/24/2021 Left 04/24/2021    A/PROM MMT A/PROM MMT  Shoulder Flexion 152 4+ 155 4*  Shoulder Extension          Shoulder Abduction       4*  Shoulder Adduction          Shoulder Internal Rotation Reaches to L4 SP */30   *right scapular inferior border /60 4*  Shoulder External Rotation right scapular superior border   Reaches to T2 SP  4*  Elbow Flexion       4+  Elbow Extension       4+  Wrist Flexion 45 4+ 45* 4-  Wrist Extension 65* 4+ 55 3+  Wrist Supination 75 4+ 62 3+  Wrist Pronation   4+   3+  Wrist Ulnar Deviation          Wrist Radial Deviation          Grip Strength NA 60# NA 35#                      (Blank rows = not tested) *pain   CERVICAL SPECIAL TESTS:  Nerve tests negative bilaterally (median/radial and ulnar) Negative distraction and Spurling test       TODAY'S TREATMENT:   05/11/2021 Therapeutic Exercise: Seated: resisted finger extension with grey band 5x5 5" holds bilat with slow return to starting position- isolated finger movements on left, finger flexion 0 isolated 5x5 each bilat green band - performed in different positions(neutral/supination and pronation), isolated pinky extension with rubber band  x4 30" hlds bilat.wrist extension isometric with red band x5 20" holds B  04/24/21 Therapeutic Exercise:           Aerobic: Supine: Prone:           Seated: supination stretch with 4# x10 Left, wrist stretch flexion and extension x5 10" holds Left, grip in neutral/supination/prontation with towel x5 " holds left           Standing:       PATIENT EDUCATION:  Education details: on visual feedback for improved motor outcomes with neuropathy. Person educated: Patient Education method: Explanation, Demonstration, and Handouts Education comprehension: verbalized understanding     HOME EXERCISE PROGRAM: [id number]   ASSESSMENT:   CLINICAL  IMPRESSION: Patient is a 76 y.o. male who was seen today for physical therapy evaluation and treatment for bilateral hand weakness. Objective impairments include decreased activity tolerance, decreased ROM, decreased strength, impaired UE functional use, and pain. These impairments are limiting patient from cleaning and community activity. Personal factors including Age, Time since onset of injury/illness/exacerbation, 1 comorbidity: x3 cervical surgeries, 1-2 comorbidities: x2 wrist/elbow surgeries, and 3+ comorbidities: lumbar fusion  are also affecting patient's functional outcome. Patient will benefit from skilled PT to address above impairments and improve overall function.   Session focused on isolated finger and wrist strengthening exercises. Patient easily distracted on this date but able to redirect with visual cue. No pain noted in hands but fatigue in fingers. Pinky finger required most focus for exercises. Will continue with current POC.   REHAB POTENTIAL: Good   CLINICAL DECISION MAKING: Stable/uncomplicated   EVALUATION COMPLEXITY: Low     GOALS: Goals reviewed with patient? Yes   SHORT TERM GOALS:   STG Name Target Date Goal status  1 Patient will be independent in self management strategies to improve quality of life and functional outcomes. Baseline:  05/22/2021  INITIAL  2 Patient will report at least 50% improvement in overall symptoms and/or function to demonstrate improved functional mobility Baseline: 0% 05/22/2021  INITIAL  3 Patient will be able to demonstrate at least 50# of left grip strength Baseline: 05/22/2021  INITIAL  4   Baseline:      5   Baseline:      6   Baseline:      7   Baseline:        LONG TERM GOALS:    LTG Name Target Date Goal status  1 Patient will report at least 75% improvement in overall symptoms and/or function to demonstrate improved functional mobility Baseline:0% 06/19/2021  INITIAL  2 Patient will demonstrate improved left UE  strength with at least 4/5 MMT strength Baseline: 06/19/2021  INITIAL  3 Patient will report being able to type on keyboard with improved dexterity without hitting wrong keys because of left pinky finger Baseline: 06/19/2021  INITIAL  4   Baseline:      5   Baseline:      6   Baseline:      7   Baseline:            PLAN: PT FREQUENCY: 1-2x/week for total of 12 visits   PT DURATION: 8 weeks   PLANNED INTERVENTIONS: Therapeutic exercises, Therapeutic activity, Neuro Muscular re-education, Balance training, Gait training, Patient/Family education, Joint mobilization, Dry Needling, Electrical stimulation, Cryotherapy, Moist heat, and Manual therapy   PLAN FOR NEXT SESSION: shoulder strengthening, grip and wrist strength and mobility   11:09 AM, 05/11/21 Jerene Pitch, DPT Physical Therapy with [facility]  [facility]  [phone] office

## 2021-05-16 ENCOUNTER — Other Ambulatory Visit: Payer: Self-pay

## 2021-05-16 ENCOUNTER — Ambulatory Visit (INDEPENDENT_AMBULATORY_CARE_PROVIDER_SITE_OTHER): Payer: Medicare Other | Admitting: Physical Therapy

## 2021-05-16 ENCOUNTER — Encounter: Payer: Self-pay | Admitting: Physical Therapy

## 2021-05-16 DIAGNOSIS — M542 Cervicalgia: Secondary | ICD-10-CM | POA: Diagnosis not present

## 2021-05-16 DIAGNOSIS — M6281 Muscle weakness (generalized): Secondary | ICD-10-CM

## 2021-05-16 NOTE — Therapy (Signed)
OUTPATIENT PHYSICAL THERAPY TREATMENT NOTE   Patient Name: Taylor Berger MRN: [id number] DOB:1946/03/17, 76 y.o., male Today's Date: 05/16/2021  PCP: Inda Coke, PA REFERRING PROVIDER: Inda Coke, [place]   PT End of Session - 05/16/21 0934     Visit Number 4    Number of Visits 12    Date for PT Re-Evaluation 06/19/21    Authorization Type medicare    Progress Note Due on Visit 10    PT Start Time 0934    PT Stop Time 1017    PT Time Calculation (min) 40 min             Past Medical History:  Diagnosis Date   Allergy    Seasonal   Aortic aneurysm ([place])    Depression    DVT (deep venous thrombosis) ([place]) 2020   GERD (gastroesophageal reflux disease)    Goiter    Hyperlipidemia    Hypertension    Lymphoma ([place]) 5102   Follicular, stomach   Neuropathy of both feet    Past Surgical History:  Procedure Laterality Date   CARPAL TUNNEL RELEASE Left    CERVICAL DISCECTOMY  1995   C-7   CERVICAL FUSION  01/1989   C-3 through C-6 / no metal   COLONOSCOPY     ELBOW SURGERY Left    FOOT SURGERY Left 1999   Plantar fascitis   low back fusion  2013   Lumbar & Low Thoracic with metal   SHOULDER ARTHROSCOPY WITH ROTATOR CUFF REPAIR Right 2014   UPPER GASTROINTESTINAL ENDOSCOPY     Patient Active Problem List   Diagnosis Date Noted   Follicular lymphoma grade I of intra-abdominal lymph nodes ([place]) 03/07/2020   Hypertension 01/29/2020   Thoracic aortic aneurysm 01/29/2020   DVT R femoral thigh 01/29/2020   GERD (gastroesophageal reflux disease) 01/29/2020   History of Barrett's esophagus 01/29/2020   Goiter 01/29/2020   Neuropathy of both feet    Lymphoma ([place]) 2020   PCP: Inda Coke, PA   REFERRING PROVIDER: Gregor Hams, MD   REFERRING DIAG: G56.22 (ICD-10-CM) - Cubital tunnel syndrome on left   THERAPY DIAG:  Cervicalgia   Muscle weakness (generalized)   ONSET DATE: increased over the last year   SUBJECTIVE:                                                                                                                                                                                                           SUBJECTIVE STATEMENT: States that he has no pain but continued  pain with hammer exercise. States he got his 2 and 4 pound weights   PERTINENT HISTORY:  hx of L carapl tunnel release and a L ulnar nerve transposition about 5-6 years ago, cervial fusion with metal with last one 2011 (total of 3 neck surgeries), bilateral foot neuropathy, vertig, RCR R > 5 years ago     PAIN:  Are you having pain? no NPRS scale: 0/10 Pain location: along shoulder blade Pain orientation: Right  PAIN TYPE: aching and tight Pain description: intermittent  Aggravating factors: sleeping in weird position Relieving factors: heat   PRECAUTIONS: None   WEIGHT BEARING RESTRICTIONS No   FALLS:  Has patient fallen in last 6 months? 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 Postsurgical straightening of normal lordosis with anterior fusion C3 through C7. No hardware complication. 2. Multilevel facet hypertrophy. Bony neural foraminal stenosis appears more prominent on the left, at C4-C5, C5-C6, and C6-C7.         COGNITION: Overall cognitive status: Within functional limits for tasks assessed                POSTURE:  rounded shoulders, forward head, increased thoracic kyphosis, reduced lumbar lordosis   PALPATION:           Tenderness to palpation along bilateral traps, thoracic paraspinals and biceps R >L   CERVICAL AROM/PROM   A/PROM A/PROM (deg) 04/24/2021  Flexion 12  Extension 18  Right lateral flexion 14*  Left lateral flexion 8*  Right rotation 35*  Left rotation 42   (Blank rows = not tested) *pain on contralateral side    UE  Measurements       Upper Extremity Right 04/24/2021 Left 04/24/2021    A/PROM MMT A/PROM MMT  Shoulder Flexion 152 4+ 155 4*  Shoulder Extension          Shoulder Abduction       4*  Shoulder Adduction          Shoulder Internal Rotation Reaches to L4 SP */30   *right scapular inferior border /60 4*  Shoulder External Rotation right scapular superior border   Reaches to T2 SP  4*  Elbow Flexion       4+  Elbow Extension       4+  Wrist Flexion 45 4+ 45* 4-  Wrist Extension 65* 4+ 55 3+  Wrist Supination 75 4+ 62 3+  Wrist Pronation   4+   3+  Wrist Ulnar Deviation          Wrist Radial Deviation          Grip Strength NA 60# NA 35#                      (Blank rows = not tested) *pain   CERVICAL SPECIAL TESTS:  Nerve tests negative bilaterally (median/radial and ulnar) Negative distraction and Spurling test       TODAY'S TREATMENT:   05/16/2021 Therapeutic Exercise: Standing: wrist/finger "push ups" - first at table - very challenging - transitioned to wall  20 minutes total- isolated finger motions, wrist ext/flexion and ulnar deviation/radial deviation, door knob rotations at wall 3x10 bilat and each direction, shoulder extension with towel x15 10" holds Seated: self mobilization to left triceps with tennis ball, shoulder  rolls in both directions x20 bila   04/24/21 Therapeutic Exercise:           Aerobic: Supine: Prone:           Seated: supination stretch with 4# x10 Left, wrist stretch flexion and extension x5 10" holds Left, grip in neutral/supination/prontation with towel x5 " holds left           Standing:       PATIENT EDUCATION:  Education details: on rationale for exercises, on HEP Person educated: Patient Education method: Consulting civil engineer, Media planner, and Handouts Education comprehension: verbalized understanding     HOME EXERCISE PROGRAM: [id number]   ASSESSMENT:   CLINICAL IMPRESSION: Patient is a 76 y.o. male who was seen today for physical therapy  evaluation and treatment for bilateral hand weakness. Objective impairments include decreased activity tolerance, decreased ROM, decreased strength, impaired UE functional use, and pain. These impairments are limiting patient from cleaning and community activity. Personal factors including Age, Time since onset of injury/illness/exacerbation, 1 comorbidity: x3 cervical surgeries, 1-2 comorbidities: x2 wrist/elbow surgeries, and 3+ comorbidities: lumbar fusion  are also affecting patient's functional outcome. Patient will benefit from skilled PT to address above impairments and improve overall function.   05/16/2021 Focused on standing and seated exercises secondary to limitations in floor space at home. Tolerated moderately well with some exercises reproducing nerve symptoms, this was resolved with rest and shoulder stretches. Fatigue in arms and wrists noted end of session but no increase in radicular symptoms.   REHAB POTENTIAL: Good   CLINICAL DECISION MAKING: Stable/uncomplicated   EVALUATION COMPLEXITY: Low     GOALS: Goals reviewed with patient? Yes   SHORT TERM GOALS:   STG Name Target Date Goal status  1 Patient will be independent in self management strategies to improve quality of life and functional outcomes. Baseline:  05/22/2021  INITIAL  2 Patient will report at least 50% improvement in overall symptoms and/or function to demonstrate improved functional mobility Baseline: 0% 05/22/2021  INITIAL  3 Patient will be able to demonstrate at least 50# of left grip strength Baseline: 05/22/2021  INITIAL  4   Baseline:      5   Baseline:      6   Baseline:      7   Baseline:        LONG TERM GOALS:    LTG Name Target Date Goal status  1 Patient will report at least 75% improvement in overall symptoms and/or function to demonstrate improved functional mobility Baseline:0% 06/19/2021  INITIAL  2 Patient will demonstrate improved left UE strength with at least 4/5 MMT  strength Baseline: 06/19/2021  INITIAL  3 Patient will report being able to type on keyboard with improved dexterity without hitting wrong keys because of left pinky finger Baseline: 06/19/2021  INITIAL  4   Baseline:      5   Baseline:      6   Baseline:      7   Baseline:            PLAN: PT FREQUENCY: 1-2x/week for total of 12 visits   PT DURATION: 8 weeks   PLANNED INTERVENTIONS: Therapeutic exercises, Therapeutic activity, Neuro Muscular re-education, Balance training, Gait training, Patient/Family education, Joint mobilization, Dry Needling, Electrical stimulation, Cryotherapy, Moist heat, and Manual therapy   PLAN FOR NEXT SESSION: shoulder strengthening, grip and wrist strength and mobility   10:14 AM, 05/16/21 Jerene Pitch, DPT Physical Therapy with [facility]  [phone]  office

## 2021-05-18 ENCOUNTER — Other Ambulatory Visit: Payer: Self-pay

## 2021-05-18 ENCOUNTER — Encounter: Payer: Self-pay | Admitting: Physical Therapy

## 2021-05-18 ENCOUNTER — Ambulatory Visit (INDEPENDENT_AMBULATORY_CARE_PROVIDER_SITE_OTHER): Payer: Medicare Other | Admitting: Physical Therapy

## 2021-05-18 DIAGNOSIS — M542 Cervicalgia: Secondary | ICD-10-CM

## 2021-05-18 DIAGNOSIS — M6281 Muscle weakness (generalized): Secondary | ICD-10-CM

## 2021-05-18 NOTE — Therapy (Signed)
OUTPATIENT PHYSICAL THERAPY TREATMENT NOTE   Patient Name: Taylor Berger MRN: [id number] DOB:19-Apr-1945, 76 y.o., male Today's Date: 05/18/2021  PCP: Inda Coke, PA REFERRING PROVIDER: Inda Coke, PA   PT End of Session - 05/18/21 1013     Visit Number 5    Number of Visits 12    Date for PT Re-Evaluation 06/19/21    Authorization Type medicare    Progress Note Due on Visit 10    PT Start Time 2952    PT Stop Time 1055    PT Time Calculation (min) 40 min             Past Medical History:  Diagnosis Date   Allergy    Seasonal   Aortic aneurysm ([place])    Depression    DVT (deep venous thrombosis) ([place]) 2020   GERD (gastroesophageal reflux disease)    Goiter    Hyperlipidemia    Hypertension    Lymphoma ([place]) 8413   Follicular, stomach   Neuropathy of both feet    Past Surgical History:  Procedure Laterality Date   CARPAL TUNNEL RELEASE Left    CERVICAL DISCECTOMY  1995   C-7   CERVICAL FUSION  01/1989   C-3 through C-6 / no metal   COLONOSCOPY     ELBOW SURGERY Left    FOOT SURGERY Left 1999   Plantar fascitis   low back fusion  2013   Lumbar & Low Thoracic with metal   SHOULDER ARTHROSCOPY WITH ROTATOR CUFF REPAIR Right 2014   UPPER GASTROINTESTINAL ENDOSCOPY     Patient Active Problem List   Diagnosis Date Noted   Follicular lymphoma grade I of intra-abdominal lymph nodes ([place]) 03/07/2020   Hypertension 01/29/2020   Thoracic aortic aneurysm 01/29/2020   DVT R femoral thigh 01/29/2020   GERD (gastroesophageal reflux disease) 01/29/2020   History of Barrett's esophagus 01/29/2020   Goiter 01/29/2020   Neuropathy of both feet    Lymphoma ([place]) 2020   PCP: Inda Coke, PA   REFERRING PROVIDER: Gregor Hams, MD   REFERRING DIAG: G56.22 (ICD-10-CM) - Cubital tunnel syndrome on left   THERAPY DIAG:  Cervicalgia   Muscle weakness (generalized)   ONSET DATE: increased over the last year   SUBJECTIVE:                                                                                                                                                                                                           SUBJECTIVE STATEMENT: States that he feels like he has a  muscle ache in the forearm. States that he joined [employer].    PERTINENT HISTORY:  hx of L carapl tunnel release and a L ulnar nerve transposition about 5-6 years ago, cervial fusion with metal with last one 2011 (total of 3 neck surgeries), bilateral foot neuropathy, vertig, RCR R > 5 years ago     PAIN:  Are you having pain? yes NPRS scale: 3/10 Pain location: left forearm Pain orientation: Right  PAIN TYPE: aching and annoying Pain description: intermittent  Aggravating factors: sleeping in weird position Relieving factors: heat   PRECAUTIONS: None   WEIGHT BEARING RESTRICTIONS No   FALLS:  Has patient fallen in last 6 months? 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 Postsurgical straightening of normal lordosis with anterior fusion C3 through C7. No hardware complication. 2. Multilevel facet hypertrophy. Bony neural foraminal stenosis appears more prominent on the left, at C4-C5, C5-C6, and C6-C7.         COGNITION: Overall cognitive status: Within functional limits for tasks assessed                POSTURE:  rounded shoulders, forward head, increased thoracic kyphosis, reduced lumbar lordosis   PALPATION:           Tenderness to palpation along bilateral traps, thoracic paraspinals and biceps R >L   CERVICAL AROM/PROM   A/PROM A/PROM (deg) 04/24/2021  Flexion 12  Extension 18  Right lateral flexion 14*  Left lateral flexion 8*  Right rotation 35*  Left rotation 42   (Blank rows = not tested) *pain on contralateral side    UE  Measurements       Upper Extremity Right 04/24/2021 Left 04/24/2021    A/PROM MMT A/PROM MMT  Shoulder Flexion 152 4+ 155 4*  Shoulder Extension          Shoulder Abduction       4*  Shoulder Adduction          Shoulder Internal Rotation Reaches to L4 SP */30   *right scapular inferior border /60 4*  Shoulder External Rotation right scapular superior border   Reaches to T2 SP  4*  Elbow Flexion       4+  Elbow Extension       4+  Wrist Flexion 45 4+ 45* 4-  Wrist Extension 65* 4+ 55 3+  Wrist Supination 75 4+ 62 3+  Wrist Pronation   4+   3+  Wrist Ulnar Deviation          Wrist Radial Deviation          Grip Strength NA 60# NA 35#                      (Blank rows = not tested) *pain   CERVICAL SPECIAL TESTS:  Nerve tests negative bilaterally (median/radial and ulnar) Negative distraction and Spurling test       TODAY'S TREATMENT:   05/18/2021 Manual therapy: STM to left wrist flexors and extensors - tolerated well used  metal tool  Therapeutic Exercise: Seated: wrist circles with 2# weight bilat x20 clockwise and counter clockwise, radial deviation x25 bilat 3#, wrist flexion and extension stretch x3 30" holds bilateral and each, prayer stretch x10 10' holds   04/24/21 Therapeutic Exercise:  Aerobic: Supine: Prone:           Seated: supination stretch with 4# x10 Left, wrist stretch flexion and extension x5 10" holds Left, grip in neutral/supination/prontation with towel x5 " holds left           Standing:       PATIENT EDUCATION:  Education details: on rationale for exercises, on HEP, on self mobilization, benefits for infrared heat, benefits of percussion gun Person educated: Patient Education method: Explanation, Demonstration, and Handouts Education comprehension: verbalized understanding     HOME EXERCISE PROGRAM: [id number]   ASSESSMENT:   CLINICAL IMPRESSION: Patient is a 76 y.o. male who was seen today for physical therapy evaluation and  treatment for bilateral hand weakness. Objective impairments include decreased activity tolerance, decreased ROM, decreased strength, impaired UE functional use, and pain. These impairments are limiting patient from cleaning and community activity. Personal factors including Age, Time since onset of injury/illness/exacerbation, 1 comorbidity: x3 cervical surgeries, 1-2 comorbidities: x2 wrist/elbow surgeries, and 3+ comorbidities: lumbar fusion  are also affecting patient's functional outcome. Patient will benefit from skilled PT to address above impairments and improve overall function.   05/18/2021 Continued to progress exercises. Added STM to left wrist extensors and flexors and tolerated well. Instructed patient on self mobilization and discussed different interventions for pain relief per patient interest( mobilization gun, infrared heat). Will continue with current POC as tolerated by patient.   REHAB POTENTIAL: Good   CLINICAL DECISION MAKING: Stable/uncomplicated   EVALUATION COMPLEXITY: Low     GOALS: Goals reviewed with patient? Yes   SHORT TERM GOALS:   STG Name Target Date Goal status  1 Patient will be independent in self management strategies to improve quality of life and functional outcomes. Baseline:  05/22/2021  INITIAL  2 Patient will report at least 50% improvement in overall symptoms and/or function to demonstrate improved functional mobility Baseline: 0% 05/22/2021  INITIAL  3 Patient will be able to demonstrate at least 50# of left grip strength Baseline: 05/22/2021  INITIAL  4   Baseline:      5   Baseline:      6   Baseline:      7   Baseline:        LONG TERM GOALS:    LTG Name Target Date Goal status  1 Patient will report at least 75% improvement in overall symptoms and/or function to demonstrate improved functional mobility Baseline:0% 06/19/2021  INITIAL  2 Patient will demonstrate improved left UE strength with at least 4/5 MMT strength Baseline:  06/19/2021  INITIAL  3 Patient will report being able to type on keyboard with improved dexterity without hitting wrong keys because of left pinky finger Baseline: 06/19/2021  INITIAL  4   Baseline:      5   Baseline:      6   Baseline:      7   Baseline:            PLAN: PT FREQUENCY: 1-2x/week for total of 12 visits   PT DURATION: 8 weeks   PLANNED INTERVENTIONS: Therapeutic exercises, Therapeutic activity, Neuro Muscular re-education, Balance training, Gait training, Patient/Family education, Joint mobilization, Dry Needling, Electrical stimulation, Cryotherapy, Moist heat, and Manual therapy   PLAN FOR NEXT SESSION: shoulder strengthening, grip and wrist strength and mobility   10:58 AM, 05/18/21 Jerene Pitch, DPT Physical Therapy with [facility]  [phone] office

## 2021-05-19 ENCOUNTER — Ambulatory Visit (INDEPENDENT_AMBULATORY_CARE_PROVIDER_SITE_OTHER): Payer: Medicare Other

## 2021-05-19 VITALS — BP 114/68 | HR 50 | Temp 98.5°F | Wt 208.2 lb

## 2021-05-19 DIAGNOSIS — Z0000 Encounter for general adult medical examination without abnormal findings: Secondary | ICD-10-CM | POA: Diagnosis not present

## 2021-05-19 NOTE — Patient Instructions (Signed)
[name] , Thank you for taking time to come for your Medicare Wellness Visit. I appreciate your ongoing commitment to your health goals. Please review the following plan we discussed and let me know if I can assist you in the future.   Screening recommendations/referrals: Colonoscopy: No longer required  Recommended yearly ophthalmology/optometry visit for glaucoma screening and checkup Recommended yearly dental visit for hygiene and checkup  Vaccinations: Influenza vaccine: Done 01/29/21 repeat every year  Pneumococcal vaccine: Up to date Tdap vaccine: Done 12/10/15 repeat every 10 years  Shingles vaccine: Completed 01/27/19 & 04/29/19   Covid-19: Completed 2/17, 3/17, 01/26/20 & 01/15/21  Advanced directives: Please bring a copy of your health care power of attorney and living will to the office at your convenience.  Conditions/risks identified: Lose weight   Next appointment: Follow up in one year for your annual wellness visit.   Preventive Care 78 Years and Older, Male Preventive care refers to lifestyle choices and visits with your health care provider that can promote health and wellness. What does preventive care include? A yearly physical exam. This is also called an annual well check. Dental exams once or twice a year. Routine eye exams. Ask your health care provider how often you should have your eyes checked. Personal lifestyle choices, including: Daily care of your teeth and gums. Regular physical activity. Eating a healthy diet. Avoiding tobacco and drug use. Limiting alcohol use. Practicing safe sex. Taking low doses of aspirin every day. Taking vitamin and mineral supplements as recommended by your health care provider. What happens during an annual well check? The services and screenings done by your health care provider during your annual well check will depend on your age, overall health, lifestyle risk factors, and family history of disease. Counseling  Your  health care provider may ask you questions about your: Alcohol use. Tobacco use. Drug use. Emotional well-being. Home and relationship well-being. Sexual activity. Eating habits. History of falls. Memory and ability to understand (cognition). Work and work Statistician. Screening  You may have the following tests or measurements: Height, weight, and BMI. Blood pressure. Lipid and cholesterol levels. These may be checked every 5 years, or more frequently if you are over 65 years old. Skin check. Lung cancer screening. You may have this screening every year starting at age 80 if you have a 30-pack-year history of smoking and currently smoke or have quit within the past 15 years. Fecal occult blood test (FOBT) of the stool. You may have this test every year starting at age 45. Flexible sigmoidoscopy or colonoscopy. You may have a sigmoidoscopy every 5 years or a colonoscopy every 10 years starting at age 22. Prostate cancer screening. Recommendations will vary depending on your family history and other risks. Hepatitis C blood test. Hepatitis B blood test. Sexually transmitted disease (STD) testing. Diabetes screening. This is done by checking your blood sugar (glucose) after you have not eaten for a while (fasting). You may have this done every 1-3 years. Abdominal aortic aneurysm (AAA) screening. You may need this if you are a current or former smoker. Osteoporosis. You may be screened starting at age 41 if you are at high risk. Talk with your health care provider about your test results, treatment options, and if necessary, the need for more tests. Vaccines  Your health care provider may recommend certain vaccines, such as: Influenza vaccine. This is recommended every year. Tetanus, diphtheria, and acellular pertussis (Tdap, Td) vaccine. You may need a Td booster every 10  years. Zoster vaccine. You may need this after age 30. Pneumococcal 13-valent conjugate (PCV13) vaccine. One dose  is recommended after age 35. Pneumococcal polysaccharide (PPSV23) vaccine. One dose is recommended after age 28. Talk to your health care provider about which screenings and vaccines you need and how often you need them. This information is not intended to replace advice given to you by your health care provider. Make sure you discuss any questions you have with your health care provider. Document Released: 04/15/2015 Document Revised: 12/07/2015 Document Reviewed: 01/18/2015 Elsevier Interactive Patient Education  2017 [employer] Prevention in the Home Falls can cause injuries. They can happen to people of all ages. There are many things you can do to make your home safe and to help prevent falls. What can I do on the outside of my home? Regularly fix the edges of walkways and driveways and fix any cracks. Remove anything that might make you trip as you walk through a door, such as a raised step or threshold. Trim any bushes or trees on the path to your home. Use bright outdoor lighting. Clear any walking paths of anything that might make someone trip, such as rocks or tools. Regularly check to see if handrails are loose or broken. Make sure that both sides of any steps have handrails. Any raised decks and porches should have guardrails on the edges. Have any leaves, snow, or ice cleared regularly. Use sand or salt on walking paths during winter. Clean up any spills in your garage right away. This includes oil or grease spills. What can I do in the bathroom? Use night lights. Install grab bars by the toilet and in the tub and shower. Do not use towel bars as grab bars. Use non-skid mats or decals in the tub or shower. If you need to sit down in the shower, use a plastic, non-slip stool. Keep the floor dry. Clean up any water that spills on the floor as soon as it happens. Remove soap buildup in the tub or shower regularly. Attach bath mats securely with double-sided non-slip rug  tape. Do not have throw rugs and other things on the floor that can make you trip. What can I do in the bedroom? Use night lights. Make sure that you have a light by your bed that is easy to reach. Do not use any sheets or blankets that are too big for your bed. They should not hang down onto the floor. Have a firm chair that has side arms. You can use this for support while you get dressed. Do not have throw rugs and other things on the floor that can make you trip. What can I do in the kitchen? Clean up any spills right away. Avoid walking on wet floors. Keep items that you use a lot in easy-to-reach places. If you need to reach something above you, use a strong step stool that has a grab bar. Keep electrical cords out of the way. Do not use floor polish or wax that makes floors slippery. If you must use wax, use non-skid floor wax. Do not have throw rugs and other things on the floor that can make you trip. What can I do with my stairs? Do not leave any items on the stairs. Make sure that there are handrails on both sides of the stairs and use them. Fix handrails that are broken or loose. Make sure that handrails are as long as the stairways. Check any carpeting to make sure  that it is firmly attached to the stairs. Fix any carpet that is loose or worn. Avoid having throw rugs at the top or bottom of the stairs. If you do have throw rugs, attach them to the floor with carpet tape. Make sure that you have a light switch at the top of the stairs and the bottom of the stairs. If you do not have them, ask someone to add them for you. What else can I do to help prevent falls? Wear shoes that: Do not have high heels. Have rubber bottoms. Are comfortable and fit you well. Are closed at the toe. Do not wear sandals. If you use a stepladder: Make sure that it is fully opened. Do not climb a closed stepladder. Make sure that both sides of the stepladder are locked into place. Ask someone to  hold it for you, if possible. Clearly mark and make sure that you can see: Any grab bars or handrails. First and last steps. Where the edge of each step is. Use tools that help you move around (mobility aids) if they are needed. These include: Canes. Walkers. Scooters. Crutches. Turn on the lights when you go into a dark area. Replace any light bulbs as soon as they burn out. Set up your furniture so you have a clear path. Avoid moving your furniture around. If any of your floors are uneven, fix them. If there are any pets around you, be aware of where they are. Review your medicines with your doctor. Some medicines can make you feel dizzy. This can increase your chance of falling. Ask your doctor what other things that you can do to help prevent falls. This information is not intended to replace advice given to you by your health care provider. Make sure you discuss any questions you have with your health care provider. Document Released: 01/13/2009 Document Revised: 08/25/2015 Document Reviewed: 04/23/2014 Elsevier Interactive Patient Education  2017 [employer].

## 2021-05-19 NOTE — Progress Notes (Signed)
Subjective:   Taylor Berger is a 76 y.o. male who presents for Medicare Annual/Subsequent preventive examination.  Review of Systems     Cardiac Risk Factors include: advanced age (>45men, >23 women);male gender;hypertension;obesity (BMI >30kg/m2)     Objective:    Today's Vitals   05/19/21 1104  BP: 114/68  Pulse: (!) 50  Temp: 98.5 F (36.9 C)  SpO2: 96%  Weight: 208 lb 3.2 oz (94.4 kg)   Body mass index is 31.66 kg/m.  Advanced Directives 05/19/2021 04/24/2021 11/17/2020 05/23/2020 05/13/2020 03/10/2020 03/08/2020  Does Patient Have a Medical Advance Directive? Yes No No No No No No  Type of Advance Directive [place] in Chart? No - copy requested - - - - - -  Would patient like information on creating a medical advance directive? - No - Patient declined - No - Patient declined No - Patient declined No - Patient declined -    Current Medications (verified) Outpatient Encounter Medications as of 05/19/2021  Medication Sig   amLODipine (NORVASC) 10 MG tablet Take 1 tablet by mouth once daily   atorvastatin (LIPITOR) 40 MG tablet Take 1 tablet by mouth once daily   cetirizine (ZYRTEC) 10 MG tablet Take 10 mg by mouth daily.   clindamycin (CLEOCIN-T) 1 % external solution Apply topically 2 (two) times daily. (Patient taking differently: Apply topically as needed.)   esomeprazole (NEXIUM) 20 MG capsule Take 20 mg by mouth daily at 12 noon.   fluticasone (FLONASE) 50 MCG/ACT nasal spray Place into both nostrils as needed for allergies or rhinitis.   lisinopril (ZESTRIL) 5 MG tablet Take 1 tablet by mouth once daily   rivaroxaban (XARELTO) 10 MG TABS tablet Take 1 tablet (10 mg total) by mouth daily. (Patient taking differently: Take 20 mg by mouth daily.)   valACYclovir (VALTREX) 500 MG tablet Take 1 tablet (500 mg total) by mouth daily.   ofloxacin (OCUFLOX) 0.3 % ophthalmic solution  (Patient not taking: Reported  on 05/19/2021)   prednisoLONE acetate (PRED FORTE) 1 % ophthalmic suspension  (Patient not taking: Reported on 05/19/2021)   No facility-administered encounter medications on file as of 05/19/2021.    Allergies (verified) Patient has no known allergies.   History: Past Medical History:  Diagnosis Date   Allergy    Seasonal   Aortic aneurysm (HCC)    Depression    DVT (deep venous thrombosis) ([facility]) 2020   GERD (gastroesophageal reflux disease)    Goiter    Hyperlipidemia    Hypertension    Lymphoma ([facility]) 7829   Follicular, stomach   Neuropathy of both feet    Past Surgical History:  Procedure Laterality Date   CARPAL TUNNEL RELEASE Left    CERVICAL DISCECTOMY  1995   C-7   CERVICAL FUSION  01/1989   C-3 through C-6 / no metal   COLONOSCOPY     ELBOW SURGERY Left    FOOT SURGERY Left 1999   Plantar fascitis   low back fusion  2013   Lumbar & Low Thoracic with metal   SHOULDER ARTHROSCOPY WITH ROTATOR CUFF REPAIR Right 2014   UPPER GASTROINTESTINAL ENDOSCOPY     Family History  Problem Relation Age of Onset   Diabetes Mother    Hypertension Mother    Hyperlipidemia Mother    Heart failure Mother    Leukemia Father    Stroke Father    Esophageal cancer  Neg Hx    Colon polyps Neg Hx    Pancreatic cancer Neg Hx    Stomach cancer Neg Hx    Rectal cancer Neg Hx    Social History   Socioeconomic History   Marital status: Married    Spouse name: Not on file   Number of children: Not on file   Years of education: Not on file   Highest education level: Not on file  Occupational History   Occupation: retired   Tobacco Use   Smoking status: Never   Smokeless tobacco: Never  Vaping Use   Vaping Use: Never used  Substance and Sexual Activity   Alcohol use: Not Currently   Drug use: Never   Sexual activity: Not Currently  Other Topics Concern   Not on file  Social History Narrative   ** Merged History Encounter **       Moved from [place] to be closer to  son Married   Social Determinants of Radio broadcast assistant Strain: Low Risk    Difficulty of Paying Living Expenses: Not hard at all  Food Insecurity: No Food Insecurity   Worried About Charity fundraiser in the Last Year: Never true   Arboriculturist in the Last Year: Never true  Transportation Needs: No Transportation Needs   Lack of Transportation (Medical): No   Lack of Transportation (Non-Medical): No  Physical Activity: Inactive   Days of Exercise per Week: 0 days   Minutes of Exercise per Session: 0 min  Stress: No Stress Concern Present   Feeling of Stress : Not at all  Social Connections: Moderately Isolated   Frequency of Communication with Friends and Family: Once a week   Frequency of Social Gatherings with Friends and Family: Twice a week   Attends Religious Services: Never   Marine scientist or Organizations: No   Attends Music therapist: Never   Marital Status: Married    Tobacco Counseling Counseling given: Not Answered   Clinical Intake:  Pre-visit preparation completed: Yes  Pain : No/denies pain     BMI - recorded: 31.66 Nutritional Status: BMI > 30  Obese Nutritional Risks: None Diabetes: No  How often do you need to have someone help you when you read instructions, pamphlets, or other written materials from your doctor or pharmacy?: 1 - Never  Diabetic?No  Interpreter Needed?: No  Information entered by :: Charlott Rakes, LPN   Activities of Daily Living In your present state of health, do you have any difficulty performing the following activities: 05/19/2021  Hearing? Y  Comment HOH  Vision? N  Difficulty concentrating or making decisions? N  Walking or climbing stairs? N  Dressing or bathing? N  Doing errands, shopping? N  Preparing Food and eating ? N  Using the Toilet? N  In the past six months, have you accidently leaked urine? N  Do you have problems with loss of bowel control? N  Managing your  Medications? N  Managing your Finances? N  Housekeeping or managing your Housekeeping? N  Some recent data might be hidden    Patient Care Team: Inda Coke, [place] as PCP - General (Physician Assistant) Elouise Munroe, MD as PCP - Cardiology (Cardiology)  Indicate any recent Medical Services you may have received from other than [facility] providers in the past year (date may be approximate).     Assessment:   This is a routine wellness examination for [employer].  Hearing/Vision screen  Hearing Screening - Comments:: Pt stated HOH  Vision Screening - Comments:: Pt [facility] For  annual eye exams   Dietary issues and exercise activities discussed: Current Exercise Habits: The patient does not participate in regular exercise at present   Goals Addressed             This Visit's Progress    Patient Stated       Lose weight        Depression Screen PHQ 2/9 Scores 05/19/2021 05/01/2021 05/13/2020 01/29/2020  PHQ - 2 Score 0 0 0 3  PHQ- 9 Score - - - 10    Fall Risk Fall Risk  05/19/2021 05/13/2020  Falls in the past year? 0 1  Number falls in past yr: 0 1  Injury with Fall? 0 0  Risk for fall due to : Impaired vision;Impaired balance/gait Impaired vision;Impaired balance/gait  Follow up Falls prevention discussed Falls prevention discussed    FALL RISK PREVENTION PERTAINING TO THE HOME:  Any stairs in or around the home? Yes  If so, are there any without handrails? No  Home free of loose throw rugs in walkways, pet beds, electrical cords, etc? Yes  Adequate lighting in your home to reduce risk of falls? Yes   ASSISTIVE DEVICES UTILIZED TO PREVENT FALLS:  Life alert? No  Use of a cane, walker or w/c? No  Grab bars in the bathroom? No  Shower chair or bench in shower? No  Elevated toilet seat or a handicapped toilet? No   TIMED UP AND GO:  Was the test performed? Yes .  Length of time to ambulate 10 feet: 10 sec.   Gait steady and  fast without use of assistive device  Cognitive Function:     6CIT Screen 05/19/2021 05/13/2020  What Year? 0 points 0 points  What month? 0 points 0 points  What time? 0 points -  Count back from 20 0 points 0 points  Months in reverse 0 points 0 points  Repeat phrase 0 points 2 points  Total Score 0 -    Immunizations Immunization History  Administered Date(s) Administered   Influenza, High Dose Seasonal PF 01/13/2020, 01/29/2021   Moderna Sars-Covid-2 Vaccination 05/20/2019, 06/17/2019   PFIZER(Purple Top)SARS-COV-2 Vaccination 01/26/2020   Pfizer Covid-19 Vaccine Bivalent Booster 43yrs & up 01/15/2021   Pneumococcal Conjugate-13 01/06/2014   Pneumococcal Polysaccharide-23 01/06/2013   Tdap 12/10/2015   Zoster Recombinat (Shingrix) 01/27/2019, 04/29/2019    TDAP status: Up to date  Flu Vaccine status: Up to date  Pneumococcal vaccine status: Up to date  Covid-19 vaccine status: Completed vaccines  Qualifies for Shingles Vaccine? Yes   Zostavax completed Yes   Shingrix Completed?: Yes  Screening Tests Health Maintenance  Topic Date Due   TETANUS/TDAP  12/09/2025   Pneumonia Vaccine 36+ Years old  Completed   INFLUENZA VACCINE  Completed   COVID-19 Vaccine  Completed   Hepatitis C Screening  Completed   Zoster Vaccines- Shingrix  Completed   HPV VACCINES  Aged Out   COLONOSCOPY (Pts 45-27yrs Insurance coverage will need to be confirmed)  Discontinued    Health Maintenance  There are no preventive care reminders to display for this patient.  Colorectal cancer screening: No longer required.    Additional Screening:  Hepatitis C Screening:  Completed 02/03/21  Vision Screening: Recommended annual ophthalmology exams for early detection of glaucoma and other disorders of the eye. Is the patient up to date with  their annual eye exam?  Yes  Who is the provider or what is the name of the office in which the patient attends annual eye exams? [facility]  If pt is not established with a provider, would they like to be referred to a provider to establish care? No .   Dental Screening: Recommended annual dental exams for proper oral hygiene  Community Resource Referral / Chronic Care Management: CRR required this visit?  No   CCM required this visit?  No      Plan:     I have personally reviewed and noted the following in the patients chart:   Medical and social history Use of alcohol, tobacco or illicit drugs  Current medications and supplements including opioid prescriptions. Patient is not currently taking opioid prescriptions. Functional ability and status Nutritional status Physical activity Advanced directives List of other physicians Hospitalizations, surgeries, and ER visits in previous 12 months Vitals Screenings to include cognitive, depression, and falls Referrals and appointments  In addition, I have reviewed and discussed with patient certain preventive protocols, quality metrics, and best practice recommendations. A written personalized care plan for preventive services as well as general preventive health recommendations were provided to patient.     Willette Brace, LPN   [id number]   Nurse Notes: None

## 2021-05-23 ENCOUNTER — Other Ambulatory Visit: Payer: Self-pay

## 2021-05-23 ENCOUNTER — Ambulatory Visit (INDEPENDENT_AMBULATORY_CARE_PROVIDER_SITE_OTHER): Payer: Medicare Other | Admitting: Physical Therapy

## 2021-05-23 ENCOUNTER — Encounter: Payer: Self-pay | Admitting: Physical Therapy

## 2021-05-23 DIAGNOSIS — M6281 Muscle weakness (generalized): Secondary | ICD-10-CM | POA: Diagnosis not present

## 2021-05-23 DIAGNOSIS — M542 Cervicalgia: Secondary | ICD-10-CM

## 2021-05-23 NOTE — Therapy (Signed)
OUTPATIENT PHYSICAL THERAPY TREATMENT NOTE and Discharge note   Patient Name: Taylor Berger MRN: [id number] DOB:1945/10/19, 76 y.o., male Today's Date: 05/23/2021  PCP: Inda Coke, PA REFERRING PROVIDER: Inda Coke, PA  PHYSICAL THERAPY DISCHARGE SUMMARY  Visits from Start of Care: 6  Current functional level related to goals / functional outcomes: See below   Remaining deficits: Continued strength and ROM deficits    Education / Equipment: See below   Patient agrees to discharge. Patient goals were partially met. Patient is being discharged due to being pleased with the current functional level.    PT End of Session - 05/23/21 1016     Visit Number 6    Number of Visits 12    Date for PT Re-Evaluation 06/19/21    Authorization Type medicare    Progress Note Due on Visit 10    PT Start Time 1018    PT Stop Time 1043    PT Time Calculation (min) 25 min             Past Medical History:  Diagnosis Date   Allergy    Seasonal   Aortic aneurysm (HCC)    Depression    DVT (deep venous thrombosis) ([place]) 2020   GERD (gastroesophageal reflux disease)    Goiter    Hyperlipidemia    Hypertension    Lymphoma ([place]) 7972   Follicular, stomach   Neuropathy of both feet    Past Surgical History:  Procedure Laterality Date   CARPAL TUNNEL RELEASE Left    CERVICAL DISCECTOMY  1995   C-7   CERVICAL FUSION  01/1989   C-3 through C-6 / no metal   COLONOSCOPY     ELBOW SURGERY Left    FOOT SURGERY Left 1999   Plantar fascitis   low back fusion  2013   Lumbar & Low Thoracic with metal   SHOULDER ARTHROSCOPY WITH ROTATOR CUFF REPAIR Right 2014   UPPER GASTROINTESTINAL ENDOSCOPY     Patient Active Problem List   Diagnosis Date Noted   Follicular lymphoma grade I of intra-abdominal lymph nodes ([place]) 03/07/2020   Hypertension 01/29/2020   Thoracic aortic aneurysm 01/29/2020   DVT R femoral thigh 01/29/2020   GERD (gastroesophageal reflux disease)  01/29/2020   History of Barrett's esophagus 01/29/2020   Goiter 01/29/2020   Neuropathy of both feet    Lymphoma ([place]) 2020   PCP: Inda Coke, PA   REFERRING PROVIDER: Gregor Hams, MD   REFERRING DIAG: G56.22 (ICD-10-CM) - Cubital tunnel syndrome on left   THERAPY DIAG:  Cervicalgia   Muscle weakness (generalized)   ONSET DATE: increased over the last year   SUBJECTIVE:  SUBJECTIVE STATEMENT: Reports he is doing his exercises every other day and overall feels about 45% better since start of PT. States he has less locking up of his left hand during the day   PERTINENT HISTORY:  hx of L carapl tunnel release and a L ulnar nerve transposition about 5-6 years ago, cervial fusion with metal with last one 2011 (total of 3 neck surgeries), bilateral foot neuropathy, vertig, RCR R > 5 years ago     PAIN:  Are you having pain? yes NPRS scale: 4/10 Pain location: left forearm Pain orientation: Right  PAIN TYPE: aching and annoying Pain description: intermittent  Aggravating factors: sleeping in weird position Relieving factors: heat   PRECAUTIONS: None   WEIGHT BEARING RESTRICTIONS No   FALLS:  Has patient fallen in last 6 months? 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 Postsurgical straightening of normal lordosis with anterior fusion C3 through C7. No hardware complication. 2. Multilevel facet hypertrophy. Bony neural foraminal stenosis appears more prominent on the left, at C4-C5, C5-C6, and C6-C7.          CERVICAL AROM/PROM   A/PROM A/PROM (deg) 05/23/2021  Flexion 18   Extension 30*  Right lateral flexion 14*  Left lateral flexion 8*  Right rotation 40*  Left rotation 48   (Blank rows = not tested) *pain on contralateral side    UE Measurements       Upper Extremity Right 05/23/2021 Left 05/23/2021    A/PROM MMT A/PROM MMT  Shoulder Flexion 160 4+ 155* 4*  Shoulder Extension          Shoulder Abduction       4+*  Shoulder Adduction          Shoulder Internal Rotation Reaches to L4 SP */30  4+ *right scapular inferior border  4*  Shoulder External Rotation right scapular superior border  4- Reaches to T2 SP  3+*  Elbow Flexion    5   4+  Elbow Extension   5    5  Wrist Flexion 60/75* 4+ 60/75* 4-  Wrist Extension 80/80 4+ 65/75 4  Wrist Supination 75 4+ 70/80 4-  Wrist Pronation   4+   4-  Wrist Ulnar Deviation          Wrist Radial Deviation          Grip Strength NA 55# NA 40#                      (Blank rows = not tested) *pain   CERVICAL SPECIAL TESTS:  Nerve tests negative bilaterally (median/radial and ulnar) Negative distraction and Spurling test       TODAY'S TREATMENT:   05/23/2021 Reviewed HEP   04/24/21 Therapeutic Exercise:           Aerobic: Supine: Prone:           Seated: supination stretch with 4# x10 Left, wrist stretch flexion and extension x5 10" holds Left, grip in neutral/supination/prontation with towel x5 " holds left           Standing:       PATIENT EDUCATION:  Education details: on current presentation, on HEP, on MMT/ROM and gains made Person educated: Patient Education method: Explanation, Demonstration, and Handouts Education comprehension: verbalized understanding  HOME EXERCISE PROGRAM: [id number]   ASSESSMENT:   CLINICAL IMPRESSION: Patient is a 76 y.o. male who was seen today for physical therapy evaluation and treatment for bilateral hand weakness. Objective impairments include decreased activity tolerance, decreased ROM, decreased strength, impaired UE functional use, and pain.  These impairments are limiting patient from cleaning and community activity. Personal factors including Age, Time since onset of injury/illness/exacerbation, 1 comorbidity: x3 cervical surgeries, 1-2 comorbidities: x2 wrist/elbow surgeries, and 3+ comorbidities: lumbar fusion  are also affecting patient's functional outcome. Patient will benefit from skilled PT to address above impairments and improve overall function.   05/23/2021 Patient returns to therapy and reports he would like to transition to HEP. Reviewed HEP and answered all questions. Retook UE measurements with improvements noted in all wrist measurements. Educated patient on in progress and importance of continued adherence to continue to work towards current goals. Session ended early secondary to patient request. Patient to discharge from PT to HEP at this time   REHAB POTENTIAL: Good   CLINICAL DECISION MAKING: Stable/uncomplicated   EVALUATION COMPLEXITY: Low     GOALS: Goals reviewed with patient? Yes   SHORT TERM GOALS:   STG Name Target Date Goal status  1 Patient will be independent in self management strategies to improve quality of life and functional outcomes. ]: every other day 05/22/2021  MET  2 Patient will report at least 50% improvement in overall symptoms and/or function to demonstrate improved functional mobility  45% 05/22/2021  PROGRESSING  3 Patient will be able to demonstrate at least 50# of left grip strength Baseline: 05/22/2021  PROGRESSING  4   Baseline:      5   Baseline:      6   Baseline:      7   Baseline:        LONG TERM GOALS:    LTG Name Target Date Goal status  1 Patient will report at least 75% improvement in overall symptoms and/or function to demonstrate improved functional mobility Baseline:45% 06/19/2021  PROGRESSING  2 Patient will demonstrate improved left UE strength with at least 4/5 MMT strength Baseline: 06/19/2021  PROGRESSING  3 Patient will report being able to type on  keyboard with improved dexterity without hitting wrong keys because of left pinky finger Baseline: 06/19/2021  NOT MET  4   Baseline:      5   Baseline:      6   Baseline:      7   Baseline:            PLAN: PT FREQUENCY: 1-2x/week for total of 12 visits   PT DURATION: 8 weeks   PLANNED INTERVENTIONS: Therapeutic exercises, Therapeutic activity, Neuro Muscular re-education, Balance training, Gait training, Patient/Family education, Joint mobilization, Dry Needling, Electrical stimulation, Cryotherapy, Moist heat, and Manual therapy   PLAN FOR NEXT SESSION: DC to HEP   10:47 AM, 05/23/21 Jerene Pitch, DPT Physical Therapy with [facility]  [phone] office

## 2021-05-25 ENCOUNTER — Encounter: Payer: Medicare Other | Admitting: Physical Therapy

## 2021-05-26 ENCOUNTER — Telehealth: Payer: Self-pay | Admitting: Hematology and Oncology

## 2021-05-26 NOTE — Telephone Encounter (Signed)
R/s per 2/24 in basket, pt has been called and confirmed new appt

## 2021-05-30 ENCOUNTER — Ambulatory Visit: Payer: Medicare Other | Admitting: Family Medicine

## 2021-06-05 ENCOUNTER — Inpatient Hospital Stay: Payer: Medicare Other | Attending: Hematology and Oncology

## 2021-06-05 ENCOUNTER — Other Ambulatory Visit: Payer: Self-pay

## 2021-06-05 ENCOUNTER — Other Ambulatory Visit: Payer: Self-pay | Admitting: Hematology and Oncology

## 2021-06-05 ENCOUNTER — Inpatient Hospital Stay (HOSPITAL_BASED_OUTPATIENT_CLINIC_OR_DEPARTMENT_OTHER): Payer: Medicare Other | Admitting: Hematology and Oncology

## 2021-06-05 VITALS — BP 137/82 | HR 54 | Temp 98.0°F | Resp 17 | Wt 210.0 lb

## 2021-06-05 DIAGNOSIS — Z7901 Long term (current) use of anticoagulants: Secondary | ICD-10-CM | POA: Insufficient documentation

## 2021-06-05 DIAGNOSIS — C8200 Follicular lymphoma grade I, unspecified site: Secondary | ICD-10-CM | POA: Insufficient documentation

## 2021-06-05 DIAGNOSIS — C8203 Follicular lymphoma grade I, intra-abdominal lymph nodes: Secondary | ICD-10-CM

## 2021-06-05 DIAGNOSIS — Z86718 Personal history of other venous thrombosis and embolism: Secondary | ICD-10-CM | POA: Diagnosis not present

## 2021-06-05 DIAGNOSIS — I82411 Acute embolism and thrombosis of right femoral vein: Secondary | ICD-10-CM

## 2021-06-05 LAB — CBC WITH DIFFERENTIAL (CANCER CENTER ONLY)
Abs Immature Granulocytes: 0.03 10*3/uL (ref 0.00–0.07)
Basophils Absolute: 0.1 10*3/uL (ref 0.0–0.1)
Basophils Relative: 1 %
Eosinophils Absolute: 0.2 10*3/uL (ref 0.0–0.5)
Eosinophils Relative: 4 %
HCT: 40.1 % (ref 39.0–52.0)
Hemoglobin: 13.6 g/dL (ref 13.0–17.0)
Immature Granulocytes: 1 %
Lymphocytes Relative: 31 %
Lymphs Abs: 1.8 10*3/uL (ref 0.7–4.0)
MCH: 31.9 pg (ref 26.0–34.0)
MCHC: 33.9 g/dL (ref 30.0–36.0)
MCV: 94.1 fL (ref 80.0–100.0)
Monocytes Absolute: 0.7 10*3/uL (ref 0.1–1.0)
Monocytes Relative: 12 %
Neutro Abs: 3 10*3/uL (ref 1.7–7.7)
Neutrophils Relative %: 51 %
Platelet Count: 204 10*3/uL (ref 150–400)
RBC: 4.26 MIL/uL (ref 4.22–5.81)
RDW: 12.8 % (ref 11.5–15.5)
WBC Count: 5.8 10*3/uL (ref 4.0–10.5)
nRBC: 0 % (ref 0.0–0.2)

## 2021-06-05 LAB — CMP (CANCER CENTER ONLY)
ALT: 22 U/L (ref 0–44)
AST: 18 U/L (ref 15–41)
Albumin: 4.1 g/dL (ref 3.5–5.0)
Alkaline Phosphatase: 73 U/L (ref 38–126)
Anion gap: 4 — ABNORMAL LOW (ref 5–15)
BUN: 21 mg/dL (ref 8–23)
CO2: 28 mmol/L (ref 22–32)
Calcium: 9.7 mg/dL (ref 8.9–10.3)
Chloride: 109 mmol/L (ref 98–111)
Creatinine: 1.08 mg/dL (ref 0.61–1.24)
GFR, Estimated: >60 mL/min (ref >60)
Glucose, Bld: 108 mg/dL — ABNORMAL HIGH (ref 70–99)
Potassium: 3.8 mmol/L (ref 3.5–5.1)
Sodium: 141 mmol/L (ref 135–145)
Total Bilirubin: 0.9 mg/dL (ref 0.3–1.2)
Total Protein: 6.6 g/dL (ref 6.5–8.1)

## 2021-06-05 LAB — LACTATE DEHYDROGENASE: LDH: 124 U/L (ref 98–192)

## 2021-06-05 NOTE — Progress Notes (Signed)
Taylor Berger [phone]   [phone]  PROGRESS NOTE  Patient Care Team: Inda Coke, [place] as PCP - General (Physician Assistant) Elouise Munroe, MD as PCP - Cardiology (Cardiology)  Hematological/Oncological History # Follicular Lymphoma Stage I Grade IA 1) 05/20/2019: CT scan showed stable mesenteric nodule, largest 4.2 cm and mild periaortic lymphadenopathy.  2) 06/04/2019: biopsy of abdominal lymph node showed grade 1 follicular lymphoma. Observation recommended by his oncologist at [facility] in [place], [place].  3) 03/14/2020: establish care with Dr. Lorenso Courier  4) 04/13/2020: CT scan showed stable mesenteric lymphadenopathy, measuring 3.9 x 1.9 cm. 5) 11/17/2020: presented to ED for abdominal discomfort. Underwent CT A/P and found to have relatively stable lymph node measuring 4.3 x 2.3 cm.   HISTORY OF PRESENTING ILLNESS:  Taylor Berger 76 y.o. male with medical history significant for HTN, HLD, DVT ,GERD, and follicular lymphoma (diagnosed 06/2019 and under observation) presents for a follow up visit for follicular lymphoma and recurrent RLE DVT.   On exam today Mr. Cass notes he has been well overall interim since her last visit.  He recently underwent cataract surgery on his eye and reports that his vision is markedly improved.  He notes that he did have a recent issue where he is always feeling cold and dresses in warm clothing within sometimes wakes up hot and sweaty.  He notes that he has been seen by endocrinology for this and there were no concerning findings at that time.  He reports that they are just continuing to monitor his goiter.  Otherwise he continues to take Xarelto 20 mg p.o. daily as he is trying to run out the supply before transitioning over to 10 mg p.o. daily.  He notes he will also discuss this with his cardiologist to assure they are okay with the lower dosing.  He denies any lymphadenopathy weight loss or  changes in appetite.  He denies any bleeding, bruising, or dark stools.  He denies any fevers, chills, sweats, nausea vomiting or diarrhea.  Otherwise a full point ROS is listed below.   MEDICAL HISTORY:  Past Medical History:  Diagnosis Date   Allergy    Seasonal   Aortic aneurysm (HCC)    Depression    DVT (deep venous thrombosis) ([place]) 2020   GERD (gastroesophageal reflux disease)    Goiter    Hyperlipidemia    Hypertension    Lymphoma ([place]) 6629   Follicular, stomach   Neuropathy of both feet     SURGICAL HISTORY: Past Surgical History:  Procedure Laterality Date   CARPAL TUNNEL RELEASE Left    CERVICAL DISCECTOMY  1995   C-7   CERVICAL FUSION  01/1989   C-3 through C-6 / no metal   COLONOSCOPY     ELBOW SURGERY Left    FOOT SURGERY Left 1999   Plantar fascitis   low back fusion  2013   Lumbar & Low Thoracic with metal   SHOULDER ARTHROSCOPY WITH ROTATOR CUFF REPAIR Right 2014   UPPER GASTROINTESTINAL ENDOSCOPY      SOCIAL HISTORY: Social History   Socioeconomic History   Marital status: Married    Spouse name: Not on file   Number of children: Not on file   Years of education: Not on file   Highest education level: Not on file  Occupational History   Occupation: retired   Tobacco Use   Smoking status: Never   Smokeless tobacco: Never  Vaping Use   Vaping  Use: Never used  Substance and Sexual Activity   Alcohol use: Not Currently   Drug use: Never   Sexual activity: Not Currently  Other Topics Concern   Not on file  Social History Narrative   ** Merged History Encounter **       Moved from [facility] to be closer to son Married   Social Determinants of Radio broadcast assistant Strain: Low Risk    Difficulty of Paying Living Expenses: Not hard at all  Food Insecurity: No Food Insecurity   Worried About Charity fundraiser in the Last Year: Never true   Arboriculturist in the Last Year: Never true  Transportation Needs: No Transportation Needs    Lack of Transportation (Medical): No   Lack of Transportation (Non-Medical): No  Physical Activity: Inactive   Days of Exercise per Week: 0 days   Minutes of Exercise per Session: 0 min  Stress: No Stress Concern Present   Feeling of Stress : Not at all  Social Connections: Moderately Isolated   Frequency of Communication with Friends and Family: Once a week   Frequency of Social Gatherings with Friends and Family: Twice a week   Attends Religious Services: Never   Printmaker: No   Attends Music therapist: Never   Marital Status: Married  Human resources officer Violence: Not At Risk   Fear of Current or Ex-Partner: No   Emotionally Abused: No   Physically Abused: No   Sexually Abused: No    FAMILY HISTORY: Family History  Problem Relation Age of Onset   Diabetes Mother    Hypertension Mother    Hyperlipidemia Mother    Heart failure Mother    Leukemia Father    Stroke Father    Esophageal cancer Neg Hx    Colon polyps Neg Hx    Pancreatic cancer Neg Hx    Stomach cancer Neg Hx    Rectal cancer Neg Hx     ALLERGIES:  has No Known Allergies.  MEDICATIONS:  Current Outpatient Medications  Medication Sig Dispense Refill   amLODipine (NORVASC) 10 MG tablet Take 1 tablet by mouth once daily 90 tablet 0   atorvastatin (LIPITOR) 40 MG tablet Take 1 tablet by mouth once daily 90 tablet 0   cetirizine (ZYRTEC) 10 MG tablet Take 10 mg by mouth daily.     clindamycin (CLEOCIN-T) 1 % external solution Apply topically 2 (two) times daily. (Patient taking differently: Apply topically as needed.) 30 mL 0   esomeprazole (NEXIUM) 20 MG capsule Take 20 mg by mouth daily at 12 noon.     fluticasone (FLONASE) 50 MCG/ACT nasal spray Place into both nostrils as needed for allergies or rhinitis.     lisinopril (ZESTRIL) 5 MG tablet Take 1 tablet by mouth once daily 90 tablet 0   ofloxacin (OCUFLOX) 0.3 % ophthalmic solution  (Patient not taking: Reported  on 05/19/2021)     prednisoLONE acetate (PRED FORTE) 1 % ophthalmic suspension  (Patient not taking: Reported on 05/19/2021)     rivaroxaban (XARELTO) 10 MG TABS tablet Take 1 tablet (10 mg total) by mouth daily. (Patient taking differently: Take 20 mg by mouth daily.) 90 tablet 2   valACYclovir (VALTREX) 500 MG tablet Take 1 tablet (500 mg total) by mouth daily. 90 tablet 3   No current facility-administered medications for this visit.    REVIEW OF SYSTEMS:   Constitutional: ( - ) fevers, ( - )  chills , ( - ) night sweats Eyes: ( - ) blurriness of vision, ( - ) double vision, ( - ) watery eyes Ears, nose, mouth, throat, and face: ( - ) mucositis, ( - ) sore throat Respiratory: ( - ) cough, ( - ) dyspnea, ( - ) wheezes Cardiovascular: ( - ) palpitation, ( - ) chest discomfort, ( - ) lower extremity swelling Gastrointestinal:  ( - ) nausea, ( - ) heartburn, ( - ) change in bowel habits Skin: ( - ) abnormal skin rashes Lymphatics: ( - ) new lymphadenopathy, ( - ) easy bruising Neurological: ( - ) tingling, ( - ) new weaknesses. + chronic neuropathy in lower extremities Behavioral/Psych: ( - ) mood change, ( - ) new changes  All other systems were reviewed with the patient and are negative.  PHYSICAL EXAMINATION: ECOG PERFORMANCE STATUS: 0 - Asymptomatic  Vitals:   06/05/21 0941  BP: 137/82  Pulse: (!) 54  Resp: 17  Temp: 98 F (36.7 C)  SpO2: 98%    Filed Weights   06/05/21 0941  Weight: 210 lb (95.3 kg)     GENERAL: well appearing elderly Caucasian male, appears younger than stated age, in NAD  SKIN: skin color, texture, turgor are normal, no rashes or significant lesions EYES: conjunctiva are pink and non-injected, sclera clear LUNGS: clear to auscultation and percussion with normal breathing effort HEART: regular rate & rhythm and no murmurs. No LE edema or erythema. Musculoskeletal: no cyanosis of digits and no clubbing  PSYCH: alert & oriented x 3, fluent  speech NEURO: no focal motor/sensory deficits  LABORATORY DATA:  I have reviewed the data as listed CBC Latest Ref Rng & Units 06/05/2021 02/20/2021 12/07/2020  WBC 4.0 - 10.5 K/uL 5.8 6.5 7.7  Hemoglobin 13.0 - 17.0 g/dL 13.6 13.5 14.1  Hematocrit 39.0 - 52.0 % 40.1 40.2 42.2  Platelets 150 - 400 K/uL 204 216.0 222    CMP Latest Ref Rng & Units 06/05/2021 02/20/2021 12/07/2020  Glucose 70 - 99 mg/dL 108(H) 151(H) 174(H)  BUN 8 - 23 mg/dL '21 19 22  ' Creatinine 0.61 - 1.24 mg/dL 1.08 1.03 1.23  Sodium 135 - 145 mmol/L 141 140 142  Potassium 3.5 - 5.1 mmol/L 3.8 3.6 4.1  Chloride 98 - 111 mmol/L 109 105 107  CO2 22 - 32 mmol/L '28 28 24  ' Calcium 8.9 - 10.3 mg/dL 9.7 9.4 10.2  Total Protein 6.5 - 8.1 g/dL 6.6 6.4 7.0  Total Bilirubin 0.3 - 1.2 mg/dL 0.9 0.7 1.0  Alkaline Phos 38 - 126 U/L 73 70 77  AST 15 - 41 U/L '18 19 19  ' ALT 0 - 44 U/L '22 23 23     ' PATHOLOGY:     RADIOGRAPHIC STUDIES: I have personally reviewed the radiological images as listed and agreed with the findings in the report. No results found.   ASSESSMENT & PLAN Yisrael Obryan 76 y.o. male with medical history significant for HTN, HLD, DVT ,GERD, and follicular lymphoma (diagnosed 06/2019 and under observation) presents to establish care with oncology for his follicular lymphoma.  After review the labs, review the records, discussion with the patient the findings are most consistent with a low-grade stage I follicular lymphoma of the lymph node of the abdomen.  At this time there is no indication for treatment as there is no bulky disease, no B symptoms, and no cytopenias.  We can continue to follow this patient in clinic with lab work and clinical follow-up.   #  Grade IA Follicular Lymphoma --diagnosed in [place] in March 2021. Biopsy confirms Grade IA follicular lymphoma with a Ki-67 of 5-10%. Currently on observation.  --Reviewed labs from today without any intervention needed. CT abdomen/pelvis from 04/13/20 revealed  stable mesenteric lymphadenopathy. Recommendation is to continue with observation and return in 6 months with lab work. Last CT scan showed stable disease, no indication for repeat imaging. Plan: -- Baseline labs to include CBC, CMP, LDH each visit --continue monitoring with q 6 month clinic visits and labs  #DVT x 2 episodes.  --Initially diagnosed with DVT of the right femoral through popliteal veins on 12/10/2018. Treated with 6 months of anticoagulation therapy. At initial visit, discussed the risks of recurrent VTE and the recommendation for indefinite anticoagulation as it was likely an unprovoked event. Patient elected to continue with observation for recurrent clot. --On 03/30/20, patient had another DVT of the right lower extremity. Doppler US was obtained that revealed acute DVT in the right gastrocnemius veins, right intramuscular calf vein. In addition, there were two pairs of acutely thrombosed veins appear to arise from the popliteal vein and are thrombosed to the mid calf. No acute DVT in the popliteal vein. There was evidence of chronic deep vein thrombosis involving the right femoral vein, and popliteal vein. Likely provoked by recent episode of diverticulitis. Patient was started on Xarelto 79m PO daily.  --Discussed the recommendation for indefinite anticoagulation due to recurrent episodes of DVTs, with initial episode that was likely unprovoked. Patient was in agreement with this recommendation.  Plan:  --transition to maintenance Xaretlo 10 mg PO daily after completion of his 20 mg p.o. daily supply. Will continue indefinitely.  --RTC in 6 months time  #Health maintenance --Patient follow with GI for Barrett's esophagus.  --Patient is up to date on COVID and influenza vaccinations.   #Right mid thyroid nodule --Underwent fine needle aspiration on 05/31/20, results show benign tissue.   No orders of the defined types were placed in this encounter.   All questions were  answered. The patient knows to call the clinic with any problems, questions or concerns.  JLedell Peoples MD Department of Hematology/Oncology [place] LLCPhone: [phone]: [phone]: [email or web address]  06/05/2021 9:55 AM

## 2021-06-06 ENCOUNTER — Other Ambulatory Visit: Payer: Medicare Other

## 2021-06-06 ENCOUNTER — Ambulatory Visit: Payer: Medicare Other | Admitting: Hematology and Oncology

## 2021-06-07 NOTE — Progress Notes (Signed)
? ? ?Office Visit  ?  ?Patient Name: Taylor Berger ?Date of Encounter: 06/08/2021 ? ?Primary Care Provider:  Inda Coke, PA ?Primary Cardiologist:  Elouise Munroe, MD ? ?Chief Complaint  ?  ?76 year old male with a history of CAD, thoracic aortic aneurysm, hypertension, hyperlipidemia, DVT, follicular lymphoma, goiter, depression, and peripheral neuropathy who presents for follow-up related to CAD. ? ?Past Medical History  ?  ?Past Medical History:  ?Diagnosis Date  ? Allergy   ? Seasonal  ? Aortic aneurysm ([place])   ? Depression   ? DVT (deep venous thrombosis) ([place]) 2020  ? GERD (gastroesophageal reflux disease)   ? Goiter   ? Hyperlipidemia   ? Hypertension   ? Lymphoma ([place]) 2020  ? Follicular, stomach  ? Neuropathy of both feet   ? ?Past Surgical History:  ?Procedure Laterality Date  ? CARPAL TUNNEL RELEASE Left   ? CERVICAL DISCECTOMY  1995  ? C-7  ? CERVICAL FUSION  01/1989  ? C-3 through C-6 / no metal  ? COLONOSCOPY    ? ELBOW SURGERY Left   ? FOOT SURGERY Left 1999  ? Plantar fascitis  ? low back fusion  2013  ? Lumbar & Low Thoracic with metal  ? SHOULDER ARTHROSCOPY WITH ROTATOR CUFF REPAIR Right 2014  ? UPPER GASTROINTESTINAL ENDOSCOPY    ? ? ?Allergies ? ?No Known Allergies ? ?History of Present Illness  ?  ?76 year old male with the above past medical history including CAD, thoracic aortic aneurysm, hypertension, hyperlipidemia, DVT, follicular lymphoma, goiter, depression, and peripheral neuropathy. ? ?He has a history of thoracic aortic aneurysm measuring 4.2 cm per CT in February 2020. He was initially evaluated by Dr. Margaretann Loveless in November 2021 and reported occasional non-radiating substernal chest discomfort.  He underwent coronary CTA in January 2022 which showed moderate coronary disease.  CT FFR showed no hemodynamically significant stenosis and medical management was recommended.  He was noted to have borderline dilation of his aorta measuring 39 mm, moderate dilation of his pulmonary  artery, suspicious for pulmonary hypertension.  Follow-up echocardiogram January 2022 showed EF 60 to 65%, G1 DD, mild LVH, mild mitral valve regurgitation, no evidence of pulmonary hypertension. CT of the abdomen pelvis in January 2022 without mention of previously documented thoracic aortic aneurysm. ? ?He was last seen in the office on 11/10/2020 and was stable from a cardiac standpoint.  He did report occasional back and intermittent chest aches related to lumbar and cervical spine disease.  He presents today for follow-up.  Since his last visit has done well from a cardiac standpoint.  He denies any symptoms concerning for angina, denies dyspnea.  He states he thinks he suffers from seasonal affective disease and as such it has been difficult for him to maintain his physical activity over the winter months, however, he is gradually increasing his activity and recently joined a gym. Overall, he reports feeling well and denies any specific concerns or complaints today. ? ?Home Medications  ?  ?Current Outpatient Medications  ?Medication Sig Dispense Refill  ? amLODipine (NORVASC) 10 MG tablet Take 1 tablet by mouth once daily 90 tablet 0  ? atorvastatin (LIPITOR) 40 MG tablet Take 1 tablet by mouth once daily 90 tablet 0  ? cetirizine (ZYRTEC) 10 MG tablet Take 10 mg by mouth daily.    ? clindamycin (CLEOCIN-T) 1 % external solution Apply topically 2 (two) times daily. (Patient taking differently: Apply topically as needed.) 30 mL 0  ? esomeprazole (NEXIUM) 20 MG  capsule Take 20 mg by mouth daily at 12 noon.    ? fluticasone (FLONASE) 50 MCG/ACT nasal spray Place into both nostrils as needed for allergies or rhinitis.    ? lisinopril (ZESTRIL) 5 MG tablet Take 1 tablet by mouth once daily 90 tablet 0  ? ofloxacin (OCUFLOX) 0.3 % ophthalmic solution     ? prednisoLONE acetate (PRED FORTE) 1 % ophthalmic suspension     ? rivaroxaban (XARELTO) 10 MG TABS tablet Take 1 tablet (10 mg total) by mouth daily. (Patient  taking differently: Take 20 mg by mouth daily.) 90 tablet 2  ? valACYclovir (VALTREX) 500 MG tablet Take 1 tablet (500 mg total) by mouth daily. 90 tablet 3  ? ?No current facility-administered medications for this visit.  ?  ? ?Review of Systems  ?  ?He denies chest pain, palpitations, dyspnea, pnd, orthopnea, n, v, dizziness, syncope, edema, weight gain, or early satiety. All other systems reviewed and are otherwise negative except as noted above.  ? ?Physical Exam  ?  ?VS:  BP 116/70 (BP Location: Left Arm, Patient Position: Sitting, Cuff Size: Normal)   Pulse (!) 49   Ht '5\' 8"'$  (1.727 m)   Wt 208 lb 9.6 oz (94.6 kg)   SpO2 92%   BMI 31.72 kg/m?  ?GEN: Well nourished, well developed, in no acute distress. ?HEENT: normal. ?Neck: Supple, no JVD, carotid bruits, or masses. ?Cardiac: RRR, no murmurs, rubs, or gallops. No clubbing, cyanosis, edema.  Radials/DP/PT 2+ and equal bilaterally.  ?Respiratory:  Respirations regular and unlabored, clear to auscultation bilaterally. ?GI: Soft, nontender, nondistended, BS + x 4. ?MS: no deformity or atrophy. ?Skin: warm and dry, no rash. ?Neuro:  Strength and sensation are intact. ?Psych: Normal affect. ? ?Accessory Clinical Findings  ?  ?ECG personally reviewed by me today - Sinus bradycardia, 49 bpm, 1st degree AV block, LAD  - no acute changes. ? ?Lab Results  ?Component Value Date  ? WBC 5.8 06/05/2021  ? HGB 13.6 06/05/2021  ? HCT 40.1 06/05/2021  ? MCV 94.1 06/05/2021  ? PLT 204 06/05/2021  ? ?Lab Results  ?Component Value Date  ? CREATININE 1.08 06/05/2021  ? BUN 21 06/05/2021  ? NA 141 06/05/2021  ? K 3.8 06/05/2021  ? CL 109 06/05/2021  ? CO2 28 06/05/2021  ? ?Lab Results  ?Component Value Date  ? ALT 22 06/05/2021  ? AST 18 06/05/2021  ? ALKPHOS 73 06/05/2021  ? BILITOT 0.9 06/05/2021  ? ?Lab Results  ?Component Value Date  ? CHOL 155 11/23/2020  ? HDL 40.20 11/23/2020  ? New [place] 87 11/23/2020  ? TRIG 138.0 11/23/2020  ? CHOLHDL 4 11/23/2020  ?  ?Lab Results   ?Component Value Date  ? HGBA1C 6.0 11/23/2020  ? ? ?Assessment & Plan  ?  ?1. Nonobstrictive CAD: Coronary CTA in January 2022 showed moderate coronary disease.  CT FFR showed no hemodynamically significant stenosis, medical management recommended. Stable with no anginal symptoms. No indication for ischemic evaluation at this time.  Continue amlodipine, lisinopril, and Lipitor. ? ?2. Sinus bradycardia/1st degree AV block: EKG today shows sinus bradycardia, first-degree AV block, 49 bpm.  Asymptomatic, not on any AV nodal blocking agents.  Stable. ? ?3. Hypertension: BP well controlled. Continue current antihypertensive regimen.  ? ?4. Hyperlipidemia: LDL was 87 in 10/2020. Monitored and managed by PCP.  Goal LDL less than 70, if LDL remains above goal, consider escalation of statin therapy.  For now, continue atorvastatin.  ? ?5.  Mitral valve regurgitation: Most recent echo in  January 2022 showed EF 60 to 65%, G1 DD, mild LVH, mild mitral valve regurgitation. Asymptomatic. Consider repeat echo 2-3 years or sooner if he develops symptoms.  ? ?6. Thoracic aortic aneurysm/borderline dilation of ascending aorta: He has a history of thoracic aortic aneurysm measuring 4.2 cm per CT in February 2020. CT of the abdomen pelvis in August 2022 without mention of thoracic aortic aneurysm.  Coronary CTA in January 2022 showed dilation of ascending aorta, 39 mm.  He has serial CTs abdomen/pelvis per oncology in the setting of follicular lymphoma. No indication for repeat CT abdomen pelvis at this time.  BP well controlled.  Could consider CT chest in the future for further monitoring of dilated ascending aorta.  ? ?7. H/o recurrent DVT: History of recurrent DVT, on chronic Xarelto. Denies bleeding Stable.  ? ?8. Disposition: Follow-up in 6 months. ? ? ?Lenna Sciara, NP ?06/08/2021, 12:43 PM ?  ? ?

## 2021-06-08 ENCOUNTER — Encounter: Payer: Self-pay | Admitting: Nurse Practitioner

## 2021-06-08 ENCOUNTER — Ambulatory Visit (INDEPENDENT_AMBULATORY_CARE_PROVIDER_SITE_OTHER): Payer: Medicare Other | Admitting: Nurse Practitioner

## 2021-06-08 ENCOUNTER — Other Ambulatory Visit: Payer: Self-pay

## 2021-06-08 VITALS — BP 116/70 | HR 49 | Ht 68.0 in | Wt 208.6 lb

## 2021-06-08 DIAGNOSIS — R001 Bradycardia, unspecified: Secondary | ICD-10-CM

## 2021-06-08 DIAGNOSIS — I2510 Atherosclerotic heart disease of native coronary artery without angina pectoris: Secondary | ICD-10-CM | POA: Diagnosis not present

## 2021-06-08 DIAGNOSIS — I440 Atrioventricular block, first degree: Secondary | ICD-10-CM

## 2021-06-08 DIAGNOSIS — I10 Essential (primary) hypertension: Secondary | ICD-10-CM

## 2021-06-08 DIAGNOSIS — I712 Thoracic aortic aneurysm, without rupture, unspecified: Secondary | ICD-10-CM

## 2021-06-08 DIAGNOSIS — I340 Nonrheumatic mitral (valve) insufficiency: Secondary | ICD-10-CM

## 2021-06-08 DIAGNOSIS — E785 Hyperlipidemia, unspecified: Secondary | ICD-10-CM | POA: Diagnosis not present

## 2021-06-08 NOTE — Patient Instructions (Addendum)
Medication Instructions:  ?Your physician recommends that you continue on your current medications as directed. Please refer to the Current Medication list given to you today.  ? ?*If you need a refill on your cardiac medications before your next appointment, please call your pharmacy* ? ? ?Lab Work: ?NONE ordered at this time of appointment  ? ?If you have labs (blood work) drawn today and your tests are completely normal, you will receive your results only by: ?MyChart Message (if you have MyChart) OR ?A paper copy in the mail ?If you have any lab test that is abnormal or we need to change your treatment, we will call you to review the results. ? ? ?Testing/Procedures: ?NONE ordered at this time of appointment  ? ? ?Follow-Up: ?At [facility], you and your health needs are our priority.  As part of our continuing mission to provide you with exceptional heart care, we have created designated Provider Care Teams.  These Care Teams include your primary Cardiologist (physician) and Advanced Practice Providers (APPs -  Physician Assistants and Nurse Practitioners) who all work together to provide you with the care you need, when you need it. ? ?We recommend signing up for the patient portal called "MyChart".  Sign up information is provided on this After Visit Summary.  MyChart is used to connect with patients for Virtual Visits (Telemedicine).  Patients are able to view lab/test results, encounter notes, upcoming appointments, etc.  Non-urgent messages can be sent to your provider as well.   ?To learn more about what you can do with MyChart, go to [email or web address].   ? ?Your next appointment:   ?6 month(s) ? ?The format for your next appointment:   ?In Person ? ?Provider:   ?Elouise Munroe, MD   ? ? ? ? ? ?

## 2021-07-27 ENCOUNTER — Encounter: Payer: Self-pay | Admitting: Physician Assistant

## 2021-07-27 DIAGNOSIS — H9193 Unspecified hearing loss, bilateral: Secondary | ICD-10-CM

## 2021-08-01 ENCOUNTER — Encounter: Payer: Self-pay | Admitting: Physician Assistant

## 2021-08-01 ENCOUNTER — Encounter (HOSPITAL_BASED_OUTPATIENT_CLINIC_OR_DEPARTMENT_OTHER): Payer: Self-pay

## 2021-08-01 ENCOUNTER — Ambulatory Visit (INDEPENDENT_AMBULATORY_CARE_PROVIDER_SITE_OTHER): Payer: Medicare Other | Admitting: Physician Assistant

## 2021-08-01 ENCOUNTER — Ambulatory Visit (HOSPITAL_BASED_OUTPATIENT_CLINIC_OR_DEPARTMENT_OTHER)
Admission: RE | Admit: 2021-08-01 | Discharge: 2021-08-01 | Disposition: A | Discharge status: Home / Self Care | Payer: Medicare Other | Source: Ambulatory Visit | Attending: Physician Assistant | Admitting: Physician Assistant

## 2021-08-01 ENCOUNTER — Ambulatory Visit (HOSPITAL_BASED_OUTPATIENT_CLINIC_OR_DEPARTMENT_OTHER): Payer: Medicare Other

## 2021-08-01 ENCOUNTER — Telehealth: Payer: Self-pay | Admitting: Physician Assistant

## 2021-08-01 VITALS — BP 110/66 | HR 68 | Temp 98.5°F | Ht 68.0 in | Wt 207.2 lb

## 2021-08-01 DIAGNOSIS — R1032 Left lower quadrant pain: Secondary | ICD-10-CM | POA: Diagnosis present

## 2021-08-01 LAB — CBC WITH DIFFERENTIAL/PLATELET
Basophils Absolute: 0.1 10*3/uL (ref 0.0–0.1)
Basophils Relative: 0.7 % (ref 0.0–3.0)
Eosinophils Absolute: 0.2 10*3/uL (ref 0.0–0.7)
Eosinophils Relative: 3.1 % (ref 0.0–5.0)
HCT: 41.2 % (ref 39.0–52.0)
Hemoglobin: 14 g/dL (ref 13.0–17.0)
Lymphocytes Relative: 25 % (ref 12.0–46.0)
Lymphs Abs: 1.9 10*3/uL (ref 0.7–4.0)
MCHC: 34 g/dL (ref 30.0–36.0)
MCV: 95.6 fl (ref 78.0–100.0)
Monocytes Absolute: 0.7 10*3/uL (ref 0.1–1.0)
Monocytes Relative: 9.7 % (ref 3.0–12.0)
Neutro Abs: 4.7 10*3/uL (ref 1.4–7.7)
Neutrophils Relative %: 61.5 % (ref 43.0–77.0)
Platelets: 223 10*3/uL (ref 150.0–400.0)
RBC: 4.31 Mil/uL (ref 4.22–5.81)
RDW: 13.2 % (ref 11.5–15.5)
WBC: 7.7 10*3/uL (ref 4.0–10.5)

## 2021-08-01 LAB — URINALYSIS, ROUTINE W REFLEX MICROSCOPIC
Bilirubin Urine: NEGATIVE
Hgb urine dipstick: NEGATIVE
Ketones, ur: NEGATIVE
Leukocytes,Ua: NEGATIVE
Nitrite: NEGATIVE
RBC / HPF: NONE SEEN (ref 0–?)
Specific Gravity, Urine: 1.025 (ref 1.000–1.030)
Total Protein, Urine: NEGATIVE
Urine Glucose: NEGATIVE
Urobilinogen, UA: 0.2 (ref 0.0–1.0)
pH: 5.5 (ref 5.0–8.0)

## 2021-08-01 LAB — COMPREHENSIVE METABOLIC PANEL
ALT: 19 U/L (ref 0–53)
AST: 15 U/L (ref 0–37)
Albumin: 4.2 g/dL (ref 3.5–5.2)
Alkaline Phosphatase: 79 U/L (ref 39–117)
BUN: 19 mg/dL (ref 6–23)
CO2: 27 mEq/L (ref 19–32)
Calcium: 9.7 mg/dL (ref 8.4–10.5)
Chloride: 108 mEq/L (ref 96–112)
Creatinine, Ser: 1.33 mg/dL (ref 0.40–1.50)
GFR: 51.6 mL/min — ABNORMAL LOW (ref >60.00)
Glucose, Bld: 124 mg/dL — ABNORMAL HIGH (ref 70–99)
Potassium: 4 mEq/L (ref 3.5–5.1)
Sodium: 141 mEq/L (ref 135–145)
Total Bilirubin: 0.7 mg/dL (ref 0.2–1.2)
Total Protein: 6.6 g/dL (ref 6.0–8.3)

## 2021-08-01 LAB — C-REACTIVE PROTEIN: CRP: <1 mg/dL (ref 0.5–20.0)

## 2021-08-01 LAB — POCT I-STAT CREATININE: Creatinine, Ser: 1.1 mg/dL (ref 0.61–1.24)

## 2021-08-01 LAB — SEDIMENTATION RATE: Sed Rate: 20 mm/hr (ref 0–20)

## 2021-08-01 MED ORDER — IOHEXOL 300 MG/ML  SOLN
100.0000 mL | Freq: Once | INTRAMUSCULAR | Status: AC | PRN
  Administered 2021-08-01: 85 mL via INTRAVENOUS

## 2021-08-01 NOTE — Telephone Encounter (Signed)
[facility] radiology called - CT abdomen is final and ready for review -  ?

## 2021-08-01 NOTE — Telephone Encounter (Signed)
Taylor Berger already aware. ?

## 2021-08-01 NOTE — Progress Notes (Signed)
Taylor Berger is a 76 y.o. male here for abdominal pain. ? ?History of Present Illness:  ? ?Chief Complaint  ?Patient presents with  ? Abdominal Pain  ?  Pt c/o left lower quadrant abdominal pain x 5-7 days, constant dull pain, sharp when bends over. When he strains to have a bowel movement he has pain on left lower side. Denies fever or chills.  ? ?LLQ Abdominal Pain  ?Drago presents with c/o left lower quadrant abdominal pain that has been onset for 5-7 days. Pt describes this to be a constant dull pain that transitions into a sharp sensation when he bends over or bears down while having a BM. Pt rates the pain to be a 5-6 out of 10 and also shares that the tylenol he took for pain didn't provide much relief. Due to belief that this could be caused by some constipation, he started taking a daily metamucil capsule as well an OTC probiotic and laxative to help with this. Reports that following this he found he didn't have to strain as much. Currently he expresses that his pain has improved over time, but he decided to have this further evaluated to be sure nothing significant is occurring. Pt does have a hx of multiple diverticula found in 2022 colonoscopy.  He was treated empirically for diverticulitis in 2021. ? ?Denies recent trauma, bulging, hematochezia, diarrhea, melena, decreased appetite, dysuria, hematuria, urinary frequency, or urinary urgency apart from baseline.  ? ?Past Medical History:  ?Diagnosis Date  ? Allergy   ? Seasonal  ? Aortic aneurysm ([place])   ? Depression   ? DVT (deep venous thrombosis) ([place]) 2020  ? GERD (gastroesophageal reflux disease)   ? Goiter   ? Hyperlipidemia   ? Hypertension   ? Lymphoma ([place]) 2020  ? Follicular, stomach  ? Neuropathy of both feet   ? ?  ?Social History  ? ?Tobacco Use  ? Smoking status: Never  ? Smokeless tobacco: Never  ?Vaping Use  ? Vaping Use: Never used  ?Substance Use Topics  ? Alcohol use: Not Currently  ? Drug use: Never  ? ? ?Past Surgical History:   ?Procedure Laterality Date  ? CARPAL TUNNEL RELEASE Left   ? CERVICAL DISCECTOMY  1995  ? C-7  ? CERVICAL FUSION  01/1989  ? C-3 through C-6 / no metal  ? COLONOSCOPY    ? ELBOW SURGERY Left   ? FOOT SURGERY Left 1999  ? Plantar fascitis  ? low back fusion  2013  ? Lumbar & Low Thoracic with metal  ? SHOULDER ARTHROSCOPY WITH ROTATOR CUFF REPAIR Right 2014  ? UPPER GASTROINTESTINAL ENDOSCOPY    ? ? ?Family History  ?Problem Relation Age of Onset  ? Diabetes Mother   ? Hypertension Mother   ? Hyperlipidemia Mother   ? Heart failure Mother   ? Leukemia Father   ? Stroke Father   ? Esophageal cancer Neg Hx   ? Colon polyps Neg Hx   ? Pancreatic cancer Neg Hx   ? Stomach cancer Neg Hx   ? Rectal cancer Neg Hx   ? ? ?No Known Allergies ? ?Current Medications:  ? ?Current Outpatient Medications:  ?  amLODipine (NORVASC) 10 MG tablet, Take 1 tablet by mouth once daily, Disp: 90 tablet, Rfl: 0 ?  atorvastatin (LIPITOR) 40 MG tablet, Take 1 tablet by mouth once daily, Disp: 90 tablet, Rfl: 0 ?  cetirizine (ZYRTEC) 10 MG tablet, Take 10 mg by mouth daily., Disp: , Rfl:  ?  clindamycin (CLEOCIN-T) 1 % external solution, Apply topically 2 (two) times daily. (Patient taking differently: Apply topically as needed.), Disp: 30 mL, Rfl: 0 ?  esomeprazole (NEXIUM) 20 MG capsule, Take 20 mg by mouth daily at 12 noon., Disp: , Rfl:  ?  fluticasone (FLONASE) 50 MCG/ACT nasal spray, Place into both nostrils as needed for allergies or rhinitis., Disp: , Rfl:  ?  lisinopril (ZESTRIL) 5 MG tablet, Take 1 tablet by mouth once daily, Disp: 90 tablet, Rfl: 0 ?  rivaroxaban (XARELTO) 10 MG TABS tablet, Take 1 tablet (10 mg total) by mouth daily. (Patient taking differently: Take 20 mg by mouth daily.), Disp: 90 tablet, Rfl: 2 ?  valACYclovir (VALTREX) 500 MG tablet, Take 1 tablet (500 mg total) by mouth daily., Disp: 90 tablet, Rfl: 3  ? ?Review of Systems:  ? ?Review of Systems  ?Gastrointestinal:  Positive for abdominal pain.  ? ?Vitals:   ? ?Vitals:  ? 08/01/21 0928  ?BP: 110/66  ?Pulse: 68  ?Temp: 98.5 ?F (36.9 ?C)  ?TempSrc: Temporal  ?SpO2: 94%  ?Weight: 207 lb 4 oz (94 kg)  ?Height: '5\' 8"'$  (1.727 m)  ?   ?Body mass index is 31.51 kg/m?. ? ?Physical Exam:  ? ?Physical Exam ?Vitals and nursing note reviewed.  ?Constitutional:   ?   General: He is not in acute distress. ?   Appearance: He is well-developed. He is not ill-appearing or toxic-appearing.  ?Cardiovascular:  ?   Rate and Rhythm: Normal rate and regular rhythm.  ?   Pulses: Normal pulses.  ?   Heart sounds: Normal heart sounds, S1 normal and S2 normal.  ?Pulmonary:  ?   Effort: Pulmonary effort is normal.  ?   Breath sounds: Normal breath sounds.  ?Abdominal:  ?   General: Abdomen is flat. Bowel sounds are normal.  ?   Palpations: Abdomen is soft.  ?   Tenderness: There is abdominal tenderness in the left lower quadrant. There is no right CVA tenderness, left CVA tenderness, guarding or rebound.  ?   Hernia: No hernia is present.  ?Skin: ?   General: Skin is warm and dry.  ?Neurological:  ?   Mental Status: He is alert.  ?   GCS: GCS eye subscore is 4. GCS verbal subscore is 5. GCS motor subscore is 6.  ?Psychiatric:     ?   Speech: Speech normal.     ?   Behavior: Behavior normal. Behavior is cooperative.  ? ? ?Assessment and Plan:  ? ?LLQ pain ?Uncontrolled ?No red flags or evidence of acute abdomen on my exam ?Suspect diverticulitis ?Due to significant past medical history, he is agreeable to obtaining CT abd/pel and blood work today for further evaluation and to determine next steps and treatment if indicated based on results ?If worsening symptoms, recommend ER evaluation ? ?I,Havlyn C Ratchford,acting as a scribe for [employer], PA.,have documented all relevant documentation on the behalf of Inda Coke, PA,as directed by  Inda Coke, PA while in the presence of Inda Coke, [place]. ? ?IInda Coke, PA, have reviewed all documentation for this visit. The  documentation on 08/01/21 for the exam, diagnosis, procedures, and orders are all accurate and complete. ? ? ?Inda Coke, PA-C ? ?

## 2021-08-01 NOTE — Patient Instructions (Signed)
It was great to see you! ? ?Please get CT scan ? ?We will be in touch with results ? ?Take care, ? ?Inda Coke PA-C  ?

## 2021-08-02 ENCOUNTER — Ambulatory Visit: Payer: Medicare Other | Attending: Audiologist | Admitting: Audiologist

## 2021-08-02 DIAGNOSIS — H903 Sensorineural hearing loss, bilateral: Secondary | ICD-10-CM | POA: Diagnosis present

## 2021-08-02 NOTE — Procedures (Signed)
?  [facility] ?[address] ?[place], [place]  [address] ?[phone] ? ?AUDIOLOGICAL  EVALUATION ? ?NAME: Taylor Berger     ?DOB:   Jun 27, 1945      ?MRN: [id number]                                                                                     ?DATE: 08/02/2021     ?REFERENT: Inda Coke, PA ?STATUS: Outpatient ?DIAGNOSIS: Sensorineural Hearing Loss Bilateral  ? ?History: ?Jahzir was seen for an audiological evaluation.  ?[name] is receiving a hearing evaluation due to concerns for difficulty hearing. [name] has difficulty hearing in background noise, crowds, and when people are at a distance. This difficulty began gradually. [name] knows he has hearing loss and has his hearing tested about two years ago in [place]. No pain or pressure reported in either ear. Tinnitus present in both ears on an off and is not bothersome. 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  ?Medical history negative for a health condition which is a risk factor for hearing loss. No other relevant case history reported.  ? ?Evaluation:  ?Otoscopy showed a clear view of the tympanic membranes, bilaterally ?Tympanometry results were consistent with normal middle ear function, bilaterally   ?Audiometric testing was completed using conventional audiometry with insert and supraural high frequency transducer. Speech Recognition Thresholds were 25dB in the right ear at 15dB in the left ear, consistent with pure tone averages. Word Recognition was excellent at 40dB SL level. Pure tone thresholds show normal sloping after 3kHz to severe sensorineural hearing loss in both ears at 6 and 8kHz only. Test results are consistent with high frequency hearing loss.  ? ?Results:  ?The test results were reviewed with [name]. He has mostly normal hearing. [name] has some loss in both ears at the very highest pitched tested, however there is little to know speech sounds that high and hearing  aids do not reach that pitch. He is not a hearing aid candidate at this time. To increase effective communication, talk face to talk within five feet. Do not start conversations from a distance or walk away from the person while speaking with them.  ? ?Recommendations: ?1.   Recommend annual audiologic monitoring.  ?  ?[name]  ?Audiologist, Au.D., CCC-A ?08/02/2021  11:51 AM ? ?Cc: Inda Coke, PA ? ?

## 2021-08-28 ENCOUNTER — Other Ambulatory Visit: Payer: Self-pay | Admitting: Physician Assistant

## 2021-08-31 ENCOUNTER — Encounter: Payer: Self-pay | Admitting: Physician Assistant

## 2021-08-31 ENCOUNTER — Ambulatory Visit (INDEPENDENT_AMBULATORY_CARE_PROVIDER_SITE_OTHER): Payer: Medicare Other | Admitting: Physician Assistant

## 2021-08-31 ENCOUNTER — Other Ambulatory Visit: Payer: Self-pay | Admitting: Physician Assistant

## 2021-08-31 VITALS — BP 130/80 | HR 94 | Temp 98.9°F | Ht 68.0 in | Wt 209.0 lb

## 2021-08-31 DIAGNOSIS — R051 Acute cough: Secondary | ICD-10-CM

## 2021-08-31 DIAGNOSIS — G5793 Unspecified mononeuropathy of bilateral lower limbs: Secondary | ICD-10-CM

## 2021-08-31 MED ORDER — HYDROCOD POLI-CHLORPHE POLI ER 10-8 MG/5ML PO SUER: 5.0000 mL | Freq: Two times a day (BID) | ORAL | 0 refills | Status: DC | PRN

## 2021-08-31 MED ORDER — DOXYCYCLINE HYCLATE 100 MG PO TABS: 100.0000 mg | ORAL_TABLET | Freq: Two times a day (BID) | ORAL | 0 refills | Status: DC

## 2021-08-31 NOTE — Progress Notes (Signed)
Taylor Berger is a 76 y.o. male here for a new problem.  History of Present Illness:   Chief Complaint  Patient presents with   Sinus Problem    PT c/o diarrhea off and on x 7 days, yesterday started with nasal congestion yellow/green discharge. Chills, but no temp. Also having cough and expectorating yellow/green. He is taking allergy med and imodium.    HPI  Cough Patient here with concern for sinus infection. He has been experiencing cough and nasal congestion. Symptoms started about 7 days ago. He has been coughing up some yellow/green sputum. Has had some diarrhea. He has tired taking Imodium, Nyquil and Dayquil with no relief. No other treatment tried. Some chills but denies fever or nausea. No vomiting. Denies shortness of breath.  Had diarrhea at the beginning of this illness, mostly resolved at this point.  Neuropathy  Patient complains of discoloration on bilateral feet that has been present for several weeks. States it is tender to touch. He notes this has been bothersome but denies any pain. He does have a hx of neuropathy of both feet. Feels like there is significant discoloration of increased brown spots on feet/legs.  Past Medical History:  Diagnosis Date   Allergy    Seasonal   Aortic aneurysm (HCC)    Depression    DVT (deep venous thrombosis) ([place]) 2020   GERD (gastroesophageal reflux disease)    Goiter    Hyperlipidemia    Hypertension    Lymphoma ([place]) 7858   Follicular, stomach   Neuropathy of both feet      Social History   Tobacco Use   Smoking status: Never   Smokeless tobacco: Never  Vaping Use   Vaping Use: Never used  Substance Use Topics   Alcohol use: Not Currently   Drug use: Never    Past Surgical History:  Procedure Laterality Date   CARPAL TUNNEL RELEASE Left    CERVICAL DISCECTOMY  1995   C-7   CERVICAL FUSION  01/1989   C-3 through C-6 / no metal   COLONOSCOPY     ELBOW SURGERY Left    FOOT SURGERY Left 1999   Plantar fascitis    low back fusion  2013   Lumbar & Low Thoracic with metal   SHOULDER ARTHROSCOPY WITH ROTATOR CUFF REPAIR Right 2014   UPPER GASTROINTESTINAL ENDOSCOPY      Family History  Problem Relation Age of Onset   Diabetes Mother    Hypertension Mother    Hyperlipidemia Mother    Heart failure Mother    Leukemia Father    Stroke Father    Esophageal cancer Neg Hx    Colon polyps Neg Hx    Pancreatic cancer Neg Hx    Stomach cancer Neg Hx    Rectal cancer Neg Hx     No Known Allergies  Current Medications:   Current Outpatient Medications:    amLODipine (NORVASC) 10 MG tablet, Take 1 tablet by mouth once daily, Disp: 90 tablet, Rfl: 0   atorvastatin (LIPITOR) 40 MG tablet, Take 1 tablet by mouth once daily, Disp: 90 tablet, Rfl: 0   cetirizine (ZYRTEC) 10 MG tablet, Take 10 mg by mouth daily., Disp: , Rfl:    clindamycin (CLEOCIN-T) 1 % external solution, Apply topically 2 (two) times daily. (Patient taking differently: Apply topically as needed.), Disp: 30 mL, Rfl: 0   esomeprazole (NEXIUM) 20 MG capsule, Take 20 mg by mouth daily at 12 noon., Disp: , Rfl:  fluticasone (FLONASE) 50 MCG/ACT nasal spray, Place into both nostrils as needed for allergies or rhinitis., Disp: , Rfl:    lisinopril (ZESTRIL) 5 MG tablet, Take 1 tablet by mouth once daily, Disp: 90 tablet, Rfl: 0   rivaroxaban (XARELTO) 10 MG TABS tablet, Take 1 tablet (10 mg total) by mouth daily. (Patient taking differently: Take 20 mg by mouth daily.), Disp: 90 tablet, Rfl: 2   valACYclovir (VALTREX) 500 MG tablet, Take 1 tablet (500 mg total) by mouth daily., Disp: 90 tablet, Rfl: 3   Review of Systems:   ROS Negative unless otherwise specified per HPI.   Vitals:   Vitals:   08/31/21 1029  BP: 130/80  Pulse: 94  Temp: 98.9 F (37.2 C)  TempSrc: Temporal  SpO2: 93%  Weight: 209 lb (94.8 kg)  Height: '5\' 8"'$  (1.727 m)     Body mass index is 31.78 kg/m.  Physical Exam:   Physical Exam Vitals and nursing  note reviewed.  Constitutional:      General: He is not in acute distress.    Appearance: He is well-developed. He is not ill-appearing or toxic-appearing.  Cardiovascular:     Rate and Rhythm: Normal rate and regular rhythm.     Pulses: Normal pulses.          Dorsalis pedis pulses are 2+ on the right side and 2+ on the left side.       Posterior tibial pulses are 2+ on the right side and 2+ on the left side.     Heart sounds: Normal heart sounds, S1 normal and S2 normal.     Comments: Normal cap refills to toes b/l Pulmonary:     Effort: Pulmonary effort is normal.     Breath sounds: Normal breath sounds.  Musculoskeletal:     Comments: No TTP of calf, no erythema/swelling  Feet:     Comments: Evidence of hyperpigmented macules to dorsal aspect of foot; pallor to b/l great toes    Skin:    General: Skin is warm and dry.  Neurological:     Mental Status: He is alert.     GCS: GCS eye subscore is 4. GCS verbal subscore is 5. GCS motor subscore is 6.  Psychiatric:        Speech: Speech normal.        Behavior: Behavior normal. Behavior is cooperative.    Assessment and Plan:   Acute cough No red flags on exam.  Will initiate doxycycline for presumed sinusitis per orders. Tussionex also prescribed, sleepy precautions advised. Discussed taking medications as prescribed. Reviewed return precautions including worsening fever, SOB, worsening cough or other concerns. Push fluids and rest. I recommend that patient follow-up if symptoms worsen or persist despite treatment x 7-10 days, sooner if needed.  Neuropathy of both feet No red flags Pulses intact and cap refill WNL Will assess ABI's and provide further recommendations at that time  I,Savera Zaman,acting as a scribe for Inda Coke, PA.,have documented all relevant documentation on the behalf of Inda Coke, PA,as directed by  Inda Coke, PA while in the presence of Inda Coke, [place].   I, Inda Coke, [place],  have reviewed all documentation for this visit. The documentation on 08/31/21 for the exam, diagnosis, procedures, and orders are all accurate and complete.   Inda Coke, PA-C

## 2021-08-31 NOTE — Telephone Encounter (Signed)
Dr. Jerline Pain can resend Rx to [facility].  [facility] did not have it in stock. Thanks

## 2021-08-31 NOTE — Patient Instructions (Signed)
It was great to see you!  Start the doxycycline for your cough and sinus infection. Push fluids and get plenty of rest. Please return if you are not improving as expected, or if you have high fevers (>101.5) or difficulty swallowing or worsening productive cough.  Cough syrup sent in -- please use sparingly and do not drive while taking this medication.  We will get ultrasounds to assess the blood flow in your legs.  Call clinic with questions.  I hope you start feeling better soon!

## 2021-09-01 ENCOUNTER — Other Ambulatory Visit: Payer: Self-pay | Admitting: Physician Assistant

## 2021-09-01 MED ORDER — HYDROCOD POLI-CHLORPHE POLI ER 10-8 MG/5ML PO SUER: 5.0000 mL | Freq: Two times a day (BID) | ORAL | 0 refills | Status: DC | PRN

## 2021-09-01 NOTE — Telephone Encounter (Signed)
Forwarding to PCP.

## 2021-09-01 NOTE — Telephone Encounter (Signed)
Pt called to check on rx and to find out when it would be filled. I advise pt it was forwarded to [facility] and to check with his pharmacy later this afternoon.

## 2021-09-07 ENCOUNTER — Ambulatory Visit (INDEPENDENT_AMBULATORY_CARE_PROVIDER_SITE_OTHER): Payer: Medicare Other | Admitting: Physician Assistant

## 2021-09-07 ENCOUNTER — Encounter: Payer: Self-pay | Admitting: Physician Assistant

## 2021-09-07 VITALS — BP 106/70 | HR 69 | Temp 98.3°F | Ht 68.0 in | Wt 201.0 lb

## 2021-09-07 DIAGNOSIS — R051 Acute cough: Secondary | ICD-10-CM | POA: Diagnosis not present

## 2021-09-07 DIAGNOSIS — B009 Herpesviral infection, unspecified: Secondary | ICD-10-CM

## 2021-09-07 DIAGNOSIS — R197 Diarrhea, unspecified: Secondary | ICD-10-CM | POA: Diagnosis not present

## 2021-09-07 LAB — COMPREHENSIVE METABOLIC PANEL
ALT: 37 U/L (ref 0–53)
AST: 31 U/L (ref 0–37)
Albumin: 4.2 g/dL (ref 3.5–5.2)
Alkaline Phosphatase: 72 U/L (ref 39–117)
BUN: 24 mg/dL — ABNORMAL HIGH (ref 6–23)
CO2: 20 mEq/L (ref 19–32)
Calcium: 10.3 mg/dL (ref 8.4–10.5)
Chloride: 109 mEq/L (ref 96–112)
Creatinine, Ser: 1.07 mg/dL (ref 0.40–1.50)
GFR: 66.95 mL/min (ref >60.00)
Glucose, Bld: 113 mg/dL — ABNORMAL HIGH (ref 70–99)
Potassium: 4 mEq/L (ref 3.5–5.1)
Sodium: 137 mEq/L (ref 135–145)
Total Bilirubin: 0.8 mg/dL (ref 0.2–1.2)
Total Protein: 6.9 g/dL (ref 6.0–8.3)

## 2021-09-07 LAB — CBC WITH DIFFERENTIAL/PLATELET
Basophils Absolute: 0 10*3/uL (ref 0.0–0.1)
Basophils Relative: 0.5 % (ref 0.0–3.0)
Eosinophils Absolute: 0.2 10*3/uL (ref 0.0–0.7)
Eosinophils Relative: 2.9 % (ref 0.0–5.0)
HCT: 42.9 % (ref 39.0–52.0)
Hemoglobin: 14.3 g/dL (ref 13.0–17.0)
Lymphocytes Relative: 20.1 % (ref 12.0–46.0)
Lymphs Abs: 1.4 10*3/uL (ref 0.7–4.0)
MCHC: 33.3 g/dL (ref 30.0–36.0)
MCV: 96.6 fl (ref 78.0–100.0)
Monocytes Absolute: 0.9 10*3/uL (ref 0.1–1.0)
Monocytes Relative: 13.1 % — ABNORMAL HIGH (ref 3.0–12.0)
Neutro Abs: 4.5 10*3/uL (ref 1.4–7.7)
Neutrophils Relative %: 63.4 % (ref 43.0–77.0)
Platelets: 227 10*3/uL (ref 150.0–400.0)
RBC: 4.44 Mil/uL (ref 4.22–5.81)
RDW: 13.5 % (ref 11.5–15.5)
WBC: 7.1 10*3/uL (ref 4.0–10.5)

## 2021-09-07 LAB — LIPASE: Lipase: 44 U/L (ref 11.0–59.0)

## 2021-09-07 MED ORDER — VALACYCLOVIR HCL 500 MG PO TABS: 500.0000 mg | ORAL_TABLET | Freq: Every day | ORAL | 3 refills | Status: DC

## 2021-09-07 NOTE — Progress Notes (Signed)
Taylor Berger is a 76 y.o. male here for a follow up of a pre-existing problem.  History of Present Illness:   Chief Complaint  Patient presents with   Cough    Pt still c/o cough, chest congestion, coughing and expectorating yellow/clear sputum. Chills off and on.   Diarrhea    Pt c/ o diarrhea x 2 weeks, going 4-5 times a day. Has not tried anything. He is also c/o indigestion when he eats solid food. He is taking Nexium and additional antacid    HPI  Cough Patient is still experiencing cough that has been onset for the past 2 weeks. We saw him for this issue on 09/01/2021. He has been been coughing up yellow/clear sputum. He was started on doxycycline and Tussionex at that time. Today, he reports his cough has not improved. He has finished Doxycycline and thinks this helped. He has been feeling better since finishing the antibiotics. He has taken Dayquil and Pepto bismol with no relief. No fever, no leg swelling, no reported shortness of breath  Diarrhea  Patient complain of diarrhea that has been going on for the past 2 weeks. Usually have 4-5 times a day. He has been having watery diarrhea and some nausea. Has not tried any treatment for this issue. States he has has diarrhea twice this morning. He has eaten some solid food last night and has not been feeling well. Denies lower abdominal pain or hematochezia. No urinary symptoms.   HSV He needs refill on his valtrex. Tolerating well, no concerns.  Past Medical History:  Diagnosis Date   Allergy    Seasonal   Aortic aneurysm (HCC)    Depression    DVT (deep venous thrombosis) ([place]) 2020   GERD (gastroesophageal reflux disease)    Goiter    Hyperlipidemia    Hypertension    Lymphoma ([place]) 3762   Follicular, stomach   Neuropathy of both feet      Social History   Tobacco Use   Smoking status: Never   Smokeless tobacco: Never  Vaping Use   Vaping Use: Never used  Substance Use Topics   Alcohol use: Not Currently   Drug  use: Never    Past Surgical History:  Procedure Laterality Date   CARPAL TUNNEL RELEASE Left    CERVICAL DISCECTOMY  1995   C-7   CERVICAL FUSION  01/1989   C-3 through C-6 / no metal   COLONOSCOPY     ELBOW SURGERY Left    FOOT SURGERY Left 1999   Plantar fascitis   low back fusion  2013   Lumbar & Low Thoracic with metal   SHOULDER ARTHROSCOPY WITH ROTATOR CUFF REPAIR Right 2014   UPPER GASTROINTESTINAL ENDOSCOPY      Family History  Problem Relation Age of Onset   Diabetes Mother    Hypertension Mother    Hyperlipidemia Mother    Heart failure Mother    Leukemia Father    Stroke Father    Esophageal cancer Neg Hx    Colon polyps Neg Hx    Pancreatic cancer Neg Hx    Stomach cancer Neg Hx    Rectal cancer Neg Hx     No Known Allergies  Current Medications:   Current Outpatient Medications:    amLODipine (NORVASC) 10 MG tablet, Take 1 tablet by mouth once daily, Disp: 90 tablet, Rfl: 0   atorvastatin (LIPITOR) 40 MG tablet, Take 1 tablet by mouth once daily, Disp: 90 tablet, Rfl: 0  cetirizine (ZYRTEC) 10 MG tablet, Take 10 mg by mouth daily., Disp: , Rfl:    chlorpheniramine-HYDROcodone (TUSSIONEX PENNKINETIC ER) 10-8 MG/5ML, Take 5 mLs by mouth every 12 (twelve) hours as needed for cough., Disp: 70 mL, Rfl: 0   clindamycin (CLEOCIN-T) 1 % external solution, Apply topically 2 (two) times daily. (Patient taking differently: Apply topically as needed.), Disp: 30 mL, Rfl: 0   esomeprazole (NEXIUM) 20 MG capsule, Take 20 mg by mouth daily at 12 noon., Disp: , Rfl:    fluticasone (FLONASE) 50 MCG/ACT nasal spray, Place into both nostrils as needed for allergies or rhinitis., Disp: , Rfl:    lisinopril (ZESTRIL) 5 MG tablet, Take 1 tablet by mouth once daily, Disp: 90 tablet, Rfl: 0   rivaroxaban (XARELTO) 10 MG TABS tablet, Take 1 tablet (10 mg total) by mouth daily. (Patient taking differently: Take 20 mg by mouth daily.), Disp: 90 tablet, Rfl: 2   valACYclovir  (VALTREX) 500 MG tablet, Take 1 tablet (500 mg total) by mouth daily., Disp: 90 tablet, Rfl: 3   Review of Systems:   ROS Negative unless otherwise specified per HPI.   Vitals:   Vitals:   09/07/21 1008  BP: 106/70  Pulse: 69  Temp: 98.3 F (36.8 C)  TempSrc: Temporal  SpO2: 93%  Weight: 201 lb (91.2 kg)  Height: '5\' 8"'$  (1.727 m)     Body mass index is 30.56 kg/m.  Physical Exam:   Physical Exam Vitals and nursing note reviewed.  Constitutional:      General: He is not in acute distress.    Appearance: He is well-developed. He is not ill-appearing or toxic-appearing.  Cardiovascular:     Rate and Rhythm: Normal rate and regular rhythm.     Pulses: Normal pulses.     Heart sounds: Normal heart sounds, S1 normal and S2 normal.  Pulmonary:     Effort: Pulmonary effort is normal.     Breath sounds: Normal breath sounds.  Skin:    General: Skin is warm and dry.  Neurological:     Mental Status: He is alert.     GCS: GCS eye subscore is 4. GCS verbal subscore is 5. GCS motor subscore is 6.  Psychiatric:        Speech: Speech normal.        Behavior: Behavior normal. Behavior is cooperative.     Assessment and Plan:   Diarrhea, unspecified type Ongoing No red flags -- vitals stable and no abdominal tenderness Recommend pushing fluids - 64 oz water and work on adding in [employer] Imodium discussed and instructed on AVS Blood work today Instructed to keep a close eye on blood pressure and hold lisinopril if BP is consistently < 100/60 or if dizzy/lightheaded If new/worsening symptoms, needs ER evaluation  Acute cough No red flags Suspect ongoing post-infectious cough; gradually improving Continue supportive care and tussionex at pm If any worsening, will obtain imaging  HSV Well controlled Refill valtrex 500 mg daily Follow-up as needed  I,Savera Zaman,acting as a scribe for [employer], PA.,have documented all relevant documentation on the  behalf of Inda Coke, PA,as directed by  Inda Coke, PA while in the presence of Inda Coke, [place].   I, Inda Coke, [place], have reviewed all documentation for this visit. The documentation on 09/07/21 for the exam, diagnosis, procedures, and orders are all accurate and complete.   Inda Coke, PA-C

## 2021-09-07 NOTE — Patient Instructions (Addendum)
It was great to see you,  Keep an eye on your blood pressure -- if its consistently <100/60, I need you to hold your lisinopril 5 mg. Call me with questions about this.  We are getting blood work today. Bring back the stool studies.  For your symptoms you may take Imodium 2 mg tablets that are over the counter at your local pharmacy. Take two tablet now and then one after each loose stool up to 6 a day.   If any worsening, please go to the ER.  HOME CARE We recommend changing your diet to help with your symptoms for the next few days. Drink plenty of fluids that contain water salt and sugar. Sports drinks such as Gatorade may help.  You may try broths, soups, bananas, applesauce, soft breads, mashed potatoes or crackers.  You are considered infectious for as long as the diarrhea continues. Hand washing or use of alcohol based hand sanitizers is recommend. It is best to stay out of work or school until your symptoms stop.   GET HELP RIGHT AWAY If you have dark yellow colored urine or do not pass urine frequently you should drink more fluids.   If your symptoms worsen  If you feel like you are going to pass out (faint) You have a new problem  MAKE SURE YOU  Understand these instructions. Will watch your condition. Will get help right away if you are not doing well or get worse.

## 2021-09-14 ENCOUNTER — Telehealth: Payer: Self-pay | Admitting: Physician Assistant

## 2021-09-14 NOTE — Telephone Encounter (Signed)
Pt states stool sample bottle was dropped into the toilet.   Please call back with instructions about how to get a new container.

## 2021-09-15 NOTE — Telephone Encounter (Signed)
Spoke to pt's wife told her pt will need to stop by and pick up new bottle for stool specimen from the lab. Mrs. See verbalized understanding and will let pt know.

## 2021-09-24 LAB — GASTROINTESTINAL PATHOGEN PNL
CampyloBacter Group: NOT DETECTED
Norovirus GI/GII: NOT DETECTED
Rotavirus A: NOT DETECTED
Salmonella species: NOT DETECTED
Shiga Toxin 1: NOT DETECTED
Shiga Toxin 2: NOT DETECTED
Shigella Species: NOT DETECTED
Vibrio Group: NOT DETECTED
Yersinia enterocolitica: NOT DETECTED

## 2021-09-26 ENCOUNTER — Ambulatory Visit (HOSPITAL_COMMUNITY)
Admission: RE | Admit: 2021-09-26 | Discharge: 2021-09-26 | Disposition: A | Discharge status: Home / Self Care | Payer: Medicare Other | Source: Ambulatory Visit | Attending: Physician Assistant | Admitting: Physician Assistant

## 2021-09-26 DIAGNOSIS — G5793 Unspecified mononeuropathy of bilateral lower limbs: Secondary | ICD-10-CM | POA: Diagnosis present

## 2021-09-28 ENCOUNTER — Ambulatory Visit (INDEPENDENT_AMBULATORY_CARE_PROVIDER_SITE_OTHER): Payer: Medicare Other | Admitting: Family

## 2021-09-28 ENCOUNTER — Encounter: Payer: Self-pay | Admitting: Family

## 2021-09-28 VITALS — BP 115/63 | HR 62 | Temp 98.1°F | Ht 68.0 in | Wt 205.4 lb

## 2021-09-28 DIAGNOSIS — L0231 Cutaneous abscess of buttock: Secondary | ICD-10-CM

## 2021-09-28 MED ORDER — DOXYCYCLINE HYCLATE 100 MG PO TABS: 100.0000 mg | ORAL_TABLET | Freq: Two times a day (BID) | ORAL | 0 refills | Status: DC

## 2021-09-28 NOTE — Patient Instructions (Addendum)
It was very nice to see you today!   I have sent over Doxycycline antibiotic to your pharmacy. Be careful to cover up if outside in the sun while taking this medication. Continue to clean your bottom with soap and water daily, ok to continue to apply just a thin layer of antibiotic cream to the boil and keep covered as long as it is still draining. I have sent over a general surgery referral to discuss getting the boil "capsule" removed to prevent future episodes.   PLEASE NOTE:  If you had any lab tests please let us know if you have not heard back within a few days. You may see your results on MyChart before we have a chance to review them but we will give you a call once they are reviewed by [place]. If we ordered any referrals today, please let us know if you have not heard from their office within the next week.

## 2021-09-28 NOTE — Progress Notes (Signed)
Subjective:     Patient ID: Taylor Berger, male    DOB: 09/17/1945, 76 y.o.   MRN: [id number]  Chief Complaint  Patient presents with   Cyst    Pt c/o a painful cyst on his right bottom that keeps coming back. Pt states he squeezed it and pus came out. Uses  clindamycin which helps it but he states he has et it get to bad.    HPI: Abscess: Patient presents for evaluation of a cutaneous abscess. Lesion is located in the right buttock. Onset was 1 week ago. Symptoms have gradually worsened. Abscess has associated symptoms of spontaneous drainage, pain. Patient does have previous history of cutaneous abscesses. Patient does not have diabetes. Pt reports hx of recurring abscess in same area over last few years.   Assessment & Plan:   Problem List Items Addressed This Visit   None Visit Diagnoses     Cutaneous abscess of buttock    -  Primary right side, approx 2cm induration, pt had tried to drain at home, able to express a small amount of purulent drainage, has done in past and the boil heals, but this time it is taking longer. Advised on getting it surgically drained & capsule removed, referring to surgery, may be healed but next time he can have them assess, has had recurring episodes about 2 times/year for last couple of years. Sending DOXY, advised on use & SE, advised on cleaning daily with soap & water, apply thin layer of OTC abt cream to boil and cover until no longer draining.   Relevant Medications   doxycycline (VIBRA-TABS) 100 MG tablet   Other Relevant Orders   Ambulatory referral to General Surgery        Outpatient Medications Prior to Visit  Medication Sig Dispense Refill   amLODipine (NORVASC) 10 MG tablet Take 1 tablet by mouth once daily 90 tablet 0   atorvastatin (LIPITOR) 40 MG tablet Take 1 tablet by mouth once daily 90 tablet 0   cetirizine (ZYRTEC) 10 MG tablet Take 10 mg by mouth daily.     clindamycin (CLEOCIN-T) 1 % external solution Apply topically 2 (two)  times daily. (Patient taking differently: Apply topically as needed.) 30 mL 0   esomeprazole (NEXIUM) 20 MG capsule Take 20 mg by mouth daily at 12 noon.     fluticasone (FLONASE) 50 MCG/ACT nasal spray Place into both nostrils as needed for allergies or rhinitis.     lisinopril (ZESTRIL) 5 MG tablet Take 1 tablet by mouth once daily 90 tablet 0   rivaroxaban (XARELTO) 10 MG TABS tablet Take 1 tablet (10 mg total) by mouth daily. (Patient taking differently: Take 20 mg by mouth daily.) 90 tablet 2   valACYclovir (VALTREX) 500 MG tablet Take 1 tablet (500 mg total) by mouth daily. 90 tablet 3   chlorpheniramine-HYDROcodone (TUSSIONEX PENNKINETIC ER) 10-8 MG/5ML Take 5 mLs by mouth every 12 (twelve) hours as needed for cough. 70 mL 0   No facility-administered medications prior to visit.    Past Medical History:  Diagnosis Date   Allergy    Seasonal   Aortic aneurysm (HCC)    Depression    DVT (deep venous thrombosis) ([place]) 2020   GERD (gastroesophageal reflux disease)    Goiter    Hyperlipidemia    Hypertension    Lymphoma ([place]) 6761   Follicular, stomach   Neuropathy of both feet     Past Surgical History:  Procedure Laterality Date   CARPAL  TUNNEL RELEASE Left    CERVICAL DISCECTOMY  1995   C-7   CERVICAL FUSION  01/1989   C-3 through C-6 / no metal   COLONOSCOPY     ELBOW SURGERY Left    FOOT SURGERY Left 1999   Plantar fascitis   low back fusion  2013   Lumbar & Low Thoracic with metal   SHOULDER ARTHROSCOPY WITH ROTATOR CUFF REPAIR Right 2014   UPPER GASTROINTESTINAL ENDOSCOPY      No Known Allergies     Objective:    Physical Exam Vitals and nursing note reviewed.  Constitutional:      General: He is not in acute distress.    Appearance: Normal appearance.  HENT:     Head: Normocephalic.  Cardiovascular:     Rate and Rhythm: Normal rate and regular rhythm.  Pulmonary:     Effort: Pulmonary effort is normal.     Breath sounds: Normal breath sounds.   Musculoskeletal:        General: Normal range of motion.     Cervical back: Normal range of motion.  Skin:    General: Skin is warm and dry.     Findings: Abscess (approx 2cm diameter w/erythema & induration, with small area in center draining purulent drainage) present.       Neurological:     Mental Status: He is alert and oriented to person, place, and time.  Psychiatric:        Mood and Affect: Mood normal.     BP 115/63 (BP Location: Left Arm, Patient Position: Sitting, Cuff Size: Large)   Pulse 62   Temp 98.1 F (36.7 C) (Temporal)   Ht '5\' 8"'$  (1.727 m)   Wt 205 lb 6 oz (93.2 kg)   SpO2 94%   BMI 31.23 kg/m  Wt Readings from Last 3 Encounters:  09/28/21 205 lb 6 oz (93.2 kg)  09/07/21 201 lb (91.2 kg)  08/31/21 209 lb (94.8 kg)        Meds ordered this encounter  Medications   doxycycline (VIBRA-TABS) 100 MG tablet    Sig: Take 1 tablet (100 mg total) by mouth 2 (two) times daily.    Dispense:  14 tablet    Refill:  0    Order Specific Question:   Supervising Provider    Answer:   ANDY, CAMILLE L [[id number]]    Jeanie Sewer, NP

## 2021-11-16 ENCOUNTER — Telehealth: Payer: Self-pay | Admitting: Physician Assistant

## 2021-11-16 MED ORDER — ATORVASTATIN CALCIUM 80 MG PO TABS: 80.0000 mg | ORAL_TABLET | Freq: Every day | ORAL | 3 refills | Status: DC

## 2021-11-16 NOTE — Telephone Encounter (Signed)
This was recommended due to the results of his leg ultrasound:  "Your imaging doesn't show any significant issues with blood flow, however, it does show plaque in your leg arteries.   We can intensify your statin by increasing your lipitor to 80 mg daily to help with this.   We can get another ultrasound to further evaluate blood flow if you are still having symptoms."  Please sent in lipitor 80 mg for patient.  Taylor Berger

## 2021-11-16 NOTE — Telephone Encounter (Signed)
Samantha, please advise what dosage of Atorvastatin pt should be taking? I do not see where is was increased. Please advise.

## 2021-11-16 NOTE — Addendum Note (Signed)
Addended by: Marian Sorrow on: 11/16/2021 02:15 PM   Modules accepted: Orders

## 2021-11-16 NOTE — Telephone Encounter (Signed)
   Patient walked in stating he was asked to double atorvastatin from 40 mg to 80 mg - he never received a call from pharmacy so he doubled it himself by taking 2 x 40 mgs -  Patient no longer has atorvastatin and needs him 80 MG to be called in -    [place] confirmed

## 2021-11-16 NOTE — Telephone Encounter (Signed)
Pt notified Rx for Atorvastatin 80 mg sent to pharmacy.

## 2021-12-06 ENCOUNTER — Other Ambulatory Visit: Payer: Self-pay

## 2021-12-06 DIAGNOSIS — C8203 Follicular lymphoma grade I, intra-abdominal lymph nodes: Secondary | ICD-10-CM

## 2021-12-07 ENCOUNTER — Inpatient Hospital Stay (HOSPITAL_BASED_OUTPATIENT_CLINIC_OR_DEPARTMENT_OTHER): Payer: Medicare Other | Admitting: Hematology and Oncology

## 2021-12-07 ENCOUNTER — Other Ambulatory Visit: Payer: Self-pay

## 2021-12-07 ENCOUNTER — Inpatient Hospital Stay: Payer: Medicare Other | Attending: Hematology and Oncology

## 2021-12-07 ENCOUNTER — Encounter: Payer: Self-pay | Admitting: Physician Assistant

## 2021-12-07 VITALS — BP 126/77 | HR 46 | Temp 97.2°F | Resp 15 | Wt 208.1 lb

## 2021-12-07 DIAGNOSIS — C8203 Follicular lymphoma grade I, intra-abdominal lymph nodes: Secondary | ICD-10-CM

## 2021-12-07 DIAGNOSIS — Z7901 Long term (current) use of anticoagulants: Secondary | ICD-10-CM | POA: Insufficient documentation

## 2021-12-07 DIAGNOSIS — I82411 Acute embolism and thrombosis of right femoral vein: Secondary | ICD-10-CM | POA: Diagnosis not present

## 2021-12-07 LAB — CBC WITH DIFFERENTIAL (CANCER CENTER ONLY)
Abs Immature Granulocytes: 0.04 10*3/uL (ref 0.00–0.07)
Basophils Absolute: 0.1 10*3/uL (ref 0.0–0.1)
Basophils Relative: 1 %
Eosinophils Absolute: 0.3 10*3/uL (ref 0.0–0.5)
Eosinophils Relative: 4 %
HCT: 42.9 % (ref 39.0–52.0)
Hemoglobin: 14.9 g/dL (ref 13.0–17.0)
Immature Granulocytes: 1 %
Lymphocytes Relative: 22 %
Lymphs Abs: 1.5 10*3/uL (ref 0.7–4.0)
MCH: 32.8 pg (ref 26.0–34.0)
MCHC: 34.7 g/dL (ref 30.0–36.0)
MCV: 94.5 fL (ref 80.0–100.0)
Monocytes Absolute: 0.8 10*3/uL (ref 0.1–1.0)
Monocytes Relative: 11 %
Neutro Abs: 4.3 10*3/uL (ref 1.7–7.7)
Neutrophils Relative %: 61 %
Platelet Count: 222 10*3/uL (ref 150–400)
RBC: 4.54 MIL/uL (ref 4.22–5.81)
RDW: 12.9 % (ref 11.5–15.5)
WBC Count: 7 10*3/uL (ref 4.0–10.5)
nRBC: 0 % (ref 0.0–0.2)

## 2021-12-07 LAB — CMP (CANCER CENTER ONLY)
ALT: 29 U/L (ref 0–44)
AST: 21 U/L (ref 15–41)
Albumin: 4.5 g/dL (ref 3.5–5.0)
Alkaline Phosphatase: 78 U/L (ref 38–126)
Anion gap: 4 — ABNORMAL LOW (ref 5–15)
BUN: 19 mg/dL (ref 8–23)
CO2: 28 mmol/L (ref 22–32)
Calcium: 10.6 mg/dL — ABNORMAL HIGH (ref 8.9–10.3)
Chloride: 107 mmol/L (ref 98–111)
Creatinine: 1.04 mg/dL (ref 0.61–1.24)
GFR, Estimated: >60 mL/min (ref >60)
Glucose, Bld: 116 mg/dL — ABNORMAL HIGH (ref 70–99)
Potassium: 3.8 mmol/L (ref 3.5–5.1)
Sodium: 139 mmol/L (ref 135–145)
Total Bilirubin: 0.9 mg/dL (ref 0.3–1.2)
Total Protein: 7.5 g/dL (ref 6.5–8.1)

## 2021-12-07 LAB — LACTATE DEHYDROGENASE: LDH: 165 U/L (ref 98–192)

## 2021-12-07 NOTE — Addendum Note (Signed)
Addended by: Aura Fey A on: 12/07/2021 11:31 AM   Modules accepted: Orders

## 2021-12-07 NOTE — Progress Notes (Signed)
[place] [phone]   [phone]  PROGRESS NOTE  Patient Care Team: Taylor Berger, [place] as PCP - General (Physician Assistant) Taylor Munroe, MD as PCP - Cardiology (Cardiology)  Hematological/Oncological History # Follicular Lymphoma Stage I Grade IA 1) 05/20/2019: CT scan showed stable mesenteric nodule, largest 4.2 cm and mild periaortic lymphadenopathy.  2) 06/04/2019: biopsy of abdominal lymph node showed grade 1 follicular lymphoma. Observation recommended by his oncologist at [facility] in [place], [place].  3) 03/14/2020: establish care with Taylor Berger  4) 04/13/2020: CT scan showed stable mesenteric lymphadenopathy, measuring 3.9 x 1.9 cm. 5) 11/17/2020: presented to ED for abdominal discomfort. Underwent CT A/P and found to have relatively stable lymph node measuring 4.3 x 2.3 cm.  6) 08/01/2021: CT Ab/Pelvis ordered for LLQ pain. Fidings show mesenteric lymph node conglomerate, largest component measuring up to 4.3 x 2.2 cm.  HISTORY OF PRESENTING ILLNESS:  Taylor Berger 76 y.o. male with medical history significant for HTN, HLD, DVT ,GERD, and follicular lymphoma (diagnosed 06/2019 and under observation) presents for a follow up visit for follicular lymphoma and recurrent RLE DVT.   On exam today Taylor Berger notes he has been well overall interim since her last visit.  He is continue to take Xarelto 20 mg p.o. daily as he has a quite large supply of these.  He notes that he does have a 10 mg supply which we will convert over to when he runs out of the 20s.  He reports he is not having any difficulty with this medication.  He tends to get it from a supplier in [place] for $90 for 63-monthsupply.  He notes he has some discoloration on the top of his foot for which he is going to be seeing a podiatrist shortly.  He notes he is not having any swelling, leg pain, chest pain, shortness of breath.  He did have a bout of left lower  quadrant pain in May 2023 which was thought to be due to constipation.  With his bowels begin moving the pain resolved.  He notes he has not had any signs or symptoms concerning for progressive lymphoma.  He has gained 7 pounds in the interim since her last visit, though he notes he would like to lose about 25 pounds once he finishes working on his house.  He denies any lymphadenopathy weight loss or changes in appetite.  He denies any bleeding, bruising, or dark stools.  He denies any fevers, chills, sweats, nausea vomiting or diarrhea.  Otherwise a full point ROS is listed below.   MEDICAL HISTORY:  Past Medical History:  Diagnosis Date   Allergy    Seasonal   Aortic aneurysm (HCC)    Depression    DVT [place]deep [place] thrombosis) [place]   GERD (gastroesophageal reflux disease)    Goiter    Hyperlipidemia    Hypertension    Lymphoma [place] [address]  Follicular, stomach   Neuropathy of both feet     SURGICAL HISTORY: Past Surgical History:  Procedure Laterality Date   CARPAL TUNNEL RELEASE Left    CERVICAL DISCECTOMY  1995   C-7   CERVICAL FUSION  01/1989   C-3 through C-6 / no metal   COLONOSCOPY     ELBOW SURGERY Left    FOOT SURGERY Left 1999   Plantar fascitis   low back fusion  2013   Lumbar & Low Thoracic with metal   SHOULDER ARTHROSCOPY WITH ROTATOR  CUFF REPAIR Right 2014   UPPER GASTROINTESTINAL ENDOSCOPY      SOCIAL HISTORY: Social History   Socioeconomic History   Marital status: Married    Spouse name: Not on file   Number of children: Not on file   Years of education: Not on file   Highest education level: Not on file  Occupational History   Occupation: retired   Tobacco Use   Smoking status: Never   Smokeless tobacco: Never  Vaping Use   Vaping Use: Never used  Substance and Sexual Activity   Alcohol use: Not Currently   Drug use: Never   Sexual activity: Not Currently  Other Topics Concern   Not on file  Social History Narrative   ** Merged  History Encounter **       Moved from [facility] to be closer to son Married   Social Determinants of Health   Financial Resource Strain: Low Risk  (05/19/2021)   Overall Financial Resource Strain (CARDIA)    Difficulty of Paying Living Expenses: Not hard at all  Food Insecurity: No Food Insecurity (05/19/2021)   Hunger Vital Sign    Worried About Running Out of Food in the Last Year: Never true    [place] in the Last Year: Never true  Transportation Needs: No Transportation Needs (05/19/2021)   PRAPARE - Hydrologist (Medical): No    Lack of Transportation (Non-Medical): No  Physical Activity: Inactive (05/19/2021)   Exercise Vital Sign    Days of Exercise per Week: 0 days    Minutes of Exercise per Session: 0 min  Stress: No Stress Concern Present (05/19/2021)   [place]    Feeling of Stress : Not at all  Social Connections: Moderately Isolated (05/19/2021)   Social Connection and Isolation Panel [NHANES]    Frequency of Communication with Friends and Family: Once a week    Frequency of Social Gatherings with Friends and Family: Twice a week    Attends Religious Services: Never    Marine scientist or Organizations: No    Attends Archivist Meetings: Never    Marital Status: Married  Human resources officer Violence: Not At Risk (05/19/2021)   Humiliation, Afraid, Rape, and Kick questionnaire    Fear of Current or Ex-Partner: No    Emotionally Abused: No    Physically Abused: No    Sexually Abused: No    FAMILY HISTORY: Family History  Problem Relation Age of Onset   Diabetes Mother    Hypertension Mother    Hyperlipidemia Mother    Heart failure Mother    Leukemia Father    Stroke Father    Esophageal cancer Neg Hx    Colon polyps Neg Hx    Pancreatic cancer Neg Hx    Stomach cancer Neg Hx    Rectal cancer Neg Hx     ALLERGIES:  has No Known  Allergies.  MEDICATIONS:  Current Outpatient Medications  Medication Sig Dispense Refill   amLODipine (NORVASC) 10 MG tablet Take 1 tablet by mouth once daily 90 tablet 0   atorvastatin (LIPITOR) 80 MG tablet Take 1 tablet (80 mg total) by mouth daily. 90 tablet 3   cetirizine (ZYRTEC) 10 MG tablet Take 10 mg by mouth daily.     clindamycin (CLEOCIN-T) 1 % external solution Apply topically 2 (two) times daily. (Patient taking differently: Apply topically as needed.) 30 mL 0  doxycycline (VIBRA-TABS) 100 MG tablet Take 1 tablet (100 mg total) by mouth 2 (two) times daily. 14 tablet 0   esomeprazole (NEXIUM) 20 MG capsule Take 20 mg by mouth daily at 12 noon.     fluticasone (FLONASE) 50 MCG/ACT nasal spray Place into both nostrils as needed for allergies or rhinitis.     lisinopril (ZESTRIL) 5 MG tablet Take 1 tablet by mouth once daily 90 tablet 0   rivaroxaban (XARELTO) 10 MG TABS tablet Take 1 tablet (10 mg total) by mouth daily. (Patient taking differently: Take 20 mg by mouth daily.) 90 tablet 2   valACYclovir (VALTREX) 500 MG tablet Take 1 tablet (500 mg total) by mouth daily. 90 tablet 3   No current facility-administered medications for this visit.    REVIEW OF SYSTEMS:   Constitutional: ( - ) fevers, ( - )  chills , ( - ) night sweats Eyes: ( - ) blurriness of vision, ( - ) double vision, ( - ) watery eyes Ears, nose, mouth, throat, and face: ( - ) mucositis, ( - ) sore throat Respiratory: ( - ) cough, ( - ) dyspnea, ( - ) wheezes Cardiovascular: ( - ) palpitation, ( - ) chest discomfort, ( - ) lower extremity swelling Gastrointestinal:  ( - ) nausea, ( - ) heartburn, ( - ) change in bowel habits Skin: ( - ) abnormal skin rashes Lymphatics: ( - ) new lymphadenopathy, ( - ) easy bruising Neurological: ( - ) tingling, ( - ) new weaknesses. + chronic neuropathy in lower extremities Behavioral/Psych: ( - ) mood change, ( - ) new changes  All other systems were reviewed with the  patient and are negative.  PHYSICAL EXAMINATION: ECOG PERFORMANCE STATUS: 0 - Asymptomatic  Vitals:   12/07/21 0956  BP: 126/77  Pulse: (!) 46  Resp: 15  Temp: (!) 97.2 F (36.2 C)  SpO2: 92%     Filed Weights   12/07/21 0956  Weight: 208 lb 1.6 oz (94.4 kg)    GENERAL: well appearing elderly Caucasian male, appears younger than stated age, in NAD  SKIN: skin color, texture, turgor are normal, no rashes or significant lesions EYES: conjunctiva are pink and non-injected, sclera clear LUNGS: clear to auscultation and percussion with normal breathing effort HEART: regular rate & rhythm and no murmurs. No LE edema or erythema. Musculoskeletal: no cyanosis of digits and no clubbing  PSYCH: alert & oriented x 3, fluent speech NEURO: no focal motor/sensory deficits  LABORATORY DATA:  I have reviewed the data as listed    Latest Ref Rng & Units 12/07/2021    9:34 AM 09/07/2021   10:30 AM 08/01/2021    9:55 AM  CBC  WBC 4.0 - 10.5 K/uL 7.0  7.1  7.7   Hemoglobin 13.0 - 17.0 g/dL 14.9  14.3  14.0   Hematocrit 39.0 - 52.0 % 42.9  42.9  41.2   Platelets 150 - 400 K/uL 222  227.0  223.0        Latest Ref Rng & Units 09/07/2021   10:30 AM 08/01/2021    3:24 PM 08/01/2021    9:55 AM  CMP  Glucose 70 - 99 mg/dL 113   124   BUN 6 - 23 mg/dL 24   19   Creatinine 0.40 - 1.50 mg/dL 1.07  1.10  1.33   Sodium 135 - 145 mEq/L 137   141   Potassium 3.5 - 5.1 mEq/L 4.0   4.0  Chloride 96 - 112 mEq/L 109   108   CO2 19 - 32 mEq/L 20   27   Calcium 8.4 - 10.5 mg/dL 10.3   9.7   Total Protein 6.0 - 8.3 g/dL 6.9   6.6   Total Bilirubin 0.2 - 1.2 mg/dL 0.8   0.7   Alkaline Phos 39 - 117 U/L 72   79   AST 0 - 37 U/L 31   15   ALT 0 - 53 U/L 37   19      PATHOLOGY:     RADIOGRAPHIC STUDIES: I have personally reviewed the radiological images as listed and agreed with the findings in the report. No results found.   ASSESSMENT & PLAN Taylor Berger 76 y.o. male with medical history  significant for HTN, HLD, DVT ,GERD, and follicular lymphoma (diagnosed 06/2019 and under observation) presents to establish care with oncology for his follicular lymphoma.  After review the labs, review the records, discussion with the patient the findings are most consistent with a low-grade stage I follicular lymphoma of the lymph node of the abdomen.  At this time there is no indication for treatment as there is no bulky disease, no B symptoms, and no cytopenias.  We can continue to follow this patient in clinic with lab work and clinical follow-up.   #Grade IA Follicular Lymphoma --diagnosed in [place] in March 2021. Biopsy confirms Grade IA follicular lymphoma with a Ki-67 of 5-10%. Currently on observation.  --Reviewed labs from today without any intervention needed. CT abdomen/pelvis from 08/01/2021 revealed mesenteric lymphadenopathy measuring 4.3x 2.2 cm, not significantly enlarged from prior. Recommendation is to continue with observation and return in 6 months with lab work. Last CT scan showed stable disease, no indication for repeat imaging. Plan: -- Baseline labs to include CBC, CMP, LDH each visit.  -- labs today show up at the count 7.0, hemoglobin 14.9, MCV 94.5, and platelets of 222 -- no need for routine imaging --continue monitoring with q 6 month clinic visits and labs  #DVT x 2 episodes.  --Initially diagnosed with DVT of the right femoral through popliteal veins on 12/10/2018. Treated with 6 months of anticoagulation therapy. At initial visit, discussed the risks of recurrent VTE and the recommendation for indefinite anticoagulation as it was likely an unprovoked event. Patient elected to continue with observation for recurrent clot. --On 03/30/20, patient had another DVT of the right lower extremity. Doppler US was obtained that revealed acute DVT in the right gastrocnemius veins, right intramuscular calf vein. In addition, there were two pairs of acutely thrombosed veins appear to  arise from the popliteal vein and are thrombosed to the mid calf. No acute DVT in the popliteal vein. There was evidence of chronic deep vein thrombosis involving the right femoral vein, and popliteal vein. Likely provoked by recent episode of diverticulitis. Patient was started on Xarelto 50m PO daily.  --Discussed the recommendation for indefinite anticoagulation due to recurrent episodes of DVTs, with initial episode that was likely unprovoked. Patient was in agreement with this recommendation.  Plan:  --continue Xaretlo 20 mg PO daily until his supply runs out.  Then convert to 10 mg p.o. daily.. Will continue indefinitely.  --RTC in 6 months time  #Health maintenance --Patient follow with GI for Barrett's esophagus.  --Patient is up to date on COVID and influenza vaccinations.   #Right mid thyroid nodule --Underwent fine needle aspiration on 05/31/20, results show benign tissue.   No orders of the defined types  were placed in this encounter.   All questions were answered. The patient knows to call the clinic with any problems, questions or concerns.  Ledell Peoples, MD Department of Hematology/Oncology [place] [facility] Phone: [phone] Pager: [phone] Email: [email or web address]  12/07/2021 10:12 AM

## 2021-12-08 ENCOUNTER — Telehealth: Payer: Self-pay | Admitting: Hematology and Oncology

## 2021-12-08 NOTE — Telephone Encounter (Signed)
Per 9/7 los called and spoke to pt about appointment   pt said if time didn't work he would call back

## 2021-12-14 ENCOUNTER — Encounter: Payer: Self-pay | Admitting: Podiatry

## 2021-12-14 ENCOUNTER — Ambulatory Visit (INDEPENDENT_AMBULATORY_CARE_PROVIDER_SITE_OTHER): Payer: Medicare Other | Admitting: Podiatry

## 2021-12-14 DIAGNOSIS — G629 Polyneuropathy, unspecified: Secondary | ICD-10-CM | POA: Diagnosis not present

## 2021-12-14 DIAGNOSIS — I2510 Atherosclerotic heart disease of native coronary artery without angina pectoris: Secondary | ICD-10-CM

## 2021-12-14 DIAGNOSIS — L309 Dermatitis, unspecified: Secondary | ICD-10-CM | POA: Diagnosis not present

## 2021-12-15 NOTE — Progress Notes (Signed)
Subjective:   Patient ID: Taylor Berger, male   DOB: 76 y.o.   MRN: [id number]   HPI Patient presents stating he is concerned about discoloration of his forefoot bilateral and states that he does not remember injury and did have circulatory studies   ROS      Objective:  Physical Exam  Neurovascular status was found to be intact range of motion adequate subtalar midtarsal joint no indications of neurological disease.  He does have discoloration of his forefoot bilateral in a specific pattern and it appears to be related to shoe gear     Assessment:  Probability for pattern that is related to his shoe gear which is creating a discoloration pattern as he may have exposed to sun     Plan:  Explained this to him and I do not think this will be a problem I do think it will resolve on its own but gave him instructions if it should darken or other pathology were to occur I want to see this

## 2021-12-25 ENCOUNTER — Encounter: Payer: Self-pay | Admitting: *Deleted

## 2022-01-10 ENCOUNTER — Ambulatory Visit: Payer: Medicare Other | Attending: Internal Medicine | Admitting: Internal Medicine

## 2022-01-17 ENCOUNTER — Other Ambulatory Visit: Payer: Self-pay | Admitting: Physician Assistant

## 2022-01-28 NOTE — Progress Notes (Signed)
Cardiology Office Note:    Date:  02/01/2022   ID:  Taylor Berger, DOB 1945-12-07, MRN [id number]  PCP:  Inda Coke, PA  Cardiologist:  Elouise Munroe, MD  Electrophysiologist:  None   Referring MD: Inda Coke, PA   Chief Complaint: routine follow-up of CAD  History of Present Illness:    Taylor Berger is a 76 y.o. male with a history of moderate non-obstructive CAD noted on coronary CTA in 04/2020, thoracic aortic aneurysm, hypertension, hyperlipidemia, recurrent DVT of right lower extremity edema on Xarelto, peripheral neuropathy, goiter, follicular lymphoma, and depression who is followed by Dr. Margaretann Loveless and presents today for routine follow-up.   Patient was referred to Dr. Margaretann Loveless in 02/2020 for further evaluation of occasional substernal non-radiating chest discomfort. Coronary CTA was ordered and was performed in 04/2020 showing a coronary calcium score of 302 (52nd percentile for age and sex) and moderate CAD in the proximal LAD (FFR was negative). Also showed borderline dilation of ascending aorta measuring 38 mm (improved from 42 mm on imaging in 05/2018) and moderately dilated main pulmonary artery, 34 mm, suggestive of pulmonary hypertension. Echo in 04/2020 showed LVEF of 60-65% with mild LVH, grade 1 diastolic dysfunction, mild MR, and non evidence of pulmonary hypertension.   Patient was last seen by Diona Browner, NP, in 05/3021 at which time he was doing well from a cardiac standpoint.   Patient presents today for follow-up. He has a lot of C-spine issues and has had multiple surgeries. Because of this he gets a lot of referred pain to his shoulder and upper chest. However, he says that this is not new. He has had it since his first surgery in 2010 and it is overall stable. It occurs randomly but does not last for long (he estimates a couple of minutes). It is difficult to get him to elaborate on this. He also reports  Intermittent shortness of breath with exertion when he  over-exerts himself.  He denies any shortness of breath with routine activities around the house but states he will occasionally get short of breath when going up the 2 flights of steps in his house. However, he states this does not happen all the time. He denies any shortness of breath at rest. No orthopnea or PND. He notes some mild lower extremity edema at the end of the day but it improves with elevating his legs overnight. No palpitations. He describes some mild lightheadedness/ dizziness with position changes. He states he has a history of vertigo and states he has to be able to see the "horizon" so will have some dizziness if he tries to do something in the dark. However, this is not new and is stable. No syncope. Overall, he sounds stable from a cardiac standpoint.    Past Medical History:  Diagnosis Date   Allergy    Seasonal   Aortic aneurysm (HCC)    Depression    DVT (deep venous thrombosis) ([place]) 2020   GERD (gastroesophageal reflux disease)    Goiter    Hyperlipidemia    Hypertension    Lymphoma ([place]) 0258   Follicular, stomach   Neuropathy of both feet     Past Surgical History:  Procedure Laterality Date   CARPAL TUNNEL RELEASE Left    CERVICAL DISCECTOMY  1995   C-7   CERVICAL FUSION  01/1989   C-3 through C-6 / no metal   COLONOSCOPY     ELBOW SURGERY Left    FOOT SURGERY Left  1999   Plantar fascitis   low back fusion  2013   Lumbar & Low Thoracic with metal   SHOULDER ARTHROSCOPY WITH ROTATOR CUFF REPAIR Right 2014   UPPER GASTROINTESTINAL ENDOSCOPY      Current Medications: Current Meds  Medication Sig   amLODipine (NORVASC) 10 MG tablet Take 1 tablet by mouth once daily   atorvastatin (LIPITOR) 80 MG tablet Take 1 tablet (80 mg total) by mouth daily.   cetirizine (ZYRTEC) 10 MG tablet Take 10 mg by mouth daily.   clindamycin (CLEOCIN-T) 1 % external solution Apply topically 2 (two) times daily. (Patient taking differently: Apply topically as needed.)    esomeprazole (NEXIUM) 20 MG capsule Take 20 mg by mouth daily at 12 noon.   fluticasone (FLONASE) 50 MCG/ACT nasal spray Place into both nostrils as needed for allergies or rhinitis.   lisinopril (ZESTRIL) 5 MG tablet Take 1 tablet by mouth once daily   rivaroxaban (XARELTO) 10 MG TABS tablet Take 1 tablet (10 mg total) by mouth daily. (Patient taking differently: Take 20 mg by mouth daily.)   valACYclovir (VALTREX) 500 MG tablet Take 1 tablet (500 mg total) by mouth daily.     Allergies:   Patient has no known allergies.   Social History   Socioeconomic History   Marital status: Married    Spouse name: Not on file   Number of children: Not on file   Years of education: Not on file   Highest education level: Not on file  Occupational History   Occupation: retired   Tobacco Use   Smoking status: Never   Smokeless tobacco: Never  Vaping Use   Vaping Use: Never used  Substance and Sexual Activity   Alcohol use: Not Currently   Drug use: Never   Sexual activity: Not Currently  Other Topics Concern   Not on file  Social History Narrative   ** Merged History Encounter **       Moved from [facility] to be closer to son Married   Social Determinants of Health   Financial Resource Strain: Low Risk  (05/19/2021)   Overall Financial Resource Strain (CARDIA)    Difficulty of Paying Living Expenses: Not hard at all  Food Insecurity: No Food Insecurity (05/19/2021)   Hunger Vital Sign    Worried About Running Out of Food in the Last Year: Never true    [place] in the Last Year: Never true  Transportation Needs: No Transportation Needs (05/19/2021)   PRAPARE - Hydrologist (Medical): No    Lack of Transportation (Non-Medical): No  Physical Activity: Inactive (05/19/2021)   Exercise Vital Sign    Days of Exercise per Week: 0 days    Minutes of Exercise per Session: 0 min  Stress: No Stress Concern Present (05/19/2021)   [place]    Feeling of Stress : Not at all  Social Connections: Moderately Isolated (05/19/2021)   Social Connection and Isolation Panel [NHANES]    Frequency of Communication with Friends and Family: Once a week    Frequency of Social Gatherings with Friends and Family: Twice a week    Attends Religious Services: Never    Marine scientist or Organizations: No    Attends Music therapist: Never    Marital Status: Married     Family History: The patient's family history includes Diabetes in his mother; Heart failure in his mother;  Hyperlipidemia in his mother; Hypertension in his mother; Leukemia in his father; Stroke in his father. There is no history of Esophageal cancer, Colon polyps, Pancreatic cancer, Stomach cancer, or Rectal cancer.  ROS:   Please see the history of present illness.     EKGs/Labs/Other Studies Reviewed:    The following studies were reviewed:  Coronary CTA 04/13/2020: Impressions: 1. Moderate CAD in proximal LAD, CADRADS = 3. CT FFR negative. 2. Coronary calcium score is 302, which places the patient in the 52nd percentile for age and sex matched control. 3. Normal coronary origin with right dominance. 4. Borderline dilation of ascending aorta, 39 mm at mid ascending aorta. 5. Moderately dilated main pulmonary artery, 34 mm, suggestive of pulmonary hypertension. _______________  Echocardiogram 04/29/2020: Impressions: 1. Left ventricular ejection fraction, by estimation, is 60 to 65%. The  left ventricle has normal function. The left ventricle has no regional  wall motion abnormalities. There is mild left ventricular hypertrophy.  Left ventricular diastolic parameters  are consistent with Grade I diastolic dysfunction (impaired relaxation).   2. Right ventricular systolic function is normal. The right ventricular  size is normal.   3. The mitral valve is normal in structure. Mild mitral valve   regurgitation. No evidence of mitral stenosis.   4. The aortic valve is normal in structure. Aortic valve regurgitation is  not visualized. No aortic stenosis is present.   5. The inferior vena cava is normal in size with greater than 50%  respiratory variability, suggesting right atrial pressure of 3 mmHg.    EKG:  EKG ordered today. EKG personally reviewed and demonstrates normal sinus rhythm, rate 66 bpm, with sinus arrhythmia vs PACs. 1st degree AV block. No acute ST/T changes. Left axis deviation. QTc 429 ms.  Recent Labs: 02/20/2021: TSH 3.38 12/07/2021: ALT 29; BUN 19; Creatinine 1.04; Hemoglobin 14.9; Platelet Count 222; Potassium 3.8; Sodium 139  Recent Lipid Panel    Component Value Date/Time   CHOL 155 11/23/2020 0942   TRIG 138.0 11/23/2020 0942   HDL 40.20 11/23/2020 0942   CHOLHDL 4 11/23/2020 0942   VLDL 27.6 11/23/2020 0942   LDLCALC 87 11/23/2020 0942    Physical Exam:    Vital Signs: BP 134/70   Pulse 66   Ht '5\' 8"'$  (1.727 m)   Wt 208 lb 9.6 oz (94.6 kg)   SpO2 95%   BMI 31.72 kg/m     Wt Readings from Last 3 Encounters:  02/01/22 208 lb 9.6 oz (94.6 kg)  12/07/21 208 lb 1.6 oz (94.4 kg)  09/28/21 205 lb 6 oz (93.2 kg)     General: 76 y.o. Caucasian male in no acute distress. HEENT: Normocephalic and atraumatic. Sclera clear.  Neck: Supple.  No JVD. Heart: Irregular rhythm with normal rate. Distinct S1 and S2. Soft II/VI systolic murmur. Radial pulses 2+ and equal bilaterally. Lungs: No increased work of breathing. Clear to ausculation bilaterally. No wheezes, rhonchi, or rales.  Abdomen: Soft, non-distended, and non-tender to palpation.  Extremities: No lower extremity edema.    Skin: Warm and dry. Neuro: Alert and oriented x3. No focal deficits. Psych: Normal affect. Responds appropriately.  Assessment:    1. Coronary artery disease involving native coronary artery of native heart without angina pectoris   2. Thoracic aortic aneurysm without  rupture, unspecified part ([place])   3. Hypertension, unspecified type   4. Hyperlipidemia, unspecified hyperlipidemia type   5. Deep vein thrombosis (DVT) of proximal lower extremity, unspecified chronicity, unspecified laterality ([place])  Plan:    CAD Coronary CTA in 04/2020 showed coronary calcium score of 302 (52nd percentile for age and sex) and moderate CAD in the proximal LAD (FFR was negative). - He describes some referred pain from his C-spine issues to shoulder and upper chest but states this is not new and is stable. He states he has had this since his for neck surgery in 2010. - No Aspirin given he is on a DOAC. - Continue high-intensity statin.  - It was difficult to get patient to really elaborate on what he describes as referred pain from his C-spine issues as above. However, he insists this is not new and is stable. I did offer repeat ischemic evaluation to make sure this is not cardiac in nature but he declined. Emphasized the importance of letting us know if his symptoms change or worsen.   Thoracic Aortic Aneurysm CT in 05/2018 showed 4.2cm aneurysm of the ascending thoracic aorta. However, coronary CTA in 04/2020 showed only borderline dilatation of the ascending aorta measuring 3.8cm.  - Continue strict BP control. Not on beta-blocker given some lower heart rates.  Hypertension BP well controlled. Initially 134/70 in the office and then 126/78 on my personal recheck at the end of visit. - Continue Amlodipine '10mg'$  daily and Lisinopril '5mg'$  daily.   Hyperlipidemia Most recent lipid panel in 10/2020: Total Cholesterol 155, Triglycerides 128, HDL 40.1, LDL 87. - Continue Lipitor '80mg'$  daily.  - Patient thinks he recently had his cholesterol checked at his PCPs office. I don't see anything in Epic, Care Everywhere, or on [facility]. Will see if we can have this faxed to [place].  DVT Patient has a history of recurrent DVT of right lower extremity.  - Maintained on Xarelto '10mg'$  daily. Per  Oncology, plan is to continue anticoagulation indefinitely.   Disposition: Follow up in 6 months.   Medication Adjustments/Labs and Tests Ordered: Current medicines are reviewed at length with the patient today.  Concerns regarding medicines are outlined above.  Orders Placed This Encounter  Procedures   EKG 12-Lead   No orders of the defined types were placed in this encounter.   Patient Instructions  Medication Instructions:  Your physician recommends that you continue on your current medications as directed. Please refer to the Current Medication list given to you today.   *If you need a refill on your cardiac medications before your next appointment, please call your pharmacy*  Lab Work: NONE ordered at this time of appointment   If you have labs (blood work) drawn today and your tests are completely normal, you will receive your results only by: [place] (if you have MyChart) OR A paper copy in the mail If you have any lab test that is abnormal or we need to change your treatment, we will call you to review the results.  Testing/Procedures: NONE ordered at this time of appointment   Follow-Up: At [facility], you and your health needs are our priority.  As part of our continuing mission to provide you with exceptional heart care, we have created designated Provider Care Teams.  These Care Teams include your primary Cardiologist (physician) and Advanced Practice Providers (APPs -  Physician Assistants and Nurse Practitioners) who all work together to provide you with the care you need, when you need it.  Your next appointment:   6 month(s)  The format for your next appointment:   In Person  Provider:   Elouise Munroe, MD     Other Instructions  Important Information About Sugar         Signed, Eppie Gibson  02/02/2022 9:36 AM    [facility]

## 2022-02-01 ENCOUNTER — Encounter: Payer: Self-pay | Admitting: Student

## 2022-02-01 ENCOUNTER — Ambulatory Visit: Payer: Medicare Other | Attending: Internal Medicine | Admitting: Student

## 2022-02-01 VITALS — BP 126/78 | HR 66 | Ht 68.0 in | Wt 208.6 lb

## 2022-02-01 DIAGNOSIS — E785 Hyperlipidemia, unspecified: Secondary | ICD-10-CM | POA: Diagnosis not present

## 2022-02-01 DIAGNOSIS — I824Y9 Acute embolism and thrombosis of unspecified deep veins of unspecified proximal lower extremity: Secondary | ICD-10-CM | POA: Diagnosis present

## 2022-02-01 DIAGNOSIS — I2510 Atherosclerotic heart disease of native coronary artery without angina pectoris: Secondary | ICD-10-CM | POA: Insufficient documentation

## 2022-02-01 DIAGNOSIS — I10 Essential (primary) hypertension: Secondary | ICD-10-CM | POA: Diagnosis not present

## 2022-02-01 DIAGNOSIS — I712 Thoracic aortic aneurysm, without rupture, unspecified: Secondary | ICD-10-CM | POA: Diagnosis not present

## 2022-02-01 NOTE — Patient Instructions (Signed)
Medication Instructions:  Your physician recommends that you continue on your current medications as directed. Please refer to the Current Medication list given to you today.   *If you need a refill on your cardiac medications before your next appointment, please call your pharmacy*  Lab Work: NONE ordered at this time of appointment   If you have labs (blood work) drawn today and your tests are completely normal, you will receive your results only by: [place] (if you have MyChart) OR A paper copy in the mail If you have any lab test that is abnormal or we need to change your treatment, we will call you to review the results.  Testing/Procedures: NONE ordered at this time of appointment   Follow-Up: At [facility], you and your health needs are our priority.  As part of our continuing mission to provide you with exceptional heart care, we have created designated Provider Care Teams.  These Care Teams include your primary Cardiologist (physician) and Advanced Practice Providers (APPs -  Physician Assistants and Nurse Practitioners) who all work together to provide you with the care you need, when you need it.  Your next appointment:   6 month(s)  The format for your next appointment:   In Person  Provider:   Elouise Munroe, MD     Other Instructions  Important Information About Sugar

## 2022-02-02 ENCOUNTER — Encounter: Payer: Self-pay | Admitting: Student

## 2022-02-09 ENCOUNTER — Other Ambulatory Visit: Payer: Self-pay | Admitting: Physician Assistant

## 2022-02-09 ENCOUNTER — Ambulatory Visit (INDEPENDENT_AMBULATORY_CARE_PROVIDER_SITE_OTHER): Payer: Medicare Other | Admitting: Physician Assistant

## 2022-02-09 ENCOUNTER — Encounter: Payer: Self-pay | Admitting: Physician Assistant

## 2022-02-09 VITALS — BP 109/77 | HR 57 | Temp 97.8°F | Ht 68.0 in | Wt 206.8 lb

## 2022-02-09 DIAGNOSIS — R051 Acute cough: Secondary | ICD-10-CM

## 2022-02-09 MED ORDER — HYDROCOD POLI-CHLORPHE POLI ER 10-8 MG/5ML PO SUER: 5.0000 mL | Freq: Every evening | ORAL | 0 refills | Status: DC | PRN

## 2022-02-09 NOTE — Patient Instructions (Signed)
There were no vitals filed for this visit.

## 2022-02-09 NOTE — Progress Notes (Signed)
Taylor Berger is a 75 y.o. male here for a new problem.  History of Present Illness:   Chief Complaint  Patient presents with   Cough    Started a couple of weeks ago. Cough is progressing. He has used generic Mucinex from [employer]. He is increasing nasal saline wash. OTC Nyquil and Dayquil. He is coughing more at night.      HPI  Cough Patient is still experiencing cough that onset about 2 weeks ago. Was exposed to a neighbor cutting hay and sx started shortly afterwards. He has been been coughing up yellow/clear sputum.  He was started on doxycycline recently by his dermatologist for a procedure. Today, he reports his cough has not improved. He has continuted to take 100 mg BID, 10 day supply of Doxycycline and thinks this helped.  He has taken Dayquil, Nightquil, Flonase. Denies fever, leg swelling, and no reported shortness of breath.     Past Medical History:  Diagnosis Date   Allergy    Seasonal   Aortic aneurysm (HCC)    Depression    DVT (deep venous thrombosis) ([place]) 2020   GERD (gastroesophageal reflux disease)    Goiter    Hyperlipidemia    Hypertension    Lymphoma ([place]) 6789   Follicular, stomach   Neuropathy of both feet      Social History   Tobacco Use   Smoking status: Never   Smokeless tobacco: Never  Vaping Use   Vaping Use: Never used  Substance Use Topics   Alcohol use: Not Currently   Drug use: Never    Past Surgical History:  Procedure Laterality Date   CARPAL TUNNEL RELEASE Left    CERVICAL DISCECTOMY  1995   C-7   CERVICAL FUSION  01/1989   C-3 through C-6 / no metal   COLONOSCOPY     ELBOW SURGERY Left    FOOT SURGERY Left 1999   Plantar fascitis   low back fusion  2013   Lumbar & Low Thoracic with metal   SHOULDER ARTHROSCOPY WITH ROTATOR CUFF REPAIR Right 2014   UPPER GASTROINTESTINAL ENDOSCOPY      Family History  Problem Relation Age of Onset   Diabetes Mother    Hypertension Mother    Hyperlipidemia Mother    Heart  failure Mother    Leukemia Father    Stroke Father    Esophageal cancer Neg Hx    Colon polyps Neg Hx    Pancreatic cancer Neg Hx    Stomach cancer Neg Hx    Rectal cancer Neg Hx     No Known Allergies  Current Medications:   Current Outpatient Medications:    amLODipine (NORVASC) 10 MG tablet, Take 1 tablet by mouth once daily, Disp: 90 tablet, Rfl: 0   atorvastatin (LIPITOR) 80 MG tablet, Take 1 tablet (80 mg total) by mouth daily., Disp: 90 tablet, Rfl: 3   cetirizine (ZYRTEC) 10 MG tablet, Take 10 mg by mouth daily., Disp: , Rfl:    chlorpheniramine-HYDROcodone (TUSSIONEX) 10-8 MG/5ML, Take 5 mLs by mouth at bedtime as needed for cough., Disp: 120 mL, Rfl: 0   clindamycin (CLEOCIN-T) 1 % external solution, Apply topically 2 (two) times daily. (Patient taking differently: Apply topically as needed.), Disp: 30 mL, Rfl: 0   doxycycline (VIBRAMYCIN) 100 MG capsule, Take 100 mg by mouth 2 (two) times daily., Disp: , Rfl:    esomeprazole (NEXIUM) 20 MG capsule, Take 20 mg by mouth daily at 12 noon., Disp: ,  Rfl:    fluticasone (FLONASE) 50 MCG/ACT nasal spray, Place into both nostrils as needed for allergies or rhinitis., Disp: , Rfl:    lisinopril (ZESTRIL) 5 MG tablet, Take 1 tablet by mouth once daily, Disp: 90 tablet, Rfl: 0   rivaroxaban (XARELTO) 10 MG TABS tablet, Take 1 tablet (10 mg total) by mouth daily. (Patient taking differently: Take 20 mg by mouth daily.), Disp: 90 tablet, Rfl: 2   valACYclovir (VALTREX) 500 MG tablet, Take 1 tablet (500 mg total) by mouth daily., Disp: 90 tablet, Rfl: 3   Review of Systems:   Review of Systems  Constitutional:  Negative for chills, fever, malaise/fatigue and weight loss.  HENT:  Negative for hearing loss, sinus pain and sore throat.   Respiratory:  Positive for cough and sputum production. Negative for hemoptysis and shortness of breath.   Cardiovascular:  Negative for chest pain, palpitations, leg swelling and PND.  Gastrointestinal:   Negative for abdominal pain, constipation, diarrhea, heartburn, nausea and vomiting.  Genitourinary:  Negative for dysuria, frequency and urgency.  Musculoskeletal:  Negative for back pain, myalgias and neck pain.  Skin:  Negative for itching and rash.  Neurological:  Negative for dizziness, tingling, seizures and headaches.  Endo/Heme/Allergies:  Negative for polydipsia.  Psychiatric/Behavioral:  Negative for depression. The patient is not nervous/anxious.     Vitals:   Vitals:   02/09/22 1450  BP: 109/77  Pulse: (!) 57  Temp: 97.8 F (36.6 C)  TempSrc: Temporal  SpO2: 93%  Weight: 206 lb 12.8 oz (93.8 kg)  Height: '5\' 8"'$  (1.727 m)     Body mass index is 31.44 kg/m.  Physical Exam:   Physical Exam Vitals and nursing note reviewed.  Constitutional:      General: He is not in acute distress.    Appearance: He is well-developed. He is not ill-appearing or toxic-appearing.  Cardiovascular:     Rate and Rhythm: Normal rate and regular rhythm.     Pulses: Normal pulses.     Heart sounds: Normal heart sounds, S1 normal and S2 normal.  Pulmonary:     Effort: Pulmonary effort is normal.     Breath sounds: Normal breath sounds.  Skin:    General: Skin is warm and dry.  Neurological:     Mental Status: He is alert.     GCS: GCS eye subscore is 4. GCS verbal subscore is 5. GCS motor subscore is 6.  Psychiatric:        Speech: Speech normal.        Behavior: Behavior normal. Behavior is cooperative.     Assessment and Plan:   Acute cough No red flags on exam.  I do not feel like he needs additional abx at this time. Will initiate tussionex per orders. Sleepy precautions advised. Discussed taking medications as prescribed. Reviewed return precautions including worsening fever, SOB, worsening cough or other concerns. Push fluids and rest. I recommend that patient follow-up if symptoms worsen or persist despite treatment x 7-10 days, sooner if needed.  I,Moesha Myer,acting as a  Education administrator for [employer], PA.,have documented all relevant documentation on the behalf of Inda Coke, PA,as directed by  Inda Coke, PA while in the presence of Inda Coke, [place].  I, Inda Coke, [place], have reviewed all documentation for this visit. The documentation on 02/09/22 for the exam, diagnosis, procedures, and orders are all accurate and complete.  Inda Coke PA-C

## 2022-02-13 ENCOUNTER — Other Ambulatory Visit: Payer: Self-pay | Admitting: Physician Assistant

## 2022-02-13 MED ORDER — HYDROCOD POLI-CHLORPHE POLI ER 10-8 MG/5ML PO SUER: 5.0000 mL | Freq: Every evening | ORAL | 0 refills | Status: DC | PRN

## 2022-02-13 NOTE — Telephone Encounter (Signed)
Samantha, please resend cough syrup to another pharmacy due to unavailable. I have changed pharmacy in cart per patient. Thanks

## 2022-02-21 ENCOUNTER — Encounter: Payer: Self-pay | Admitting: Physician Assistant

## 2022-02-25 ENCOUNTER — Encounter: Payer: Self-pay | Admitting: Physician Assistant

## 2022-02-26 MED ORDER — CLINDAMYCIN PHOSPHATE 1 % EX SOLN: Freq: Two times a day (BID) | CUTANEOUS | 1 refills | Status: AC | PRN

## 2022-02-27 NOTE — Telephone Encounter (Signed)
Left message on voicemail to call office.  

## 2022-02-28 ENCOUNTER — Encounter: Payer: Self-pay | Admitting: Physician Assistant

## 2022-03-07 ENCOUNTER — Encounter: Payer: Self-pay | Admitting: Physician Assistant

## 2022-03-07 ENCOUNTER — Other Ambulatory Visit: Payer: Self-pay

## 2022-03-22 ENCOUNTER — Ambulatory Visit (INDEPENDENT_AMBULATORY_CARE_PROVIDER_SITE_OTHER): Payer: Medicare Other

## 2022-03-22 ENCOUNTER — Ambulatory Visit (INDEPENDENT_AMBULATORY_CARE_PROVIDER_SITE_OTHER): Payer: Medicare Other | Admitting: Orthopaedic Surgery

## 2022-03-22 DIAGNOSIS — M25561 Pain in right knee: Secondary | ICD-10-CM

## 2022-03-22 DIAGNOSIS — G8929 Other chronic pain: Secondary | ICD-10-CM

## 2022-03-22 NOTE — Progress Notes (Signed)
Chief Complaint: Right knee pain     History of Present Illness:    Taylor Berger is a 76 y.o. male presents today for right knee pain as he is having this over the course of the last 2 weeks.  He did not have any specific traumatic injury although he notes that when he twisted the knee this does feel painful.  He is experiencing it medially and laterally.  Does not feel like the knee is giving out.  He does have an over-the-counter knee sleeve but he is not using this.  He is not able to take any NSAIDs as he does have a history of being on anticoagulation for DVTs.  He is here today for further assessment.  He does endorse some swelling in the right knee    Surgical History:   none  PMH/PSH/Family History/Social History/Meds/Allergies:    Past Medical History:  Diagnosis Date   Allergy    Seasonal   Aortic aneurysm (HCC)    Depression    DVT (deep venous thrombosis) ([place]) 2020   GERD (gastroesophageal reflux disease)    Goiter    Hyperlipidemia    Hypertension    Lymphoma ([place]) [id number]   Follicular, stomach   Neuropathy of both feet    Past Surgical History:  Procedure Laterality Date   CARPAL TUNNEL RELEASE Left    CERVICAL DISCECTOMY  1995   C-7   CERVICAL FUSION  01/1989   C-3 through C-6 / no metal   COLONOSCOPY     ELBOW SURGERY Left    FOOT SURGERY Left 1999   Plantar fascitis   low back fusion  2013   Lumbar & Low Thoracic with metal   SHOULDER ARTHROSCOPY WITH ROTATOR CUFF REPAIR Right 2014   UPPER GASTROINTESTINAL ENDOSCOPY     Social History   Socioeconomic History   Marital status: Married    Spouse name: Not on file   Number of children: Not on file   Years of education: Not on file   Highest education level: Not on file  Occupational History   Occupation: retired   Tobacco Use   Smoking status: Never   Smokeless tobacco: Never  Vaping Use   Vaping Use: Never used  Substance and Sexual Activity   Alcohol use:  Not Currently   Drug use: Never   Sexual activity: Not Currently  Other Topics Concern   Not on file  Social History Narrative   ** Merged History Encounter **       Moved from [place] to be closer to son Married   Social Determinants of Health   Financial Resource Strain: Low Risk  (05/19/2021)   Overall Financial Resource Strain (CARDIA)    Difficulty of Paying Living Expenses: Not hard at all  Food Insecurity: No Food Insecurity (05/19/2021)   Hunger Vital Sign    Worried About Running Out of Food in the Last Year: Never true    [place] in the Last Year: Never true  Transportation Needs: No Transportation Needs (05/19/2021)   PRAPARE - Hydrologist (Medical): No    Lack of Transportation (Non-Medical): No  Physical Activity: Inactive (05/19/2021)   Exercise Vital Sign    Days of Exercise per Week: 0 days    Minutes of Exercise  per Session: 0 min  Stress: No Stress Concern Present (05/19/2021)   [place]    Feeling of Stress : Not at all  Social Connections: Moderately Isolated (05/19/2021)   Social Connection and Isolation Panel [NHANES]    Frequency of Communication with Friends and Family: Once a week    Frequency of Social Gatherings with Friends and Family: Twice a week    Attends Religious Services: Never    Marine scientist or Organizations: No    Attends Music therapist: Never    Marital Status: Married   Family History  Problem Relation Age of Onset   Diabetes Mother    Hypertension Mother    Hyperlipidemia Mother    Heart failure Mother    Leukemia Father    Stroke Father    Esophageal cancer Neg Hx    Colon polyps Neg Hx    Pancreatic cancer Neg Hx    Stomach cancer Neg Hx    Rectal cancer Neg Hx    No Known Allergies Current Outpatient Medications  Medication Sig Dispense Refill   amLODipine (NORVASC) 10 MG tablet Take 1 tablet by  mouth once daily 90 tablet 0   atorvastatin (LIPITOR) 80 MG tablet Take 1 tablet (80 mg total) by mouth daily. 90 tablet 3   cetirizine (ZYRTEC) 10 MG tablet Take 10 mg by mouth daily.     chlorpheniramine-HYDROcodone (TUSSIONEX) 10-8 MG/5ML Take 5 mLs by mouth at bedtime as needed for cough. 120 mL 0   clindamycin (CLEOCIN-T) 1 % external solution Apply topically 2 (two) times daily as needed. 30 mL 1   esomeprazole (NEXIUM) 20 MG capsule Take 20 mg by mouth daily at 12 noon.     fluticasone (FLONASE) 50 MCG/ACT nasal spray Place into both nostrils as needed for allergies or rhinitis.     lisinopril (ZESTRIL) 5 MG tablet Take 1 tablet by mouth once daily 90 tablet 0   rivaroxaban (XARELTO) 10 MG TABS tablet Take 1 tablet (10 mg total) by mouth daily. (Patient taking differently: Take 20 mg by mouth daily.) 90 tablet 2   valACYclovir (VALTREX) 500 MG tablet Take 1 tablet (500 mg total) by mouth daily. 90 tablet 3   No current facility-administered medications for this visit.   No results found.  Review of Systems:   A ROS was performed including pertinent positives and negatives as documented in the HPI.  Physical Exam :   Constitutional: NAD and appears stated age Neurological: Alert and oriented Psych: Appropriate affect and cooperative There were no vitals taken for this visit.   Comprehensive Musculoskeletal Exam:    There is tenderness about the medial and lateral joint space.  No crepitus with range of motion.  Range of motion is from 0 to 130 degrees.  Negative McMurray  Imaging:   Xray (4 view right knee): Normal    I personally reviewed and interpreted the radiographs.   Assessment:   76 y.o. male with right knee pain consistent with a possible meniscal injury although overall he is slowly feeling better.  I did describe that I could perform an ultrasound-guided knee injection to hopefully get him some relief.  He would like to defer this today.  We did discuss the role  for topical Voltaren gel.  He will consider this.  I did also advise on a knee sleeve.  I will plan to see him back as needed  Plan :    -  Return to clinic as needed     I personally saw and evaluated the patient, and participated in the management and treatment plan.  Vanetta Mulders, MD Attending Physician, Orthopedic Surgery  This document was dictated using Dragon voice recognition software. A reasonable attempt at proof reading has been made to minimize errors.

## 2022-04-04 ENCOUNTER — Ambulatory Visit (INDEPENDENT_AMBULATORY_CARE_PROVIDER_SITE_OTHER): Payer: Medicare Other | Admitting: Orthopaedic Surgery

## 2022-04-04 DIAGNOSIS — M2391 Unspecified internal derangement of right knee: Secondary | ICD-10-CM

## 2022-04-04 NOTE — Progress Notes (Signed)
Chief Complaint: Right knee pain     History of Present Illness:   04/04/2022: Presents today as he is having persistent right knee pain and is hoping to be able to get a right knee intra-articular injection.  Taylor Berger is a 77 y.o. male presents today for right knee pain as he is having this over the course of the last 2 weeks.  He did not have any specific traumatic injury although he notes that when he twisted the knee this does feel painful.  He is experiencing it medially and laterally.  Does not feel like the knee is giving out.  He does have an over-the-counter knee sleeve but he is not using this.  He is not able to take any NSAIDs as he does have a history of being on anticoagulation for DVTs.  He is here today for further assessment.  He does endorse some swelling in the right knee    Surgical History:   none  PMH/PSH/Family History/Social History/Meds/Allergies:    Past Medical History:  Diagnosis Date   Allergy    Seasonal   Aortic aneurysm (HCC)    Depression    DVT (deep venous thrombosis) ([place]) 2020   GERD (gastroesophageal reflux disease)    Goiter    Hyperlipidemia    Hypertension    Lymphoma ([place]) [id number]   Follicular, stomach   Neuropathy of both feet    Past Surgical History:  Procedure Laterality Date   CARPAL TUNNEL RELEASE Left    CERVICAL DISCECTOMY  1995   C-7   CERVICAL FUSION  01/1989   C-3 through C-6 / no metal   COLONOSCOPY     ELBOW SURGERY Left    FOOT SURGERY Left 1999   Plantar fascitis   low back fusion  2013   Lumbar & Low Thoracic with metal   SHOULDER ARTHROSCOPY WITH ROTATOR CUFF REPAIR Right 2014   UPPER GASTROINTESTINAL ENDOSCOPY     Social History   Socioeconomic History   Marital status: Married    Spouse name: Not on file   Number of children: Not on file   Years of education: Not on file   Highest education level: Not on file  Occupational History   Occupation: retired   Tobacco  Use   Smoking status: Never   Smokeless tobacco: Never  Vaping Use   Vaping Use: Never used  Substance and Sexual Activity   Alcohol use: Not Currently   Drug use: Never   Sexual activity: Not Currently  Other Topics Concern   Not on file  Social History Narrative   ** Merged History Encounter **       Moved from [place] to be closer to son Married   Social Determinants of Health   Financial Resource Strain: Low Risk  (05/19/2021)   Overall Financial Resource Strain (CARDIA)    Difficulty of Paying Living Expenses: Not hard at all  Food Insecurity: No Food Insecurity (05/19/2021)   Hunger Vital Sign    Worried About Running Out of Food in the Last Year: Never true    [place] in the Last Year: Never true  Transportation Needs: No Transportation Needs (05/19/2021)   PRAPARE - Hydrologist (Medical): No    Lack of Transportation (Non-Medical): No  Physical  Activity: Inactive (05/19/2021)   Exercise Vital Sign    Days of Exercise per Week: 0 days    Minutes of Exercise per Session: 0 min  Stress: No Stress Concern Present (05/19/2021)   [place]    Feeling of Stress : Not at all  Social Connections: Moderately Isolated (05/19/2021)   Social Connection and Isolation Panel [NHANES]    Frequency of Communication with Friends and Family: Once a week    Frequency of Social Gatherings with Friends and Family: Twice a week    Attends Religious Services: Never    Marine scientist or Organizations: No    Attends Music therapist: Never    Marital Status: Married   Family History  Problem Relation Age of Onset   Diabetes Mother    Hypertension Mother    Hyperlipidemia Mother    Heart failure Mother    Leukemia Father    Stroke Father    Esophageal cancer Neg Hx    Colon polyps Neg Hx    Pancreatic cancer Neg Hx    Stomach cancer Neg Hx    Rectal cancer Neg Hx     No Known Allergies Current Outpatient Medications  Medication Sig Dispense Refill   amLODipine (NORVASC) 10 MG tablet Take 1 tablet by mouth once daily 90 tablet 0   atorvastatin (LIPITOR) 80 MG tablet Take 1 tablet (80 mg total) by mouth daily. 90 tablet 3   cetirizine (ZYRTEC) 10 MG tablet Take 10 mg by mouth daily.     chlorpheniramine-HYDROcodone (TUSSIONEX) 10-8 MG/5ML Take 5 mLs by mouth at bedtime as needed for cough. 120 mL 0   clindamycin (CLEOCIN-T) 1 % external solution Apply topically 2 (two) times daily as needed. 30 mL 1   esomeprazole (NEXIUM) 20 MG capsule Take 20 mg by mouth daily at 12 noon.     fluticasone (FLONASE) 50 MCG/ACT nasal spray Place into both nostrils as needed for allergies or rhinitis.     lisinopril (ZESTRIL) 5 MG tablet Take 1 tablet by mouth once daily 90 tablet 0   rivaroxaban (XARELTO) 10 MG TABS tablet Take 1 tablet (10 mg total) by mouth daily. (Patient taking differently: Take 20 mg by mouth daily.) 90 tablet 2   valACYclovir (VALTREX) 500 MG tablet Take 1 tablet (500 mg total) by mouth daily. 90 tablet 3   No current facility-administered medications for this visit.   No results found.  Review of Systems:   A ROS was performed including pertinent positives and negatives as documented in the HPI.  Physical Exam :   Constitutional: NAD and appears stated age Neurological: Alert and oriented Psych: Appropriate affect and cooperative There were no vitals taken for this visit.   Comprehensive Musculoskeletal Exam:    There is tenderness about the medial and lateral joint space.  No crepitus with range of motion.  Range of motion is from 0 to 130 degrees.  Negative McMurray  Imaging:   Xray (4 view right knee): Normal    I personally reviewed and interpreted the radiographs.   Assessment:   77 y.o. male with right knee pain consistent with a possible meniscal injury although overall he is slowly feeling better.  At today's visit he is  requesting a right knee injection.  I will plan to proceed with this.  He will contact me in 4 to 6 weeks if not feeling better at time we will consider MRI.  Plan :    -  Right knee ultrasound-guided injection performed after verbal consent      I personally saw and evaluated the patient, and participated in the management and treatment plan.  Vanetta Mulders, MD Attending Physician, Orthopedic Surgery  This document was dictated using Dragon voice recognition software. A reasonable attempt at proof reading has been made to minimize errors.

## 2022-04-11 ENCOUNTER — Telehealth: Payer: Self-pay | Admitting: Hematology and Oncology

## 2022-04-11 NOTE — Telephone Encounter (Signed)
Called patient to r/s 3/7 appointment due to provider PAL. Patient r/s and notified.

## 2022-04-13 ENCOUNTER — Other Ambulatory Visit: Payer: Self-pay | Admitting: Physician Assistant

## 2022-04-27 ENCOUNTER — Encounter: Payer: Self-pay | Admitting: Physician Assistant

## 2022-04-27 ENCOUNTER — Other Ambulatory Visit: Payer: Self-pay

## 2022-04-27 DIAGNOSIS — I10 Essential (primary) hypertension: Secondary | ICD-10-CM

## 2022-04-27 MED ORDER — LISINOPRIL 5 MG PO TABS: 5.0000 mg | ORAL_TABLET | Freq: Every day | ORAL | 0 refills | Status: DC

## 2022-05-17 NOTE — Progress Notes (Signed)
Subjective:    Taylor Berger is a 77 y.o. male and is here for a follow-up of chronic medical issues.  HPI  Health Maintenance Due  Topic Date Due   Medicare Annual Wellness (AWV)  05/19/2022    Acute Concerns: None  Chronic Issues: Deep Venous Thrombosis Treated with Xarelto 10 mg daily. Regularly sees hematology/oncology Dr Lorenso Courier for this.  Herpes Simplex Outbreaks Compliant with Valtrex 500 mg daily. Denies concerns with medication.  GERD and Barrett's Esophagus Stable with Nexium 20 mg daily at noon.  Hyperlipidemia Treated with Lipitor 80 mg daily.  Hypertension Treated with Norvasc 10 mg daily, Zestril 5 mg daily. Denies any concerns with this. BP currently well controlled Denies LE swelling, numbness, tingling, SOB, chest pain  Lymphoma Follicular lymphoma diagnosed 06/2019. 08/01/21 CT scan showed stable disease, no indication for repeat imaging.  Managed by Dr. Narda Rutherford. Follow-up on 06/13/22.   Health Maintenance: Immunizations -- Due for Covid-19 vaccine. UTD on flu, tetanus, pneumonia, shingles vaccines. Colonoscopy -- Last completed 11/17/20. Results showed perianal hemorrhoids, non-bleeding external and internal hemorrhoids, diverticulosis in sigmoid, descending colon. One 3 mm polyp in descending colon, removed, two 2-3 mm polyps in ascending colon, removed. Examination otherwise normal. Recommended repeat in 2025. PSA --  Lab Results  Component Value Date   PSA 1.63 12/30/2018   Diet -- Trying to eat healthy. Sleep habits -- Poor. Exercise -- Not exercising recently due to taking care of wife and house.  Weight --  Recent weight history Wt Readings from Last 10 Encounters:  05/23/22 210 lb 4 oz (95.4 kg)  02/09/22 206 lb 12.8 oz (93.8 kg)  02/01/22 208 lb 9.6 oz (94.6 kg)  12/07/21 208 lb 1.6 oz (94.4 kg)  09/28/21 205 lb 6 oz (93.2 kg)  09/07/21 201 lb (91.2 kg)  08/31/21 209 lb (94.8 kg)  08/01/21 207 lb 4 oz (94 kg)  06/08/21 208 lb  9.6 oz (94.6 kg)  06/05/21 210 lb (95.3 kg)   Body mass index is 32.93 kg/m.  Mood -- Okay. Believes he might have SAD. Alcohol use --  reports that he does not currently use alcohol.  Tobacco use --  Tobacco Use: Low Risk  (05/23/2022)   Patient History    Smoking Tobacco Use: Never    Smokeless Tobacco Use: Never    Passive Exposure: Not on file    Eligible for Low Dose CT? No  UTD with eye doctor? UTD UTD with dentist? UTD     05/23/2022    9:35 AM  Depression screen PHQ 2/9  Decreased Interest 1  Down, Depressed, Hopeless 1  PHQ - 2 Score 2  Altered sleeping 1  Tired, decreased energy 1  Change in appetite 1  Feeling bad or failure about yourself  0  Trouble concentrating 0  Moving slowly or fidgety/restless 0  Suicidal thoughts 0  PHQ-9 Score 5  Difficult doing work/chores Somewhat difficult    Other providers/specialists: Patient Care Team: Inda Coke, [place] as PCP - General (Physician Assistant) Elouise Munroe, MD as PCP - Cardiology (Cardiology)    PMHx, SurgHx, SocialHx, Medications, and Allergies were reviewed in the Visit Navigator and updated as appropriate.   Past Medical History:  Diagnosis Date   Allergy    Seasonal   Aortic aneurysm (HCC)    Depression    DVT (deep venous thrombosis) ([place]) 2020   GERD (gastroesophageal reflux disease)    Goiter    Hyperlipidemia    Hypertension  Lymphoma ([place]) [id number]   Follicular, stomach   Neuropathy of both feet      Past Surgical History:  Procedure Laterality Date   CARPAL TUNNEL RELEASE Left    CERVICAL DISCECTOMY  1995   C-7   CERVICAL FUSION  01/1989   C-3 through C-6 / no metal   COLONOSCOPY     ELBOW SURGERY Left    FOOT SURGERY Left 1999   Plantar fascitis   low back fusion  2013   Lumbar & Low Thoracic with metal   SHOULDER ARTHROSCOPY WITH ROTATOR CUFF REPAIR Right 2014   UPPER GASTROINTESTINAL ENDOSCOPY       Family History  Problem Relation Age of Onset   Diabetes  Mother    Hypertension Mother    Hyperlipidemia Mother    Heart failure Mother    Leukemia Father    Stroke Father    Esophageal cancer Neg Hx    Colon polyps Neg Hx    Pancreatic cancer Neg Hx    Stomach cancer Neg Hx    Rectal cancer Neg Hx     Social History   Tobacco Use   Smoking status: Never   Smokeless tobacco: Never  Vaping Use   Vaping Use: Never used  Substance Use Topics   Alcohol use: Not Currently   Drug use: Never    Review of Systems:   Review of Systems  Constitutional:  Negative for chills, fever, malaise/fatigue and weight loss.  HENT:  Negative for hearing loss, sinus pain and sore throat.   Respiratory:  Negative for cough and hemoptysis.   Cardiovascular:  Negative for chest pain, palpitations, leg swelling and PND.  Gastrointestinal:  Negative for abdominal pain, constipation, diarrhea, heartburn, nausea and vomiting.  Genitourinary:  Negative for dysuria, frequency and urgency.  Musculoskeletal:  Negative for back pain, myalgias and neck pain.  Skin:  Negative for itching and rash.  Neurological:  Negative for dizziness, tingling, seizures and headaches.  Endo/Heme/Allergies:  Negative for polydipsia.  Psychiatric/Behavioral:  Negative for depression. The patient is not nervous/anxious.     Objective:    Vitals:   05/23/22 0935  BP: 118/60  Pulse: 79  Temp: 98 F (36.7 C)  SpO2: 94%    Body mass index is 32.93 kg/m.  General  Alert, cooperative, no distress, appears stated age  Head:  Normocephalic, without obvious abnormality, atraumatic  Eyes:  PERRL, conjunctiva/corneas clear, EOM's intact, fundi benign, both eyes       Ears:  Normal TM's and external ear canals, both ears  Nose: Nares normal, septum midline, mucosa normal, no drainage or sinus tenderness  Throat: Lips, mucosa, and tongue normal; teeth and gums normal  Neck: Supple, symmetrical, trachea midline, no adenopathy;     thyroid:  No enlargement/tenderness/nodules; no  carotid bruit or JVD  Back:   Symmetric, no curvature, ROM normal, no CVA tenderness  Lungs:   Clear to auscultation bilaterally, respirations unlabored  Chest wall:  No tenderness or deformity  Heart:  Regular rate and rhythm, S1 and S2 normal, no murmur, rub or gallop  Abdomen:   Soft, non-tender, bowel sounds active all four quadrants, no masses, no organomegaly  Extremities: Extremities normal, atraumatic, no cyanosis or edema  Prostate : Deferred   Skin: Skin color, texture, turgor normal, no rashes or lesions  Lymph nodes: Cervical, supraclavicular, and axillary nodes normal  Neurologic: CNII-XII grossly intact. Normal strength, sensation and reflexes throughout   AssessmentPlan:   Primary hypertension Normotensive Continue amlodipine  10 mg daily and lisinopril 5 mg daily Follow-up with cardiology as scheduled  HSV infection Stable with daily valtrex 500 mg Denies concerns  Hyperlipidemia, unspecified hyperlipidemia type Update lipid panel, already on maximum lipitor 80 mg  Acute deep vein thrombosis (DVT) of femoral vein of right lower extremity ([place]) Management per hematology Remains on 10 mg xarelto  Gastroesophageal reflux disease, unspecified whether esophagitis present Stable, per patient Continue nexium 20 mg and follow-up with GI  Follicular lymphoma grade I of intra-abdominal lymph nodes ([place]) Mgmt per hematology  I,Alexander Ruley,acting as a scribe for [employer], PA.,have documented all relevant documentation on the behalf of Inda Coke, PA,as directed by  Inda Coke, PA while in the presence of Inda Coke, [place].  I, Inda Coke, [place], have reviewed all documentation for this visit. The documentation on 05/23/22 for the exam, diagnosis, procedures, and orders are all accurate and complete.  Inda Coke, PA-C [place]

## 2022-05-23 ENCOUNTER — Encounter: Payer: Self-pay | Admitting: Physician Assistant

## 2022-05-23 ENCOUNTER — Ambulatory Visit (INDEPENDENT_AMBULATORY_CARE_PROVIDER_SITE_OTHER): Payer: Medicare Other | Admitting: Physician Assistant

## 2022-05-23 VITALS — BP 118/60 | HR 79 | Temp 98.0°F | Ht 67.0 in | Wt 210.2 lb

## 2022-05-23 DIAGNOSIS — I82411 Acute embolism and thrombosis of right femoral vein: Secondary | ICD-10-CM | POA: Diagnosis not present

## 2022-05-23 DIAGNOSIS — E785 Hyperlipidemia, unspecified: Secondary | ICD-10-CM | POA: Diagnosis not present

## 2022-05-23 DIAGNOSIS — K219 Gastro-esophageal reflux disease without esophagitis: Secondary | ICD-10-CM

## 2022-05-23 DIAGNOSIS — I10 Essential (primary) hypertension: Secondary | ICD-10-CM | POA: Diagnosis not present

## 2022-05-23 DIAGNOSIS — B009 Herpesviral infection, unspecified: Secondary | ICD-10-CM | POA: Diagnosis not present

## 2022-05-23 DIAGNOSIS — C8203 Follicular lymphoma grade I, intra-abdominal lymph nodes: Secondary | ICD-10-CM

## 2022-05-23 LAB — CBC WITH DIFFERENTIAL/PLATELET
Basophils Absolute: 0.1 10*3/uL (ref 0.0–0.1)
Basophils Relative: 0.9 % (ref 0.0–3.0)
Eosinophils Absolute: 0.3 10*3/uL (ref 0.0–0.7)
Eosinophils Relative: 4.2 % (ref 0.0–5.0)
HCT: 42.5 % (ref 39.0–52.0)
Hemoglobin: 14.4 g/dL (ref 13.0–17.0)
Lymphocytes Relative: 26.8 % (ref 12.0–46.0)
Lymphs Abs: 1.8 10*3/uL (ref 0.7–4.0)
MCHC: 33.9 g/dL (ref 30.0–36.0)
MCV: 96 fl (ref 78.0–100.0)
Monocytes Absolute: 0.7 10*3/uL (ref 0.1–1.0)
Monocytes Relative: 10.2 % (ref 3.0–12.0)
Neutro Abs: 3.8 10*3/uL (ref 1.4–7.7)
Neutrophils Relative %: 57.9 % (ref 43.0–77.0)
Platelets: 215 10*3/uL (ref 150.0–400.0)
RBC: 4.43 Mil/uL (ref 4.22–5.81)
RDW: 13.6 % (ref 11.5–15.5)
WBC: 6.6 10*3/uL (ref 4.0–10.5)

## 2022-05-23 MED ORDER — RIVAROXABAN 10 MG PO TABS: 10.0000 mg | ORAL_TABLET | Freq: Every day | ORAL | 2 refills | Status: DC

## 2022-05-23 NOTE — Patient Instructions (Signed)
It was great to see you!  Continue to see your specialists as you are already doing!  Let's follow-up in 1 year, sooner if you have concerns.  ake care,  Inda Coke PA-C

## 2022-05-24 LAB — LIPID PANEL
Cholesterol: 133 mg/dL (ref 0–200)
HDL: 39.4 mg/dL (ref >39.00)
LDL Cholesterol: 71 mg/dL (ref 0–99)
NonHDL: 94.02
Total CHOL/HDL Ratio: 3
Triglycerides: 115 mg/dL (ref 0.0–149.0)
VLDL: 23 mg/dL (ref 0.0–40.0)

## 2022-05-24 LAB — COMPREHENSIVE METABOLIC PANEL
ALT: 25 U/L (ref 0–53)
AST: 21 U/L (ref 0–37)
Albumin: 4.2 g/dL (ref 3.5–5.2)
Alkaline Phosphatase: 67 U/L (ref 39–117)
BUN: 23 mg/dL (ref 6–23)
CO2: 24 mEq/L (ref 19–32)
Calcium: 10.1 mg/dL (ref 8.4–10.5)
Chloride: 107 mEq/L (ref 96–112)
Creatinine, Ser: 1 mg/dL (ref 0.40–1.50)
GFR: 72.25 mL/min (ref >60.00)
Glucose, Bld: 157 mg/dL — ABNORMAL HIGH (ref 70–99)
Potassium: 3.6 mEq/L (ref 3.5–5.1)
Sodium: 141 mEq/L (ref 135–145)
Total Bilirubin: 1.1 mg/dL (ref 0.2–1.2)
Total Protein: 6.5 g/dL (ref 6.0–8.3)

## 2022-06-06 ENCOUNTER — Ambulatory Visit (INDEPENDENT_AMBULATORY_CARE_PROVIDER_SITE_OTHER): Payer: Medicare Other | Admitting: Orthopaedic Surgery

## 2022-06-06 DIAGNOSIS — M542 Cervicalgia: Secondary | ICD-10-CM

## 2022-06-06 NOTE — Progress Notes (Signed)
Chief Complaint: Bilateral numbness in hands     History of Present Illness:   06/06/2022: Presents today with ongoing and worsening bilateral numbness into his ring finger and small finger.  He says that these are spasming and feel like they get stuck.  He does have a history of cubital tunnel release on the left as well as carpal tunnel release but this is now recurrent.  He does have a history of 3 previous cervical decompressions and fusions in his neck the most recent of which was in [place] in 2016.  He is here today for further assessment.  Taylor Berger is a 77 y.o. male presents today for right knee pain as he is having this over the course of the last 2 weeks.  He did not have any specific traumatic injury although he notes that when he twisted the knee this does feel painful.  He is experiencing it medially and laterally.  Does not feel like the knee is giving out.  He does have an over-the-counter knee sleeve but he is not using this.  He is not able to take any NSAIDs as he does have a history of being on anticoagulation for DVTs.  He is here today for further assessment.  He does endorse some swelling in the right knee    Surgical History:   none  PMH/PSH/Family History/Social History/Meds/Allergies:    Past Medical History:  Diagnosis Date   Allergy    Seasonal   Aortic aneurysm (HCC)    Depression    DVT (deep venous thrombosis) ([place]) 2020   GERD (gastroesophageal reflux disease)    Goiter    Hyperlipidemia    Hypertension    Lymphoma ([place]) [id number]   Follicular, stomach   Neuropathy of both feet    Past Surgical History:  Procedure Laterality Date   CARPAL TUNNEL RELEASE Left    CERVICAL DISCECTOMY  1995   C-7   CERVICAL FUSION  01/1989   C-3 through C-6 / no metal   COLONOSCOPY     ELBOW SURGERY Left    FOOT SURGERY Left 1999   Plantar fascitis   low back fusion  2013   Lumbar & Low Thoracic with metal   SHOULDER ARTHROSCOPY  WITH ROTATOR CUFF REPAIR Right 2014   UPPER GASTROINTESTINAL ENDOSCOPY     Social History   Socioeconomic History   Marital status: Married    Spouse name: Not on file   Number of children: Not on file   Years of education: Not on file   Highest education level: Not on file  Occupational History   Occupation: retired   Tobacco Use   Smoking status: Never   Smokeless tobacco: Never  Vaping Use   Vaping Use: Never used  Substance and Sexual Activity   Alcohol use: Not Currently   Drug use: Never   Sexual activity: Not Currently  Other Topics Concern   Not on file  Social History Narrative   ** Merged History Encounter **       Moved from [place] to be closer to son Married   Social Determinants of Health   Financial Resource Strain: Low Risk  (05/19/2021)   Overall Financial Resource Strain (CARDIA)    Difficulty of Paying Living Expenses: Not hard at all  Food Insecurity: No Food Insecurity (  05/19/2021)   Hunger Vital Sign    Worried About Running Out of Food in the Last Year: Never true    [place] in the Last Year: Never true  Transportation Needs: No Transportation Needs (05/19/2021)   PRAPARE - Hydrologist (Medical): No    Lack of Transportation (Non-Medical): No  Physical Activity: Inactive (05/19/2021)   Exercise Vital Sign    Days of Exercise per Week: 0 days    Minutes of Exercise per Session: 0 min  Stress: No Stress Concern Present (05/19/2021)   [place]    Feeling of Stress : Not at all  Social Connections: Moderately Isolated (05/19/2021)   Social Connection and Isolation Panel [NHANES]    Frequency of Communication with Friends and Family: Once a week    Frequency of Social Gatherings with Friends and Family: Twice a week    Attends Religious Services: Never    Marine scientist or Organizations: No    Attends Music therapist: Never     Marital Status: Married   Family History  Problem Relation Age of Onset   Diabetes Mother    Hypertension Mother    Hyperlipidemia Mother    Heart failure Mother    Leukemia Father    Stroke Father    Esophageal cancer Neg Hx    Colon polyps Neg Hx    Pancreatic cancer Neg Hx    Stomach cancer Neg Hx    Rectal cancer Neg Hx    No Known Allergies Current Outpatient Medications  Medication Sig Dispense Refill   amLODipine (NORVASC) 10 MG tablet Take 1 tablet by mouth once daily 90 tablet 0   atorvastatin (LIPITOR) 80 MG tablet Take 1 tablet (80 mg total) by mouth daily. 90 tablet 3   cetirizine (ZYRTEC) 10 MG tablet Take 10 mg by mouth daily.     clindamycin (CLEOCIN-T) 1 % external solution Apply topically 2 (two) times daily as needed. 30 mL 1   esomeprazole (NEXIUM) 20 MG capsule Take 20 mg by mouth daily at 12 noon.     fluticasone (FLONASE) 50 MCG/ACT nasal spray Place into both nostrils as needed for allergies or rhinitis.     lisinopril (ZESTRIL) 5 MG tablet Take 1 tablet (5 mg total) by mouth daily. 90 tablet 0   rivaroxaban (XARELTO) 10 MG TABS tablet Take 1 tablet (10 mg total) by mouth daily. 90 tablet 2   valACYclovir (VALTREX) 500 MG tablet Take 1 tablet (500 mg total) by mouth daily. 90 tablet 3   No current facility-administered medications for this visit.   No results found.  Review of Systems:   A ROS was performed including pertinent positives and negatives as documented in the HPI.  Physical Exam :   Constitutional: NAD and appears stated age Neurological: Alert and oriented Psych: Appropriate affect and cooperative There were no vitals taken for this visit.   Comprehensive Musculoskeletal Exam:    Previous neck incision is well-appearing.  He has got a positive Sp[place].  This is bilateral.  There is weakness in the grip particular on the left compared to the right.  Positive Hoffmann sign  Imaging:   Xray (4 view right knee): Normal    I  personally reviewed and interpreted the radiographs.   Assessment:   77 y.o. male with bilateral small finger and ring finger numbness which is consistent with possible cervical myelopathy versus  radiculopathy.  Given his extensive cervical history and 3 previous fusions I do believe he may have some adjacent segment disease that we would need to explore.  At this time we will plan for an MRI of his cervical spine and follow-up discuss results  Plan :    -Plan for MRI of focal spine and follow-up to discuss results      I personally saw and evaluated the patient, and participated in the management and treatment plan.  Vanetta Mulders, MD Attending Physician, Orthopedic Surgery  This document was dictated using Dragon voice recognition software. A reasonable attempt at proof reading has been made to minimize errors.

## 2022-06-07 ENCOUNTER — Ambulatory Visit: Payer: Medicare Other | Admitting: Hematology and Oncology

## 2022-06-07 ENCOUNTER — Other Ambulatory Visit: Payer: Medicare Other

## 2022-06-12 ENCOUNTER — Other Ambulatory Visit: Payer: Self-pay | Admitting: Hematology and Oncology

## 2022-06-12 DIAGNOSIS — C8203 Follicular lymphoma grade I, intra-abdominal lymph nodes: Secondary | ICD-10-CM

## 2022-06-12 NOTE — Progress Notes (Signed)
[place] [phone]   [phone]  PROGRESS NOTE  Patient Care Team: Inda Coke, [place] as PCP - General (Physician Assistant) Elouise Munroe, MD as PCP - Cardiology (Cardiology)  Hematological/Oncological History # Follicular Lymphoma Stage I Grade IA 1) 05/20/2019: CT scan showed stable mesenteric nodule, largest 4.2 cm and mild periaortic lymphadenopathy.  2) 06/04/2019: biopsy of abdominal lymph node showed grade 1 follicular lymphoma. Observation recommended by his oncologist at [facility] in [place], [place].  3) 03/14/2020: establish care with Dr. Lorenso Courier  4) 04/13/2020: CT scan showed stable mesenteric lymphadenopathy, measuring 3.9 x 1.9 cm. 5) 11/17/2020: presented to ED for abdominal discomfort. Underwent CT A/P and found to have relatively stable lymph node measuring 4.3 x 2.3 cm.  6) 08/01/2021: CT Ab/Pelvis ordered for LLQ pain. Fidings show mesenteric lymph node conglomerate, largest component measuring up to 4.3 x 2.2 cm.  HISTORY OF PRESENTING ILLNESS:  Taylor Berger 77 y.o. male with medical history significant for HTN, HLD, DVT ,GERD, and follicular lymphoma (diagnosed 06/2019 and under observation) presents for a follow up visit for follicular lymphoma and recurrent RLE DVT.   On exam today Taylor Berger notes he has continued on his Xarelto 10 mg p.o. daily without any difficulty.  He reports he does have some occasional cramping in his hands and some neuropathy in his feet.  He is tolerating Xarelto well with no bleeding, bruising, or dark stools.  He reports that he is back on magnesium pills due to the cramping he is having in his legs and elsewhere.  He notes he is not having any bumps, lumps, or weight loss concerning for progression of his lymphoma.  His appetite has been good.  He reports that he does feel "cold all the time".  His weight has been steady at 205 pounds though he notes he would like to be lighter  around the 175-180 range.  Overall he is at his baseline level of health with no questions concerns or complaints today.  He denies any bleeding, bruising, or dark stools.  He denies any fevers, chills, sweats, nausea vomiting or diarrhea.  Otherwise a full point ROS is listed below.   MEDICAL HISTORY:  Past Medical History:  Diagnosis Date   Allergy    Seasonal   Aortic aneurysm (HCC)    Depression    DVT (deep venous thrombosis) ([place]) 2020   GERD (gastroesophageal reflux disease)    Goiter    Hyperlipidemia    Hypertension    Lymphoma ([place]) [id number]   Follicular, stomach   Neuropathy of both feet     SURGICAL HISTORY: Past Surgical History:  Procedure Laterality Date   CARPAL TUNNEL RELEASE Left    CERVICAL DISCECTOMY  1995   C-7   CERVICAL FUSION  01/1989   C-3 through C-6 / no metal   COLONOSCOPY     ELBOW SURGERY Left    FOOT SURGERY Left 1999   Plantar fascitis   low back fusion  2013   Lumbar & Low Thoracic with metal   SHOULDER ARTHROSCOPY WITH ROTATOR CUFF REPAIR Right 2014   UPPER GASTROINTESTINAL ENDOSCOPY      SOCIAL HISTORY: Social History   Socioeconomic History   Marital status: Married    Spouse name: Not on file   Number of children: Not on file   Years of education: Not on file   Highest education level: Not on file  Occupational History   Occupation: retired   Tobacco  Use   Smoking status: Never   Smokeless tobacco: Never  Vaping Use   Vaping Use: Never used  Substance and Sexual Activity   Alcohol use: Not Currently   Drug use: Never   Sexual activity: Not Currently  Other Topics Concern   Not on file  Social History Narrative   ** Merged History Encounter **       Moved from [facility] to be closer to son Married   Social Determinants of Health   Financial Resource Strain: Low Risk  (05/19/2021)   Overall Financial Resource Strain (CARDIA)    Difficulty of Paying Living Expenses: Not hard at all  Food Insecurity: No Food Insecurity  (05/19/2021)   Hunger Vital Sign    Worried About Running Out of Food in the Last Year: Never true    Ran Out of Food in the Last Year: Never true  Transportation Needs: No Transportation Needs (05/19/2021)   PRAPARE - Hydrologist (Medical): No    Lack of Transportation (Non-Medical): No  Physical Activity: Inactive (05/19/2021)   Exercise Vital Sign    Days of Exercise per Week: 0 days    Minutes of Exercise per Session: 0 min  Stress: No Stress Concern Present (05/19/2021)   [place]    Feeling of Stress : Not at all  Social Connections: Moderately Isolated (05/19/2021)   Social Connection and Isolation Panel [NHANES]    Frequency of Communication with Friends and Family: Once a week    Frequency of Social Gatherings with Friends and Family: Twice a week    Attends Religious Services: Never    Marine scientist or Organizations: No    Attends Archivist Meetings: Never    Marital Status: Married  Human resources officer Violence: Not At Risk (05/19/2021)   Humiliation, Afraid, Rape, and Kick questionnaire    Fear of Current or Ex-Partner: No    Emotionally Abused: No    Physically Abused: No    Sexually Abused: No    FAMILY HISTORY: Family History  Problem Relation Age of Onset   Diabetes Mother    Hypertension Mother    Hyperlipidemia Mother    Heart failure Mother    Leukemia Father    Stroke Father    Esophageal cancer Neg Hx    Colon polyps Neg Hx    Pancreatic cancer Neg Hx    Stomach cancer Neg Hx    Rectal cancer Neg Hx     ALLERGIES:  has No Known Allergies.  MEDICATIONS:  Current Outpatient Medications  Medication Sig Dispense Refill   amLODipine (NORVASC) 10 MG tablet Take 1 tablet by mouth once daily 90 tablet 0   atorvastatin (LIPITOR) 80 MG tablet Take 1 tablet (80 mg total) by mouth daily. 90 tablet 3   cetirizine (ZYRTEC) 10 MG tablet Take 10 mg by  mouth daily.     clindamycin (CLEOCIN-T) 1 % external solution Apply topically 2 (two) times daily as needed. 30 mL 1   esomeprazole (NEXIUM) 20 MG capsule Take 20 mg by mouth daily at 12 noon.     fluticasone (FLONASE) 50 MCG/ACT nasal spray Place into both nostrils as needed for allergies or rhinitis.     lisinopril (ZESTRIL) 5 MG tablet Take 1 tablet (5 mg total) by mouth daily. 90 tablet 0   rivaroxaban (XARELTO) 10 MG TABS tablet Take 1 tablet (10 mg total) by mouth daily. 90 tablet  2   valACYclovir (VALTREX) 500 MG tablet Take 1 tablet (500 mg total) by mouth daily. 90 tablet 3   No current facility-administered medications for this visit.    REVIEW OF SYSTEMS:   Constitutional: ( - ) fevers, ( - )  chills , ( - ) night sweats Eyes: ( - ) blurriness of vision, ( - ) double vision, ( - ) watery eyes Ears, nose, mouth, throat, and face: ( - ) mucositis, ( - ) sore throat Respiratory: ( - ) cough, ( - ) dyspnea, ( - ) wheezes Cardiovascular: ( - ) palpitation, ( - ) chest discomfort, ( - ) lower extremity swelling Gastrointestinal:  ( - ) nausea, ( - ) heartburn, ( - ) change in bowel habits Skin: ( - ) abnormal skin rashes Lymphatics: ( - ) new lymphadenopathy, ( - ) easy bruising Neurological: ( - ) tingling, ( - ) new weaknesses. + chronic neuropathy in lower extremities Behavioral/Psych: ( - ) mood change, ( - ) new changes  All other systems were reviewed with the patient and are negative.  PHYSICAL EXAMINATION: ECOG PERFORMANCE STATUS: 0 - Asymptomatic  Vitals:   06/13/22 0841  BP: (!) 131/91  Pulse: 78  Resp: 16  Temp: 97.8 F (36.6 C)  SpO2: 91%      Filed Weights   06/13/22 0841  Weight: 205 lb 3.2 oz (93.1 kg)     GENERAL: well appearing elderly Caucasian male, appears younger than stated age, in NAD  SKIN: skin color, texture, turgor are normal, no rashes or significant lesions EYES: conjunctiva are pink and non-injected, sclera clear LUNGS: clear to  auscultation and percussion with normal breathing effort HEART: regular rate & rhythm and no murmurs. No LE edema or erythema. Musculoskeletal: no cyanosis of digits and no clubbing  PSYCH: alert & oriented x 3, fluent speech NEURO: no focal motor/sensory deficits  LABORATORY DATA:  I have reviewed the data as listed    Latest Ref Rng & Units 06/13/2022    8:28 AM 05/23/2022   10:04 AM 12/07/2021    9:34 AM  CBC  WBC 4.0 - 10.5 K/uL 8.4  6.6  7.0   Hemoglobin 13.0 - 17.0 g/dL 14.4  14.4  14.9   Hematocrit 39.0 - 52.0 % 41.8  42.5  42.9   Platelets 150 - 400 K/uL 210  215.0  222        Latest Ref Rng & Units 06/13/2022    8:28 AM 05/23/2022   10:04 AM 12/07/2021    9:34 AM  CMP  Glucose 70 - 99 mg/dL 107  157  116   BUN 8 - 23 mg/dL 23  23  19    Creatinine 0.61 - 1.24 mg/dL 0.96  1.00  1.04   Sodium 135 - 145 mmol/L 141  141  139   Potassium 3.5 - 5.1 mmol/L 3.4  3.6  3.8   Chloride 98 - 111 mmol/L 107  107  107   CO2 22 - 32 mmol/L 29  24  28    Calcium 8.9 - 10.3 mg/dL 10.0  10.1  10.6   Total Protein 6.5 - 8.1 g/dL 7.0  6.5  7.5   Total Bilirubin 0.3 - 1.2 mg/dL 1.0  1.1  0.9   Alkaline Phos 38 - 126 U/L 74  67  78   AST 15 - 41 U/L 23  21  21    ALT 0 - 44 U/L 32  25  29  PATHOLOGY:     RADIOGRAPHIC STUDIES: I have personally reviewed the radiological images as listed and agreed with the findings in the report. MR Cervical Spine w/o contrast  Result Date: 06/19/2022 CLINICAL DATA:  Pain. Neck pain radiating into bilateral arms with bilateral hand weakness x1 year. History of cervical spine surgery. EXAM: MRI CERVICAL SPINE WITHOUT CONTRAST TECHNIQUE: Multiplanar, multisequence MR imaging of the cervical spine was performed. No intravenous contrast was administered. COMPARISON:  Cervical spine radiographs 11/29/2020. FINDINGS: Alignment: Normal. Vertebrae: Prior C3-C7 ACDF with solid fusion across the intervening disc spaces. Cord: Normal spinal cord signal and volume.  Posterior Fossa, vertebral arteries, paraspinal tissues: Unremarkable. Disc levels: C2-C3: Adjacent segment. Left-greater-than-right facet arthropathy results in moderate left neural foraminal narrowing. No disc herniation or spinal canal stenosis. C3-C4: Prior ACDF. No spinal canal stenosis or neural foraminal narrowing. C4-C5: Prior ACDF. No spinal canal stenosis or neural foraminal narrowing. C5-C6: Prior ACDF. No spinal canal stenosis or neural foraminal narrowing. C6-C7: Prior ACDF and left hemilaminotomy and foraminotomy. No spinal canal stenosis or neural foraminal narrowing. C7-T1: Adjacent segment. Disc bulge and facet arthropathy contribute to moderate bilateral neural foraminal narrowing. No spinal canal stenosis. IMPRESSION: 1. Prior C3-C7 ACDF with solid fusion across the intervening disc spaces. 2. Adjacent segment disease at C2-C3 and C7-T1, with moderate left neural foraminal narrowing at C2-C3 and moderate bilateral neural foraminal narrowing at C7-T1. Electronically Signed   By: Emmit Alexanders M.D.   On: 06/19/2022 14:40     ASSESSMENT & PLAN Taylor Berger 77 y.o. male with medical history significant for HTN, HLD, DVT ,GERD, and follicular lymphoma (diagnosed 06/2019 and under observation) presents to establish care with oncology for his follicular lymphoma.  After review the labs, review the records, discussion with the patient the findings are most consistent with a low-grade stage I follicular lymphoma of the lymph node of the abdomen.  At this time there is no indication for treatment as there is no bulky disease, no B symptoms, and no cytopenias.  We can continue to follow this patient in clinic with lab work and clinical follow-up.   #Grade IA Follicular Lymphoma --diagnosed in [place] in March 2021. Biopsy confirms Grade IA follicular lymphoma with a Ki-67 of 5-10%. Currently on observation.  --Reviewed labs from today without any intervention needed. CT abdomen/pelvis from 08/01/2021  revealed mesenteric lymphadenopathy measuring 4.3x 2.2 cm, not significantly enlarged from prior. Recommendation is to continue with observation and return in 6 months with lab work. Last CT scan showed stable disease, no indication for repeat imaging. Plan: -- Baseline labs to include CBC, CMP, LDH each visit.  -- labs today show white blood cell count 8.4, hemoglobin 14.4, MCV 95.9, and platelets of 210. -- no need for routine imaging unless patient were to develop worsening blood counts or new symptoms.  --continue monitoring with q 6 month clinic visits and labs  #DVT x 2 episodes.  --Initially diagnosed with DVT of the right femoral through popliteal veins on 12/10/2018. Treated with 6 months of anticoagulation therapy. At initial visit, discussed the risks of recurrent VTE and the recommendation for indefinite anticoagulation as it was likely an unprovoked event. Patient elected to continue with observation for recurrent clot. --On 03/30/20, patient had another DVT of the right lower extremity. Doppler US was obtained that revealed acute DVT in the right gastrocnemius veins, right intramuscular calf vein. In addition, there were two pairs of acutely thrombosed veins appear to arise from the popliteal vein and are thrombosed  to the mid calf. No acute DVT in the popliteal vein. There was evidence of chronic deep vein thrombosis involving the right femoral vein, and popliteal vein. Likely provoked by recent episode of diverticulitis. Patient was started on Xarelto 20mg  PO daily.  --Discussed the recommendation for indefinite anticoagulation due to recurrent episodes of DVTs, with initial episode that was likely unprovoked. Patient was in agreement with this recommendation.  Plan:  --continue Xaretlo 10 mg PO daily as maintenance therapy. Will continue indefinitely.  --RTC in 6 months time  #Health maintenance --Patient follow with GI for Barrett's esophagus.  --Patient is up to date on COVID and  influenza vaccinations.   #Right mid thyroid nodule --Underwent fine needle aspiration on 05/31/20, results show benign tissue.   No orders of the defined types were placed in this encounter.   All questions were answered. The patient knows to call the clinic with any problems, questions or concerns.  Ledell Peoples, MD Department of Hematology/Oncology [place] [facility] Phone: [phone] Pager: [phone] Email: [email or web address]  06/21/2022 8:52 AM

## 2022-06-13 ENCOUNTER — Inpatient Hospital Stay: Payer: Medicare Other | Attending: Hematology and Oncology

## 2022-06-13 ENCOUNTER — Inpatient Hospital Stay (HOSPITAL_BASED_OUTPATIENT_CLINIC_OR_DEPARTMENT_OTHER): Payer: Medicare Other | Admitting: Hematology and Oncology

## 2022-06-13 VITALS — BP 131/91 | HR 78 | Temp 97.8°F | Resp 16 | Wt 205.2 lb

## 2022-06-13 DIAGNOSIS — I82411 Acute embolism and thrombosis of right femoral vein: Secondary | ICD-10-CM | POA: Diagnosis not present

## 2022-06-13 DIAGNOSIS — C8203 Follicular lymphoma grade I, intra-abdominal lymph nodes: Secondary | ICD-10-CM

## 2022-06-13 LAB — CBC WITH DIFFERENTIAL (CANCER CENTER ONLY)
Abs Immature Granulocytes: 0.06 10*3/uL (ref 0.00–0.07)
Basophils Absolute: 0.1 10*3/uL (ref 0.0–0.1)
Basophils Relative: 1 %
Eosinophils Absolute: 0.4 10*3/uL (ref 0.0–0.5)
Eosinophils Relative: 4 %
HCT: 41.8 % (ref 39.0–52.0)
Hemoglobin: 14.4 g/dL (ref 13.0–17.0)
Immature Granulocytes: 1 %
Lymphocytes Relative: 25 %
Lymphs Abs: 2.1 10*3/uL (ref 0.7–4.0)
MCH: 33 pg (ref 26.0–34.0)
MCHC: 34.4 g/dL (ref 30.0–36.0)
MCV: 95.9 fL (ref 80.0–100.0)
Monocytes Absolute: 0.9 10*3/uL (ref 0.1–1.0)
Monocytes Relative: 11 %
Neutro Abs: 4.9 10*3/uL (ref 1.7–7.7)
Neutrophils Relative %: 58 %
Platelet Count: 210 10*3/uL (ref 150–400)
RBC: 4.36 MIL/uL (ref 4.22–5.81)
RDW: 13 % (ref 11.5–15.5)
WBC Count: 8.4 10*3/uL (ref 4.0–10.5)
nRBC: 0 % (ref 0.0–0.2)

## 2022-06-13 LAB — CMP (CANCER CENTER ONLY)
ALT: 32 U/L (ref 0–44)
AST: 23 U/L (ref 15–41)
Albumin: 4.2 g/dL (ref 3.5–5.0)
Alkaline Phosphatase: 74 U/L (ref 38–126)
Anion gap: 5 (ref 5–15)
BUN: 23 mg/dL (ref 8–23)
CO2: 29 mmol/L (ref 22–32)
Calcium: 10 mg/dL (ref 8.9–10.3)
Chloride: 107 mmol/L (ref 98–111)
Creatinine: 0.96 mg/dL (ref 0.61–1.24)
GFR, Estimated: >60 mL/min (ref >60)
Glucose, Bld: 107 mg/dL — ABNORMAL HIGH (ref 70–99)
Potassium: 3.4 mmol/L — ABNORMAL LOW (ref 3.5–5.1)
Sodium: 141 mmol/L (ref 135–145)
Total Bilirubin: 1 mg/dL (ref 0.3–1.2)
Total Protein: 7 g/dL (ref 6.5–8.1)

## 2022-06-13 LAB — LACTATE DEHYDROGENASE: LDH: 175 U/L (ref 98–192)

## 2022-06-14 ENCOUNTER — Telehealth: Payer: Self-pay | Admitting: Hematology and Oncology

## 2022-06-14 NOTE — Telephone Encounter (Signed)
Reached out to patient to schedule per 3/13 LOS, patient aware of date and time of appointment.

## 2022-06-15 ENCOUNTER — Ambulatory Visit
Admission: RE | Admit: 2022-06-15 | Discharge: 2022-06-15 | Disposition: A | Discharge status: Home / Self Care | Payer: Medicare Other | Source: Ambulatory Visit | Attending: Orthopaedic Surgery | Admitting: Orthopaedic Surgery

## 2022-06-15 DIAGNOSIS — M542 Cervicalgia: Secondary | ICD-10-CM

## 2022-07-04 ENCOUNTER — Ambulatory Visit (INDEPENDENT_AMBULATORY_CARE_PROVIDER_SITE_OTHER): Payer: Medicare Other | Admitting: Orthopaedic Surgery

## 2022-07-04 DIAGNOSIS — M79644 Pain in right finger(s): Secondary | ICD-10-CM | POA: Diagnosis not present

## 2022-07-04 DIAGNOSIS — M2391 Unspecified internal derangement of right knee: Secondary | ICD-10-CM | POA: Diagnosis not present

## 2022-07-04 NOTE — Progress Notes (Signed)
Chief Complaint: Bilateral numbness in hands     History of Present Illness:   06/06/2022: Today for follow-up of his MRI of the cervical spine.  He states that he is no longer experiencing any type of radiating pain into the hands.  He is experiencing catching and triggering of both small fingers ring fingers middle fingers.  Taylor Berger is a 77 y.o. male presents today for right knee pain as he is having this over the course of the last 2 weeks.  He did not have any specific traumatic injury although he notes that when he twisted the knee this does feel painful.  He is experiencing it medially and laterally.  Does not feel like the knee is giving out.  He does have an over-the-counter knee sleeve but he is not using this.  He is not able to take any NSAIDs as he does have a history of being on anticoagulation for DVTs.  He is here today for further assessment.  He does endorse some swelling in the right knee    Surgical History:   none  PMH/PSH/Family History/Social History/Meds/Allergies:    Past Medical History:  Diagnosis Date  . Allergy    Seasonal  . Aortic aneurysm ([place])   . Depression   . DVT (deep venous thrombosis) ([place]) 2020  . GERD (gastroesophageal reflux disease)   . Goiter   . Hyperlipidemia   . Hypertension   . 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 Neuropathy of both feet    Past Surgical History:  Procedure Laterality Date  . CARPAL TUNNEL RELEASE Left   . CERVICAL DISCECTOMY  1995   C-7  . CERVICAL FUSION  01/1989   C-3 through C-6 / no metal  . COLONOSCOPY    . ELBOW SURGERY Left   . FOOT SURGERY Left 1999   Plantar fascitis  . low back fusion  2013   Lumbar & Low Thoracic with metal  . SHOULDER ARTHROSCOPY WITH ROTATOR CUFF REPAIR Right 2014  . UPPER GASTROINTESTINAL ENDOSCOPY     Social History   Socioeconomic History  . Marital status: Married    Spouse name: Not on file  . Number of children: Not on  file  . Years of education: Not on file  . Highest education level: Not on file  Occupational History  . Occupation: retired   Tobacco Use  . Smoking status: Never  . Smokeless tobacco: Never  Vaping Use  . Vaping Use: Never used  Substance and Sexual Activity  . Alcohol use: Not Currently  . Drug use: Never  . Sexual activity: Not Currently  Other Topics Concern  . Not on file  Social History Narrative   ** Merged History Encounter **       Moved from [facility] to be closer to son Married   Social Determinants of Health   Financial Resource Strain: Low Risk  (05/19/2021)   Overall Financial Resource Strain (CARDIA)   . Difficulty of Paying Living Expenses: Not hard at all  Food Insecurity: No Food Insecurity (05/19/2021)   Hunger Vital Sign   . Worried About Charity fundraiser in the Last Year: Never true   . Ran Out of Food in the Last Year: Never true  Transportation Needs: No Transportation Needs (05/19/2021)   PRAPARE -  Transportation   . Lack of Transportation (Medical): No   . Lack of Transportation (Non-Medical): No  Physical Activity: Inactive (05/19/2021)   Exercise Vital Sign   . Days of Exercise per Week: 0 days   . Minutes of Exercise per Session: 0 min  Stress: No Stress Concern Present (05/19/2021)   [place]   . Feeling of Stress : Not at all  Social Connections: Moderately Isolated (05/19/2021)   Social Connection and Isolation Panel [NHANES]   . Frequency of Communication with Friends and Family: Once a week   . Frequency of Social Gatherings with Friends and Family: Twice a week   . Attends Religious Services: Never   . Active Member of Clubs or Organizations: No   . Attends Archivist Meetings: Never   . Marital Status: Married   Family History  Problem Relation Age of Onset  . Diabetes Mother   . Hypertension Mother   . Hyperlipidemia Mother   . Heart failure Mother   .  Leukemia Father   . Stroke Father   . Esophageal cancer Neg Hx   . Colon polyps Neg Hx   . Pancreatic cancer Neg Hx   . Stomach cancer Neg Hx   . Rectal cancer Neg Hx    No Known Allergies Current Outpatient Medications  Medication Sig Dispense Refill  . amLODipine (NORVASC) 10 MG tablet Take 1 tablet by mouth once daily 90 tablet 0  . atorvastatin (LIPITOR) 80 MG tablet Take 1 tablet (80 mg total) by mouth daily. 90 tablet 3  . cetirizine (ZYRTEC) 10 MG tablet Take 10 mg by mouth daily.    . clindamycin (CLEOCIN-T) 1 % external solution Apply topically 2 (two) times daily as needed. 30 mL 1  . esomeprazole (NEXIUM) 20 MG capsule Take 20 mg by mouth daily at 12 noon.    . fluticasone (FLONASE) 50 MCG/ACT nasal spray Place into both nostrils as needed for allergies or rhinitis.    Marland Kitchen lisinopril (ZESTRIL) 5 MG tablet Take 1 tablet (5 mg total) by mouth daily. 90 tablet 0  . rivaroxaban (XARELTO) 10 MG TABS tablet Take 1 tablet (10 mg total) by mouth daily. 90 tablet 2  . valACYclovir (VALTREX) 500 MG tablet Take 1 tablet (500 mg total) by mouth daily. 90 tablet 3   No current facility-administered medications for this visit.   No results found.  Review of Systems:   A ROS was performed including pertinent positives and negatives as documented in the HPI.  Physical Exam :   Constitutional: NAD and appears stated age Neurological: Alert and oriented Psych: Appropriate affect and cooperative There were no vitals taken for this visit.   Comprehensive Musculoskeletal Exam:    Previous neck incision is well-appearing.  He has got a positive Sp[place].  This is bilateral.  There is weakness in the grip particular on the left compared to the right.  Positive Hoffmann sign  Bilateral tenderness palpation over the A1 pulley of the small finger ring finger middle finger with triggering.  Imaging:   Xray (4 view right knee): Normal  MRI cervical spine: There is a large segmental fusion  from C3-C6 without significant disease  I personally reviewed and interpreted the radiographs.   Assessment:   77 y.o. male with bilateral small finger trigger finger and middle finger clicking and locking.  I do believe that he may have some triggering to this effect I recommended ultrasound-guided injections of  the right small finger ring finger and middle finger.  He would like to proceed with this first.  I will see him back in 1 month and at that time we will talk about his release injections on the left side of the bed Plan :    -Right small finger ring finger middle finger ultrasound-guided injection was performed with verbal send obtained    Procedure Note  [name]             Date of Birth: Jul 16, 1945           MRN: [id number]             Visit Date: 07/04/2022  Procedures: Visit Diagnoses: No diagnosis found.  Small Joint Inj: R small MCP on 07/04/2022 4:47 PM   Small Joint Inj: R ring MCP on 07/04/2022 4:48 PM   Small Joint Inj: R long MCP on 07/04/2022 4:48 PM          I personally saw and evaluated the patient, and participated in the management and treatment plan.  Vanetta Mulders, MD Attending Physician, Orthopedic Surgery  This document was dictated using Dragon voice recognition software. A reasonable attempt at proof reading has been made to minimize errors.

## 2022-07-16 ENCOUNTER — Other Ambulatory Visit: Payer: Self-pay | Admitting: Physician Assistant

## 2022-07-16 ENCOUNTER — Telehealth: Payer: Self-pay | Admitting: Physician Assistant

## 2022-07-16 NOTE — Telephone Encounter (Signed)
Contacted Taylor Berger to schedule their annual wellness visit. Appointment made for 07/23/2022.  Gabriel Cirri [facility] AWV TEAM Direct Dial [phone]

## 2022-07-23 ENCOUNTER — Ambulatory Visit (INDEPENDENT_AMBULATORY_CARE_PROVIDER_SITE_OTHER): Payer: Medicare Other

## 2022-07-23 VITALS — BP 136/82 | HR 72 | Temp 98.4°F | Wt 211.2 lb

## 2022-07-23 DIAGNOSIS — Z0000 Encounter for general adult medical examination without abnormal findings: Secondary | ICD-10-CM

## 2022-07-23 NOTE — Progress Notes (Signed)
Subjective:   Taylor Berger is a 77 y.o. male who presents for Medicare Annual/Subsequent preventive examination.     Patient Medicare AWV questionnaire was completed by the patient on 07/19/22; I have confirmed that all information answered by patient is correct and no changes since this date.    Review of Systems     Cardiac Risk Factors include: advanced age (>31men, >68 women);male gender;hypertension;obesity (BMI >30kg/m2)     Objective:    Today's Vitals   07/23/22 1033  BP: 136/82  Pulse: 72  Temp: 98.4 F (36.9 C)  SpO2: 93%  Weight: 211 lb 3.2 oz (95.8 kg)   Body mass index is 33.08 kg/m.     07/23/2022   10:44 AM 05/19/2021   11:14 AM 04/24/2021   10:55 AM 11/17/2020   10:52 AM 05/23/2020    8:02 AM 05/13/2020   11:17 AM 03/10/2020    7:43 AM  Advanced Directives  Does Patient Have a Medical Advance Directive? Yes Yes No No No No No  Type of Estate agent of [place];Living will Healthcare Power of Attorney       Copy of Healthcare Power of Attorney in Chart? No - copy requested No - copy requested       Would patient like information on creating a medical advance directive?   No - Patient declined  No - Patient declined No - Patient declined No - Patient declined    Current Medications (verified) Outpatient Encounter Medications as of 07/23/2022  Medication Sig   amLODipine (NORVASC) 10 MG tablet Take 1 tablet by mouth once daily   atorvastatin (LIPITOR) 80 MG tablet Take 1 tablet (80 mg total) by mouth daily.   cetirizine (ZYRTEC) 10 MG tablet Take 10 mg by mouth daily.   clindamycin (CLEOCIN-T) 1 % external solution Apply topically 2 (two) times daily as needed.   esomeprazole (NEXIUM) 20 MG capsule Take 20 mg by mouth daily at 12 noon.   fluticasone (FLONASE) 50 MCG/ACT nasal spray Place into both nostrils as needed for allergies or rhinitis.   lisinopril (ZESTRIL) 5 MG tablet Take 1 tablet (5 mg total) by mouth daily.   rivaroxaban  (XARELTO) 10 MG TABS tablet Take 1 tablet (10 mg total) by mouth daily.   valACYclovir (VALTREX) 500 MG tablet Take 1 tablet (500 mg total) by mouth daily.   No facility-administered encounter medications on file as of 07/23/2022.    Allergies (verified) Patient has no known allergies.   History: Past Medical History:  Diagnosis Date   Allergy    Seasonal   Aortic aneurysm    Depression    DVT (deep venous thrombosis) 2020   GERD (gastroesophageal reflux disease)    Goiter    Hyperlipidemia    Hypertension    Lymphoma 2020   Follicular, stomach   Neuropathy of both feet    Past Surgical History:  Procedure Laterality Date   CARPAL TUNNEL RELEASE Left    CERVICAL DISCECTOMY  1995   C-7   CERVICAL FUSION  01/1989   C-3 through C-6 / no metal   COLONOSCOPY     ELBOW SURGERY Left    FOOT SURGERY Left 1999   Plantar fascitis   low back fusion  2013   Lumbar & Low Thoracic with metal   SHOULDER ARTHROSCOPY WITH ROTATOR CUFF REPAIR Right 2014   UPPER GASTROINTESTINAL ENDOSCOPY     Family History  Problem Relation Age of Onset   Diabetes Mother  Hypertension Mother    Hyperlipidemia Mother    Heart failure Mother    Leukemia Father    Stroke Father    Esophageal cancer Neg Hx    Colon polyps Neg Hx    Pancreatic cancer Neg Hx    Stomach cancer Neg Hx    Rectal cancer Neg Hx    Social History   Socioeconomic History   Marital status: Married    Spouse name: Not on file   Number of children: Not on file   Years of education: Not on file   Highest education level: Not on file  Occupational History   Occupation: retired   Tobacco Use   Smoking status: Never   Smokeless tobacco: Never  Vaping Use   Vaping Use: Never used  Substance and Sexual Activity   Alcohol use: Not Currently   Drug use: Never   Sexual activity: Not Currently  Other Topics Concern   Not on file  Social History Narrative   ** Merged History Encounter **       Moved from [facility] to be  closer to son Married   Social Determinants of Health   Financial Resource Strain: Low Risk  (07/19/2022)   Overall Financial Resource Strain (CARDIA)    Difficulty of Paying Living Expenses: Not hard at all  Food Insecurity: No Food Insecurity (05/19/2021)   Hunger Vital Sign    Worried About Running Out of Food in the Last Year: Never true    Ran Out of Food in the Last Year: Never true  Transportation Needs: No Transportation Needs (07/19/2022)   PRAPARE - Administrator, Civil Service (Medical): No    Lack of Transportation (Non-Medical): No  Physical Activity: Insufficiently Active (07/19/2022)   Exercise Vital Sign    Days of Exercise per Week: 1 day    Minutes of Exercise per Session: 20 min  Stress: No Stress Concern Present (07/19/2022)   [employer] of Occupational Health - Occupational Stress Questionnaire    Feeling of Stress : Only a little  Social Connections: Socially Isolated (07/19/2022)   Social Connection and Isolation Panel [NHANES]    Frequency of Communication with Friends and Family: Once a week    Frequency of Social Gatherings with Friends and Family: Once a week    Attends Religious Services: Never    Database administrator or Organizations: No    Attends Engineer, structural: Never    Marital Status: Married    Tobacco Counseling Counseling given: Not Answered   Clinical Intake:  Pre-visit preparation completed: Yes  Pain : No/denies pain     BMI - recorded: 33.08 Nutritional Status: BMI > 30  Obese Nutritional Risks: None Diabetes: No  How often do you need to have someone help you when you read instructions, pamphlets, or other written materials from your doctor or pharmacy?: 1 - Never  Diabetic?no  Interpreter Needed?: No  Information entered by :: Lanier Ensign, LPN   Activities of Daily Living    07/19/2022    9:26 AM  In your present state of health, do you have any difficulty performing the following  activities:  Hearing? 1  Comment slight loss  Vision? 0  Difficulty concentrating or making decisions? 0  Walking or climbing stairs? 0  Dressing or bathing? 0  Doing errands, shopping? 0  Preparing Food and eating ? N  Using the Toilet? N  In the past six months, have you accidently leaked urine? Jeannie Fend  Comment at times  Do you have problems with loss of bowel control? N  Managing your Medications? N  Managing your Finances? N  Housekeeping or managing your Housekeeping? N    Patient Care Team: Jarold Motto, [place] as PCP - General (Physician Assistant) Parke Poisson, MD as PCP - Cardiology (Cardiology)  Indicate any recent Medical Services you may have received from other than [facility] providers in the past year (date may be approximate).     Assessment:   This is a routine wellness examination for [employer].  Hearing/Vision screen Hearing Screening - Comments:: Pt stated slight hearing loss  Vision Screening - Comments:: Pt follows up with Dr Martha Clan [facility] for annual eye exams   Dietary issues and exercise activities discussed: Current Exercise Habits: Home exercise routine, Type of exercise: walking, Time (Minutes): 20, Frequency (Times/Week): 1, Weekly Exercise (Minutes/Week): 20   Goals Addressed             This Visit's Progress    Patient Stated       Lose weight        Depression Screen    07/23/2022   10:42 AM 05/23/2022    9:35 AM 05/19/2021   11:12 AM 05/01/2021   10:32 AM 05/13/2020   11:14 AM 01/29/2020    1:51 PM  PHQ 2/9 Scores  PHQ - 2 Score 0 2 0 0 0 3  PHQ- 9 Score  5    10    Fall Risk    07/19/2022    9:26 AM 05/23/2022    9:34 AM 05/19/2021   11:15 AM 05/13/2020   11:18 AM  Fall Risk   Falls in the past year? 0 1 0 1  Number falls in past yr: 0 0 0 1  Injury with Fall? 0 1 0 0  Risk for fall due to : Impaired vision Impaired balance/gait Impaired vision;Impaired balance/gait Impaired vision;Impaired balance/gait  Follow up Falls  prevention discussed Falls evaluation completed Falls prevention discussed Falls prevention discussed    FALL RISK PREVENTION PERTAINING TO THE HOME:  Any stairs in or around the home? Yes  If so, are there any without handrails? No  Home free of loose throw rugs in walkways, pet beds, electrical cords, etc? Yes  Adequate lighting in your home to reduce risk of falls? Yes   ASSISTIVE DEVICES UTILIZED TO PREVENT FALLS:  Life alert? No  Use of a cane, walker or w/c? No  Grab bars in the bathroom? Yes  Shower chair or bench in shower? No  Elevated toilet seat or a handicapped toilet? No   TIMED UP AND GO:  Was the test performed? Yes .  Length of time to ambulate 10 feet: 10 sec.   Gait steady and fast without use of assistive device  Cognitive Function:        07/23/2022   10:46 AM 05/19/2021   11:18 AM 05/13/2020   11:22 AM  6CIT Screen  What Year? 0 points 0 points 0 points  What month? 0 points 0 points 0 points  What time? 0 points 0 points   Count back from 20 0 points 0 points 0 points  Months in reverse 0 points 0 points 0 points  Repeat phrase 2 points 0 points 2 points  Total Score 2 points 0 points     Immunizations Immunization History  Administered Date(s) Administered   Fluad Quad(high Dose 65+) 01/23/2022   Influenza, High Dose Seasonal PF 01/13/2020, 01/29/2021  Moderna Sars-Covid-2 Vaccination 05/20/2019, 06/17/2019   PFIZER(Purple Top)SARS-COV-2 Vaccination 01/26/2020   Pfizer Covid-19 Vaccine Bivalent Booster 21yrs & up 01/15/2021, 01/22/2022   Pneumococcal Conjugate-13 01/06/2014   Pneumococcal Polysaccharide-23 01/06/2013   Respiratory Syncytial Virus Vaccine,Recomb Aduvanted(Arexvy) 12/07/2021   Tdap 12/10/2015   Zoster Recombinat (Shingrix) 01/27/2019, 04/29/2019    TDAP status: Up to date  Flu Vaccine status: Up to date  Pneumococcal vaccine status: Up to date  Covid-19 vaccine status: Completed vaccines  Qualifies for Shingles  Vaccine? Yes   Zostavax completed Yes   Shingrix Completed?: Yes  Screening Tests Health Maintenance  Topic Date Due   COVID-19 Vaccine (6 - 2023-24 season) 05/24/2023 (Originally 03/19/2022)   INFLUENZA VACCINE  11/01/2022   Medicare Annual Wellness (AWV)  07/23/2023   DTaP/Tdap/Td (2 - Td or Tdap) 12/09/2025   Pneumonia Vaccine 88+ Years old  Completed   Hepatitis C Screening  Completed   Zoster Vaccines- Shingrix  Completed   HPV VACCINES  Aged Out   COLONOSCOPY (Pts 45-48yrs Insurance coverage will need to be confirmed)  Discontinued    Health Maintenance  There are no preventive care reminders to display for this patient.   Colorectal cancer screening: No longer required.    Additional Screening:  Hepatitis C Screening:  Completed 02/03/21  Vision Screening: Recommended annual ophthalmology exams for early detection of glaucoma and other disorders of the eye. Is the patient up to date with their annual eye exam?  Yes  Who is the provider or what is the name of the office in which the patient attends annual eye exams? [facility]  If pt is not established with a provider, would they like to be referred to a provider to establish care? No .   Dental Screening: Recommended annual dental exams for proper oral hygiene  Community Resource Referral / Chronic Care Management: CRR required this visit?  No   CCM required this visit?  No      Plan:     I have personally reviewed and noted the following in the patient's chart:   Medical and social history Use of alcohol, tobacco or illicit drugs  Current medications and supplements including opioid prescriptions. Patient is not currently taking opioid prescriptions. Functional ability and status Nutritional status Physical activity Advanced directives List of other physicians Hospitalizations, surgeries, and ER visits in previous 12 months Vitals Screenings to include cognitive, depression, and falls Referrals and  appointments  In addition, I have reviewed and discussed with patient certain preventive protocols, quality metrics, and best practice recommendations. A written personalized care plan for preventive services as well as general preventive health recommendations were provided to patient.     Marzella Schlein, LPN   07/14/2438   Nurse Notes: none

## 2022-07-23 NOTE — Patient Instructions (Signed)
Taylor Berger , Thank you for taking time to come for your Medicare Wellness Visit. I appreciate your ongoing commitment to your health goals. Please review the following plan we discussed and let me know if I can assist you in the future.   These are the goals we discussed:  Goals      Patient Stated     Lose weight     Patient Stated     Lose weight         This is a list of the screening recommended for you and due dates:  Health Maintenance  Topic Date Due   COVID-19 Vaccine (6 - 2023-24 season) 05/24/2023*   Flu Shot  11/01/2022   Medicare Annual Wellness Visit  07/23/2023   DTaP/Tdap/Td vaccine (2 - Td or Tdap) 12/09/2025   Pneumonia Vaccine  Completed   Hepatitis C Screening: USPSTF Recommendation to screen - Ages 69-79 yo.  Completed   Zoster (Shingles) Vaccine  Completed   HPV Vaccine  Aged Out   Colon Cancer Screening  Discontinued  *Topic was postponed. The date shown is not the original due date.    Advanced directives: Please bring a copy of your health care power of attorney and living will to the office at your convenience.  Conditions/risks identified: lose weight   Next appointment: Follow up in one year for your annual wellness visit.   Preventive Care 53 Years and Older, Male  Preventive care refers to lifestyle choices and visits with your health care provider that can promote health and wellness. What does preventive care include? A yearly physical exam. This is also called an annual well check. Dental exams once or twice a year. Routine eye exams. Ask your health care provider how often you should have your eyes checked. Personal lifestyle choices, including: Daily care of your teeth and gums. Regular physical activity. Eating a healthy diet. Avoiding tobacco and drug use. Limiting alcohol use. Practicing safe sex. Taking low doses of aspirin every day. Taking vitamin and mineral supplements as recommended by your health care provider. What  happens during an annual well check? The services and screenings done by your health care provider during your annual well check will depend on your age, overall health, lifestyle risk factors, and family history of disease. Counseling  Your health care provider may ask you questions about your: Alcohol use. Tobacco use. Drug use. Emotional well-being. Home and relationship well-being. Sexual activity. Eating habits. History of falls. Memory and ability to understand (cognition). Work and work Astronomer. Screening  You may have the following tests or measurements: Height, weight, and BMI. Blood pressure. Lipid and cholesterol levels. These may be checked every 5 years, or more frequently if you are over 27 years old. Skin check. Lung cancer screening. You may have this screening every year starting at age 80 if you have a 30-pack-year history of smoking and currently smoke or have quit within the past 15 years. Fecal occult blood test (FOBT) of the stool. You may have this test every year starting at age 55. Flexible sigmoidoscopy or colonoscopy. You may have a sigmoidoscopy every 5 years or a colonoscopy every 10 years starting at age 49. Prostate cancer screening. Recommendations will vary depending on your family history and other risks. Hepatitis C blood test. Hepatitis B blood test. Sexually transmitted disease (STD) testing. Diabetes screening. This is done by checking your blood sugar (glucose) after you have not eaten for a while (fasting). You may have this done every  1-3 years. Abdominal aortic aneurysm (AAA) screening. You may need this if you are a current or former smoker. Osteoporosis. You may be screened starting at age 20 if you are at high risk. Talk with your health care provider about your test results, treatment options, and if necessary, the need for more tests. Vaccines  Your health care provider may recommend certain vaccines, such as: Influenza vaccine. This  is recommended every year. Tetanus, diphtheria, and acellular pertussis (Tdap, Td) vaccine. You may need a Td booster every 10 years. Zoster vaccine. You may need this after age 90. Pneumococcal 13-valent conjugate (PCV13) vaccine. One dose is recommended after age 9. Pneumococcal polysaccharide (PPSV23) vaccine. One dose is recommended after age 20. Talk to your health care provider about which screenings and vaccines you need and how often you need them. This information is not intended to replace advice given to you by your health care provider. Make sure you discuss any questions you have with your health care provider. Document Released: 04/15/2015 Document Revised: 12/07/2015 Document Reviewed: 01/18/2015 Elsevier Interactive Patient Education  2017 [employer].  Fall Prevention in the Home Falls can cause injuries. They can happen to people of all ages. There are many things you can do to make your home safe and to help prevent falls. What can I do on the outside of my home? Regularly fix the edges of walkways and driveways and fix any cracks. Remove anything that might make you trip as you walk through a door, such as a raised step or threshold. Trim any bushes or trees on the path to your home. Use bright outdoor lighting. Clear any walking paths of anything that might make someone trip, such as rocks or tools. Regularly check to see if handrails are loose or broken. Make sure that both sides of any steps have handrails. Any raised decks and porches should have guardrails on the edges. Have any leaves, snow, or ice cleared regularly. Use sand or salt on walking paths during winter. Clean up any spills in your garage right away. This includes oil or grease spills. What can I do in the bathroom? Use night lights. Install grab bars by the toilet and in the tub and shower. Do not use towel bars as grab bars. Use non-skid mats or decals in the tub or shower. If you need to sit down  in the shower, use a plastic, non-slip stool. Keep the floor dry. Clean up any water that spills on the floor as soon as it happens. Remove soap buildup in the tub or shower regularly. Attach bath mats securely with double-sided non-slip rug tape. Do not have throw rugs and other things on the floor that can make you trip. What can I do in the bedroom? Use night lights. Make sure that you have a light by your bed that is easy to reach. Do not use any sheets or blankets that are too big for your bed. They should not hang down onto the floor. Have a firm chair that has side arms. You can use this for support while you get dressed. Do not have throw rugs and other things on the floor that can make you trip. What can I do in the kitchen? Clean up any spills right away. Avoid walking on wet floors. Keep items that you use a lot in easy-to-reach places. If you need to reach something above you, use a strong step stool that has a grab bar. Keep electrical cords out of the way.  Do not use floor polish or wax that makes floors slippery. If you must use wax, use non-skid floor wax. Do not have throw rugs and other things on the floor that can make you trip. What can I do with my stairs? Do not leave any items on the stairs. Make sure that there are handrails on both sides of the stairs and use them. Fix handrails that are broken or loose. Make sure that handrails are as long as the stairways. Check any carpeting to make sure that it is firmly attached to the stairs. Fix any carpet that is loose or worn. Avoid having throw rugs at the top or bottom of the stairs. If you do have throw rugs, attach them to the floor with carpet tape. Make sure that you have a light switch at the top of the stairs and the bottom of the stairs. If you do not have them, ask someone to add them for you. What else can I do to help prevent falls? Wear shoes that: Do not have high heels. Have rubber bottoms. Are comfortable  and fit you well. Are closed at the toe. Do not wear sandals. If you use a stepladder: Make sure that it is fully opened. Do not climb a closed stepladder. Make sure that both sides of the stepladder are locked into place. Ask someone to hold it for you, if possible. Clearly mark and make sure that you can see: Any grab bars or handrails. First and last steps. Where the edge of each step is. Use tools that help you move around (mobility aids) if they are needed. These include: Canes. Walkers. Scooters. Crutches. Turn on the lights when you go into a dark area. Replace any light bulbs as soon as they burn out. Set up your furniture so you have a clear path. Avoid moving your furniture around. If any of your floors are uneven, fix them. If there are any pets around you, be aware of where they are. Review your medicines with your doctor. Some medicines can make you feel dizzy. This can increase your chance of falling. Ask your doctor what other things that you can do to help prevent falls. This information is not intended to replace advice given to you by your health care provider. Make sure you discuss any questions you have with your health care provider. Document Released: 01/13/2009 Document Revised: 08/25/2015 Document Reviewed: 04/23/2014 Elsevier Interactive Patient Education  2017 [employer].

## 2022-07-25 ENCOUNTER — Other Ambulatory Visit: Payer: Self-pay | Admitting: Physician Assistant

## 2022-07-25 DIAGNOSIS — I10 Essential (primary) hypertension: Secondary | ICD-10-CM

## 2022-08-03 ENCOUNTER — Ambulatory Visit (INDEPENDENT_AMBULATORY_CARE_PROVIDER_SITE_OTHER): Payer: Medicare Other | Admitting: Orthopaedic Surgery

## 2022-08-03 DIAGNOSIS — M65342 Trigger finger, left ring finger: Secondary | ICD-10-CM

## 2022-08-03 DIAGNOSIS — M65352 Trigger finger, left little finger: Secondary | ICD-10-CM | POA: Diagnosis not present

## 2022-08-03 DIAGNOSIS — M65332 Trigger finger, left middle finger: Secondary | ICD-10-CM | POA: Diagnosis not present

## 2022-08-03 NOTE — Progress Notes (Signed)
Chief Complaint: Bilateral numbness in hands     History of Present Illness:   08/03/2022: Today for follow-up and triggering of his left small finger ring finger middle finger.  This is been bothering him similar to the contralateral side.  Contralateral side is much better without any triggering since his previous injections.  Taylor Berger is a 77 y.o. male presents today for right knee pain as he is having this over the course of the last 2 weeks.  He did not have any specific traumatic injury although he notes that when he twisted the knee this does feel painful.  He is experiencing it medially and laterally.  Does not feel like the knee is giving out.  He does have an over-the-counter knee sleeve but he is not using this.  He is not able to take any NSAIDs as he does have a history of being on anticoagulation for DVTs.  He is here today for further assessment.  He does endorse some swelling in the right knee    Surgical History:   none  PMH/PSH/Family History/Social History/Meds/Allergies:    Past Medical History:  Diagnosis Date   Allergy    Seasonal   Aortic aneurysm (HCC)    Depression    DVT (deep venous thrombosis) (HCC) 2020   GERD (gastroesophageal reflux disease)    Goiter    Hyperlipidemia    Hypertension    Lymphoma (HCC) 2020   Follicular, stomach   Neuropathy of both feet    Past Surgical History:  Procedure Laterality Date   CARPAL TUNNEL RELEASE Left    CERVICAL DISCECTOMY  1995   C-7   CERVICAL FUSION  01/1989   C-3 through C-6 / no metal   COLONOSCOPY     ELBOW SURGERY Left    FOOT SURGERY Left 1999   Plantar fascitis   low back fusion  2013   Lumbar & Low Thoracic with metal   SHOULDER ARTHROSCOPY WITH ROTATOR CUFF REPAIR Right 2014   UPPER GASTROINTESTINAL ENDOSCOPY     Social History   Socioeconomic History   Marital status: Married    Spouse name: Not on file   Number of children: Not on file   Years of  education: Not on file   Highest education level: Not on file  Occupational History   Occupation: retired   Tobacco Use   Smoking status: Never   Smokeless tobacco: Never  Vaping Use   Vaping Use: Never used  Substance and Sexual Activity   Alcohol use: Not Currently   Drug use: Never   Sexual activity: Not Currently  Other Topics Concern   Not on file  Social History Narrative   ** Merged History Encounter **       Moved from [place] to be closer to son Married   Social Determinants of Health   Financial Resource Strain: Low Risk  (07/19/2022)   Overall Financial Resource Strain (CARDIA)    Difficulty of Paying Living Expenses: Not hard at all  Food Insecurity: No Food Insecurity (05/19/2021)   Hunger Vital Sign    Worried About Running Out of Food in the Last Year: Never true    Ran Out of Food in the Last Year: Never true  Transportation Needs: No Transportation Needs (07/19/2022)   PRAPARE - Transportation  Lack of Transportation (Medical): No    Lack of Transportation (Non-Medical): No  Physical Activity: Insufficiently Active (07/19/2022)   Exercise Vital Sign    Days of Exercise per Week: 1 day    Minutes of Exercise per Session: 20 min  Stress: No Stress Concern Present (07/19/2022)   [employer] of Occupational Health - Occupational Stress Questionnaire    Feeling of Stress : Only a little  Social Connections: Socially Isolated (07/19/2022)   Social Connection and Isolation Panel [NHANES]    Frequency of Communication with Friends and Family: Once a week    Frequency of Social Gatherings with Friends and Family: Once a week    Attends Religious Services: Never    Database administrator or Organizations: No    Attends Engineer, structural: Never    Marital Status: Married   Family History  Problem Relation Age of Onset   Diabetes Mother    Hypertension Mother    Hyperlipidemia Mother    Heart failure Mother    Leukemia Father    Stroke Father     Esophageal cancer Neg Hx    Colon polyps Neg Hx    Pancreatic cancer Neg Hx    Stomach cancer Neg Hx    Rectal cancer Neg Hx    No Known Allergies Current Outpatient Medications  Medication Sig Dispense Refill   amLODipine (NORVASC) 10 MG tablet Take 1 tablet by mouth once daily 90 tablet 0   atorvastatin (LIPITOR) 80 MG tablet Take 1 tablet (80 mg total) by mouth daily. 90 tablet 3   cetirizine (ZYRTEC) 10 MG tablet Take 10 mg by mouth daily.     clindamycin (CLEOCIN-T) 1 % external solution Apply topically 2 (two) times daily as needed. 30 mL 1   esomeprazole (NEXIUM) 20 MG capsule Take 20 mg by mouth daily at 12 noon.     fluticasone (FLONASE) 50 MCG/ACT nasal spray Place into both nostrils as needed for allergies or rhinitis.     lisinopril (ZESTRIL) 5 MG tablet Take 1 tablet by mouth once daily 90 tablet 0   rivaroxaban (XARELTO) 10 MG TABS tablet Take 1 tablet (10 mg total) by mouth daily. 90 tablet 2   valACYclovir (VALTREX) 500 MG tablet Take 1 tablet (500 mg total) by mouth daily. 90 tablet 3   No current facility-administered medications for this visit.   No results found.  Review of Systems:   A ROS was performed including pertinent positives and negatives as documented in the HPI.  Physical Exam :   Constitutional: NAD and appears stated age Neurological: Alert and oriented Psych: Appropriate affect and cooperative There were no vitals taken for this visit.   Comprehensive Musculoskeletal Exam:    Previous neck incision is well-appearing.  He has got a positive Spurling.  This is bilateral.  There is weakness in the grip particular on the left compared to the right.  Positive Hoffmann sign  Bilateral tenderness palpation over the A1 pulley of the small finger ring finger middle finger with triggering.  Imaging:   Xray (4 view right knee): Normal  MRI cervical spine: There is a large segmental fusion from C3-C6 without significant disease  I personally reviewed  and interpreted the radiographs.   Assessment:   77 y.o. male with bilateral small finger trigger finger and middle finger clicking and locking.  He is requesting left-sided injections today doing overall quite well with the right side.  Will plan to provide this today. Plan :    -  Left small finger ring finger middle finger ultrasound-guided injection was performed with verbal send obtained    Procedure Note  Patient: Latrail Qamar             Date of Birth: 30-Nov-1945           MRN: [id number]             Visit Date: 08/03/2022  Procedures: Visit Diagnoses: No diagnosis found.  Small Joint Inj on 08/03/2022 1:36 PM   Small Joint Inj on 08/03/2022 1:36 PM   Small Joint Inj on 08/03/2022 1:37 PM           I personally saw and evaluated the patient, and participated in the management and treatment plan.  Huel Cote, MD Attending Physician, Orthopedic Surgery  This document was dictated using Dragon voice recognition software. A reasonable attempt at proof reading has been made to minimize errors.

## 2022-08-07 ENCOUNTER — Ambulatory Visit: Payer: Medicare Other | Admitting: Internal Medicine

## 2022-08-14 NOTE — Progress Notes (Unsigned)
Cardiology Clinic Note   Date: 08/15/2022 ID: Taylor Berger, DOB [birth year], MRN [id number]  Primary Cardiologist:  Parke Poisson, MD  Patient Profile    Taylor Berger is a 77 y.o. male who presents to the clinic today for 56-month follow-up.  Past medical history significant for: Nonobstructive CAD. Coronary CTA with FFR 04/13/2020: Coronary calcium score 302 (52nd percentile).  Moderate CAD proximal LAD found to be insignificant per FFR.  Borderline dilatation of ascending aorta 39 mm.  Moderately dilated main pulmonary artery 34 mm suggestive of pulmonary hypertension.  Recommendations for GDMT and aggressive risk factor modification. Echo 04/29/2020: EF 60 to 65%.  Mild LVH.  Grade I DD.  Normal RV function.  Mild MR. Thoracic aortic aneurysm. CTA chest aorta 05/23/2018 performed at an outside facility: Mild aneurysmal dilatation of the ascending thoracic aorta measuring 4.2 cm. DVT. Venous ultrasound 12/10/2018 performed at outside facility: Acute DVT of the right femoral through popliteal veins.  No left lower extremity DVT. Vascular ultrasound 03/30/2020: Acute DVT involving the right gastrocnemius veins and right intramuscular calf vein.  Findings consistent with chronic deep vein thrombosis involving the right femoral vein and right popliteal vein.  2 pairs of acutely thrombosed veins appear to arise from the popliteal vein and are thrombosed to the mid calf.  No acute DVT in the popliteal vein.  No evidence of common femoral vein obstruction on the left. Hypertension. Hyperlipidemia. Lipid panel 05/23/2022: LDL 71, HDL 39, TG 115, total 133. GERD. Neck pain with multiple C-spine surgeries.   History of Present Illness    Taylor Berger was first evaluated by Dr. Jacques Navy on 02/10/2020 to establish cardiac care for hypertension and thoracic aortic aneurysm.  Patient complained of occasional substernal nonradiating chest discomfort.  He underwent coronary CTA with FFR which showed no  significant stenosis.  He continues to be followed for the above outlined history.  Patient was last seen in the office by Marjie Skiff, PA-C on 02/01/2022.  He was stable from a cardiac standpoint and no medication changes were made.  Today, patient is here alone today.  He is doing well. Patient denies shortness of breath or dyspnea on exertion. No chest pain, pressure, or tightness. Denies lower extremity edema, orthopnea, or PND. No palpitations.  He does not do regular exercise.  He plans on starting a walking program with his wife now that the weather is warm.  He is dealing with some GI symptoms for which she is awaiting scheduling with gastroenterology.  He reports he does not add salt to his food but he does eat processed meats about 2 times a week.   ROS: All other systems reviewed and are otherwise negative except as noted in History of Present Illness.  Studies Reviewed    ECG personally reviewed by me today: Sinus bradycardia with marked sinus arrhythmia with first-degree AV block, 54 bpm.  No significant changes from 02/01/2022.      Physical Exam    VS:  BP 132/84   Pulse (!) 54   Ht 5\' 8"  (1.727 m)   Wt 210 lb 3.2 oz (95.3 kg)   SpO2 95%   BMI 31.96 kg/m  , BMI Body mass index is 31.96 kg/m.  GEN: Well nourished, well developed, in no acute distress. Neck: No JVD or carotid bruits. Cardiac:  RRR. No murmurs. No rubs or gallops.   Respiratory:  Respirations regular and unlabored. Clear to auscultation without rales, wheezing or rhonchi. GI: Soft, nontender, nondistended. Extremities: Radials/DP/PT 2+  and equal bilaterally. No clubbing or cyanosis. No edema.  Skin: Warm and dry, no rash. Neuro: Strength intact.  Assessment & Plan    Nonobstructive CAD.  Coronary CTA with [facility] January 2022 showed no significant stenosis with a calcium score of 302.  Patient denies chest pain, pressure, tightness.  He does not exercise regularly but plans on starting a walking program  with his wife now that the weather is warm.  Continue amlodipine, Lipitor, Xarelto. Thoracic aortic aneurysm.  CTA chest aorta February 2020 performed at an outside facility showed mild aneurysmal dilatation of the ascending thoracic aorta 4.2 cm.  Coronary CTA January 2022 showed borderline dilatation of ascending aorta 39 mm.  Patient denies chest pain, abdominal pain, lightheadedness, dizziness, presyncope, syncope.  Continue cholesterol blood pressure control. Hypertension. BP today 132/84.  Patient denies headaches, dizziness or vision changes. Continue amlodipine, lisinopril. Hyperlipidemia.  LDL February 2024 71, at goal.  Continue Lipitor.  Followed by PCP. Recurrent DVT.  Venous ultrasound September 2020 showed acute DVT of the right femoral through popliteal veins.  Vascular ultrasound December 2021 showed acute DVT involving the right gastrocnemius veins and right intramuscular calf vein as well as chronic DVT involving the right femoral vein and right popliteal vein and 2 pairs of acutely thrombosed veins arising from popliteal vein to the mid calf.  Denies spontaneous bleeding concerns.  Continue Xarelto per heme-onc.  Disposition: Salty 6 information sheet provided.  Return in 6 months or sooner as needed.         Signed, Etta Grandchild. Betul Brisky, DNP, NP-C

## 2022-08-15 ENCOUNTER — Encounter: Payer: Self-pay | Admitting: Student

## 2022-08-15 ENCOUNTER — Ambulatory Visit: Payer: Medicare Other | Attending: Internal Medicine | Admitting: Student

## 2022-08-15 VITALS — BP 132/84 | HR 54 | Ht 68.0 in | Wt 210.2 lb

## 2022-08-15 DIAGNOSIS — I10 Essential (primary) hypertension: Secondary | ICD-10-CM | POA: Insufficient documentation

## 2022-08-15 DIAGNOSIS — I82409 Acute embolism and thrombosis of unspecified deep veins of unspecified lower extremity: Secondary | ICD-10-CM | POA: Diagnosis present

## 2022-08-15 DIAGNOSIS — I2510 Atherosclerotic heart disease of native coronary artery without angina pectoris: Secondary | ICD-10-CM | POA: Diagnosis present

## 2022-08-15 DIAGNOSIS — I7121 Aneurysm of the ascending aorta, without rupture: Secondary | ICD-10-CM | POA: Diagnosis present

## 2022-08-15 DIAGNOSIS — E785 Hyperlipidemia, unspecified: Secondary | ICD-10-CM | POA: Diagnosis present

## 2022-08-15 NOTE — Patient Instructions (Signed)
Medication Instructions:  Your physician recommends that you continue on your current medications as directed. Please refer to the Current Medication list given to you today.  *If you need a refill on your cardiac medications before your next appointment, please call your pharmacy*   Lab Work: NONE If you have labs (blood work) drawn today and your tests are completely normal, you will receive your results only by: MyChart Message (if you have MyChart) OR A paper copy in the mail If you have any lab test that is abnormal or we need to change your treatment, we will call you to review the results.   Testing/Procedures: NONE   Follow-Up: At [facility], you and your health needs are our priority.  As part of our continuing mission to provide you with exceptional heart care, we have created designated Provider Care Teams.  These Care Teams include your primary Cardiologist (physician) and Advanced Practice Providers (APPs -  Physician Assistants and Nurse Practitioners) who all work together to provide you with the care you need, when you need it.  We recommend signing up for the patient portal called "MyChart".  Sign up information is provided on this After Visit Summary.  MyChart is used to connect with patients for Virtual Visits (Telemedicine).  Patients are able to view lab/test results, encounter notes, upcoming appointments, etc.  Non-urgent messages can be sent to your provider as well.   To learn more about what you can do with MyChart, go to [email or web address].    Your next appointment:   Please keep scheduled appointment with Dr Jacques Navy.

## 2022-08-20 ENCOUNTER — Ambulatory Visit (INDEPENDENT_AMBULATORY_CARE_PROVIDER_SITE_OTHER): Payer: Medicare Other | Admitting: Physician Assistant

## 2022-08-20 ENCOUNTER — Encounter: Payer: Self-pay | Admitting: Physician Assistant

## 2022-08-20 VITALS — BP 130/80 | HR 93 | Temp 98.0°F | Ht 68.0 in | Wt 206.0 lb

## 2022-08-20 DIAGNOSIS — R194 Change in bowel habit: Secondary | ICD-10-CM

## 2022-08-20 NOTE — Patient Instructions (Signed)
To treat your constipation today: -Take 100 mg of docusate sodium (also known as Colace, however generic is fine!) by mouth twice daily to soften stools -Drink at least 64 oz of water daily -Add in 1 capful of polyethylene glycol (also known as Miralax, however generic is fine!) to beverage of choice daily  Goal is to have a formed, soft bowel movement regularly   -After a few days if no success, may increase to a total of 2 capfuls of polyethylene glycol/ Miralax daily  If still no results, please call the office

## 2022-08-20 NOTE — Progress Notes (Signed)
Taylor Berger is a 77 y.o. male here for a new problem.  History of Present Illness:   Chief Complaint  Patient presents with   Diarrhea    Pt c/o diarrhea 1-2 x a week and constipation.     HPI  Diarrhea/Constipation:  He complains of diarrhea and constipation on and off for a month.  He reports vomiting 2 weeks ago, states it was an isolated event.  He has been taking fiber gummies and chewable probiotics, which he states has started to help a bit.  He is also taking a generic vegetable laxative stool softener. He has not been drinking much water.  He has an appointment with Dr. Tiajuana Amass from gastro on 11/05/2022.  Denies any blood in stool or urinary symptoms.  He endorses flank pain, which he attributes to a pulled muscle that has not fully healed.  He has not been exercising regularly, due to lack of motivation.   Past Medical History:  Diagnosis Date   Allergy    Seasonal   Aortic aneurysm (HCC)    Depression    DVT (deep venous thrombosis) (HCC) 2020   GERD (gastroesophageal reflux disease)    Goiter    Hyperlipidemia    Hypertension    Lymphoma (HCC) 2020   Follicular, stomach   Neuropathy of both feet      Social History   Tobacco Use   Smoking status: Never   Smokeless tobacco: Never  Vaping Use   Vaping Use: Never used  Substance Use Topics   Alcohol use: Not Currently   Drug use: Never    Past Surgical History:  Procedure Laterality Date   CARPAL TUNNEL RELEASE Left    CERVICAL DISCECTOMY  1995   C-7   CERVICAL FUSION  01/1989   C-3 through C-6 / no metal   COLONOSCOPY     ELBOW SURGERY Left    FOOT SURGERY Left 1999   Plantar fascitis   low back fusion  2013   Lumbar & Low Thoracic with metal   SHOULDER ARTHROSCOPY WITH ROTATOR CUFF REPAIR Right 2014   UPPER GASTROINTESTINAL ENDOSCOPY      Family History  Problem Relation Age of Onset   Diabetes Mother    Hypertension Mother    Hyperlipidemia Mother    Heart failure Mother     Leukemia Father    Stroke Father    Esophageal cancer Neg Hx    Colon polyps Neg Hx    Pancreatic cancer Neg Hx    Stomach cancer Neg Hx    Rectal cancer Neg Hx     No Known Allergies  Current Medications:   Current Outpatient Medications:    amLODipine (NORVASC) 10 MG tablet, Take 1 tablet by mouth once daily, Disp: 90 tablet, Rfl: 0   atorvastatin (LIPITOR) 80 MG tablet, Take 1 tablet (80 mg total) by mouth daily., Disp: 90 tablet, Rfl: 3   cetirizine (ZYRTEC) 10 MG tablet, Take 10 mg by mouth daily., Disp: , Rfl:    clindamycin (CLEOCIN-T) 1 % external solution, Apply topically 2 (two) times daily as needed., Disp: 30 mL, Rfl: 1   esomeprazole (NEXIUM) 20 MG capsule, Take 20 mg by mouth daily at 12 noon., Disp: , Rfl:    FIBER ADULT GUMMIES PO, Take 2 each by mouth daily in the afternoon., Disp: , Rfl:    fluticasone (FLONASE) 50 MCG/ACT nasal spray, Place into both nostrils as needed for allergies or rhinitis., Disp: , Rfl:  lisinopril (ZESTRIL) 5 MG tablet, Take 1 tablet by mouth once daily, Disp: 90 tablet, Rfl: 0   Probiotic Product (PROBIOTIC GUMMIES PO), Take 3 each by mouth daily in the afternoon., Disp: , Rfl:    rivaroxaban (XARELTO) 10 MG TABS tablet, Take 1 tablet (10 mg total) by mouth daily., Disp: 90 tablet, Rfl: 2   valACYclovir (VALTREX) 500 MG tablet, Take 1 tablet (500 mg total) by mouth daily., Disp: 90 tablet, Rfl: 3   Review of Systems:   Review of Systems  Gastrointestinal:  Positive for constipation, diarrhea and vomiting.    Vitals:   Vitals:   08/20/22 0942  BP: 130/80  Pulse: 93  Temp: 98 F (36.7 C)  TempSrc: Temporal  SpO2: 95%  Weight: 206 lb (93.4 kg)  Height: 5\' 8"  (1.727 m)     Body mass index is 31.32 kg/m.  Physical Exam:   Physical Exam Vitals and nursing note reviewed.  Constitutional:      General: He is not in acute distress.    Appearance: He is well-developed. He is not ill-appearing or toxic-appearing.   Cardiovascular:     Rate and Rhythm: Normal rate and regular rhythm.     Pulses: Normal pulses.     Heart sounds: Normal heart sounds, S1 normal and S2 normal.  Pulmonary:     Effort: Pulmonary effort is normal.     Breath sounds: Normal breath sounds.  Abdominal:     General: Abdomen is flat. Bowel sounds are normal.     Palpations: Abdomen is soft.     Tenderness: There is no abdominal tenderness.  Skin:    General: Skin is warm and dry.  Neurological:     Mental Status: He is alert.     GCS: GCS eye subscore is 4. GCS verbal subscore is 5. GCS motor subscore is 6.  Psychiatric:        Speech: Speech normal.        Behavior: Behavior normal. Behavior is cooperative.     Assessment and Plan:   Bowel habit changes No red flags Suspect constipation Recommend miralax and colace daily until regular bowel movements occur Follow-up if new/worsening symptoms   I,Rachel Rivera,acting as a scribe for [employer], PA.,have documented all relevant documentation on the behalf of Jarold Motto, PA,as directed by  Jarold Motto, PA while in the presence of Jarold Motto, [place].  I, Jarold Motto, [place], have reviewed all documentation for this visit. The documentation on 08/20/22 for the exam, diagnosis, procedures, and orders are all accurate and complete.   Jarold Motto, PA-C

## 2022-08-28 ENCOUNTER — Encounter: Payer: Self-pay | Admitting: Physician Assistant

## 2022-09-03 DIAGNOSIS — G5623 Lesion of ulnar nerve, bilateral upper limbs: Secondary | ICD-10-CM | POA: Insufficient documentation

## 2022-09-03 DIAGNOSIS — G5603 Carpal tunnel syndrome, bilateral upper limbs: Secondary | ICD-10-CM | POA: Insufficient documentation

## 2022-09-18 IMAGING — DX DG CHEST 2V
2 series · 2 of 2 positions shown · non-contrast
Comparison: None.

CLINICAL DATA: Productive cough.

EXAM:
CHEST - 2 VIEW

[chest pa]
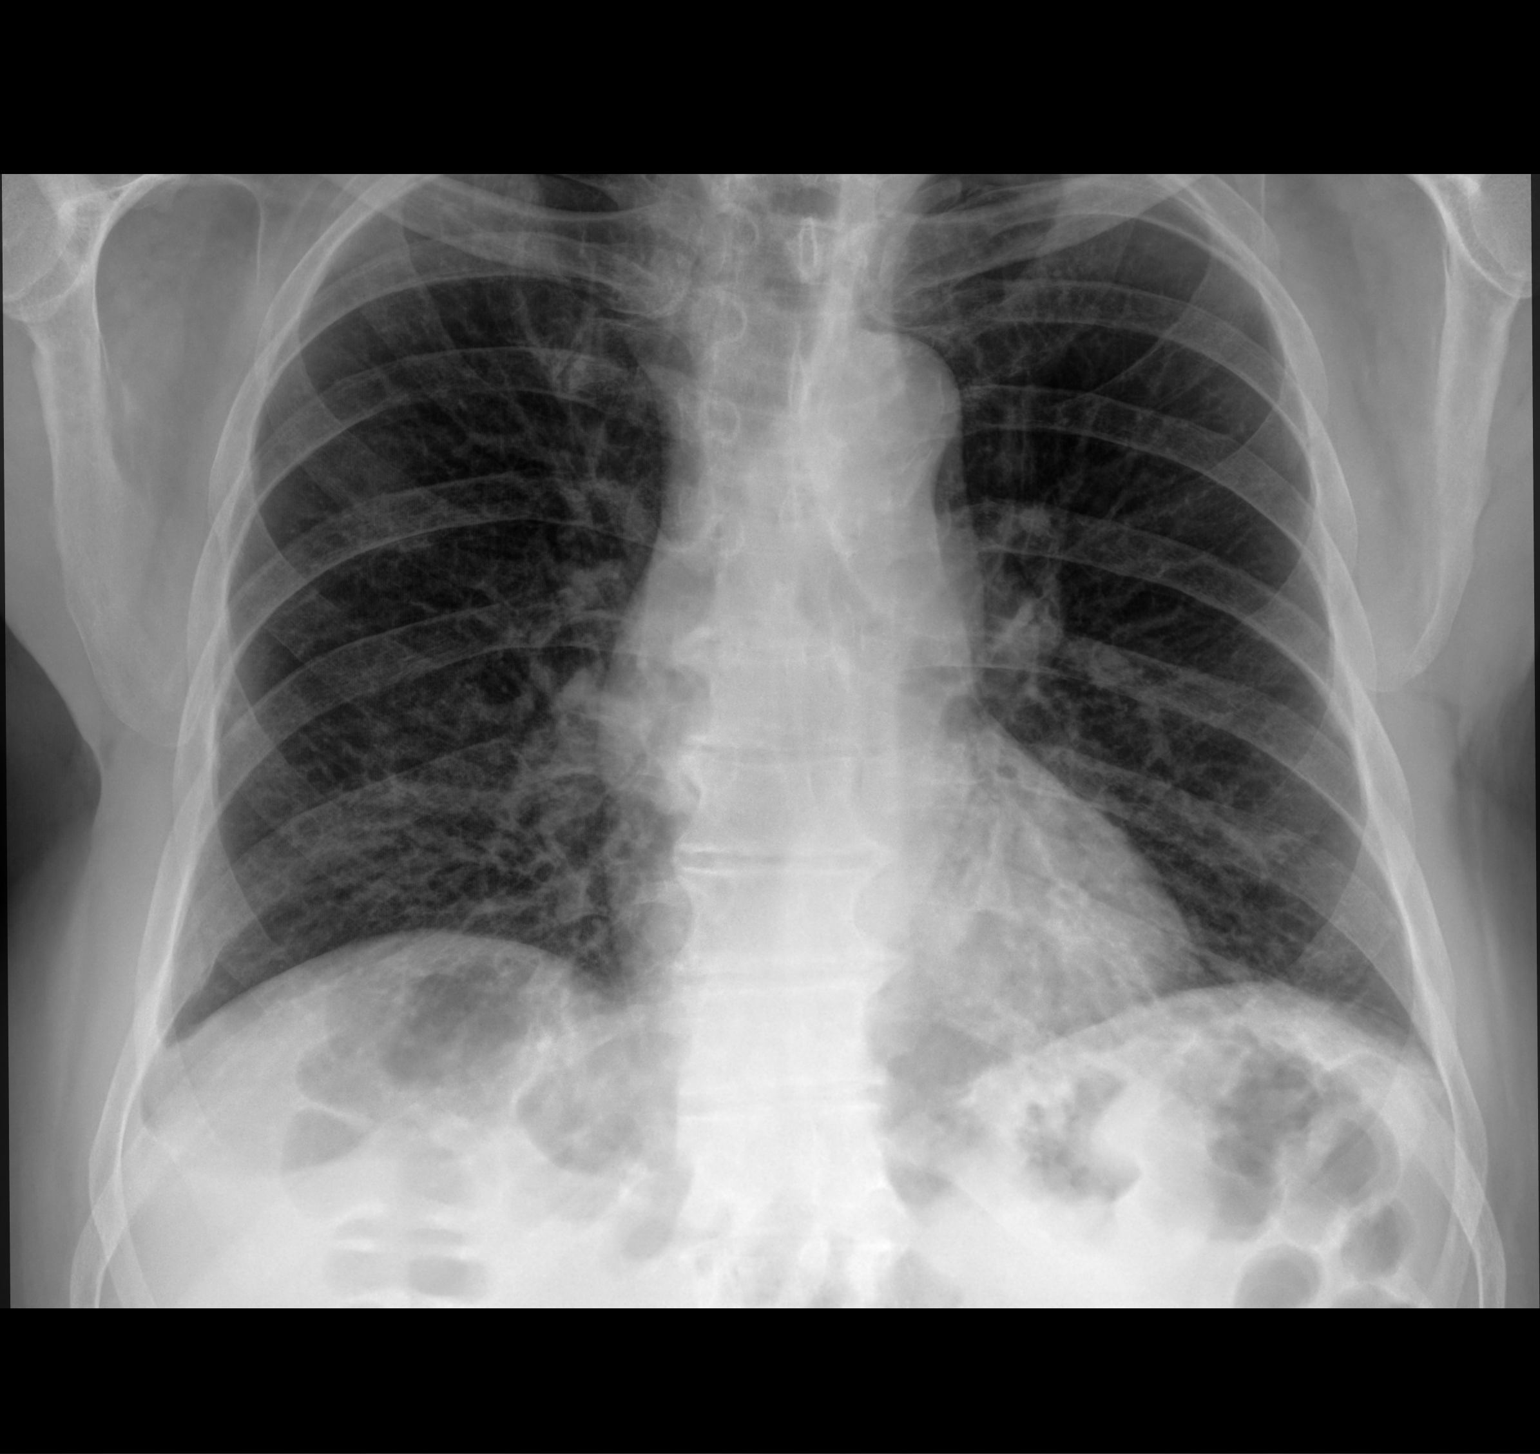

[chest lat]
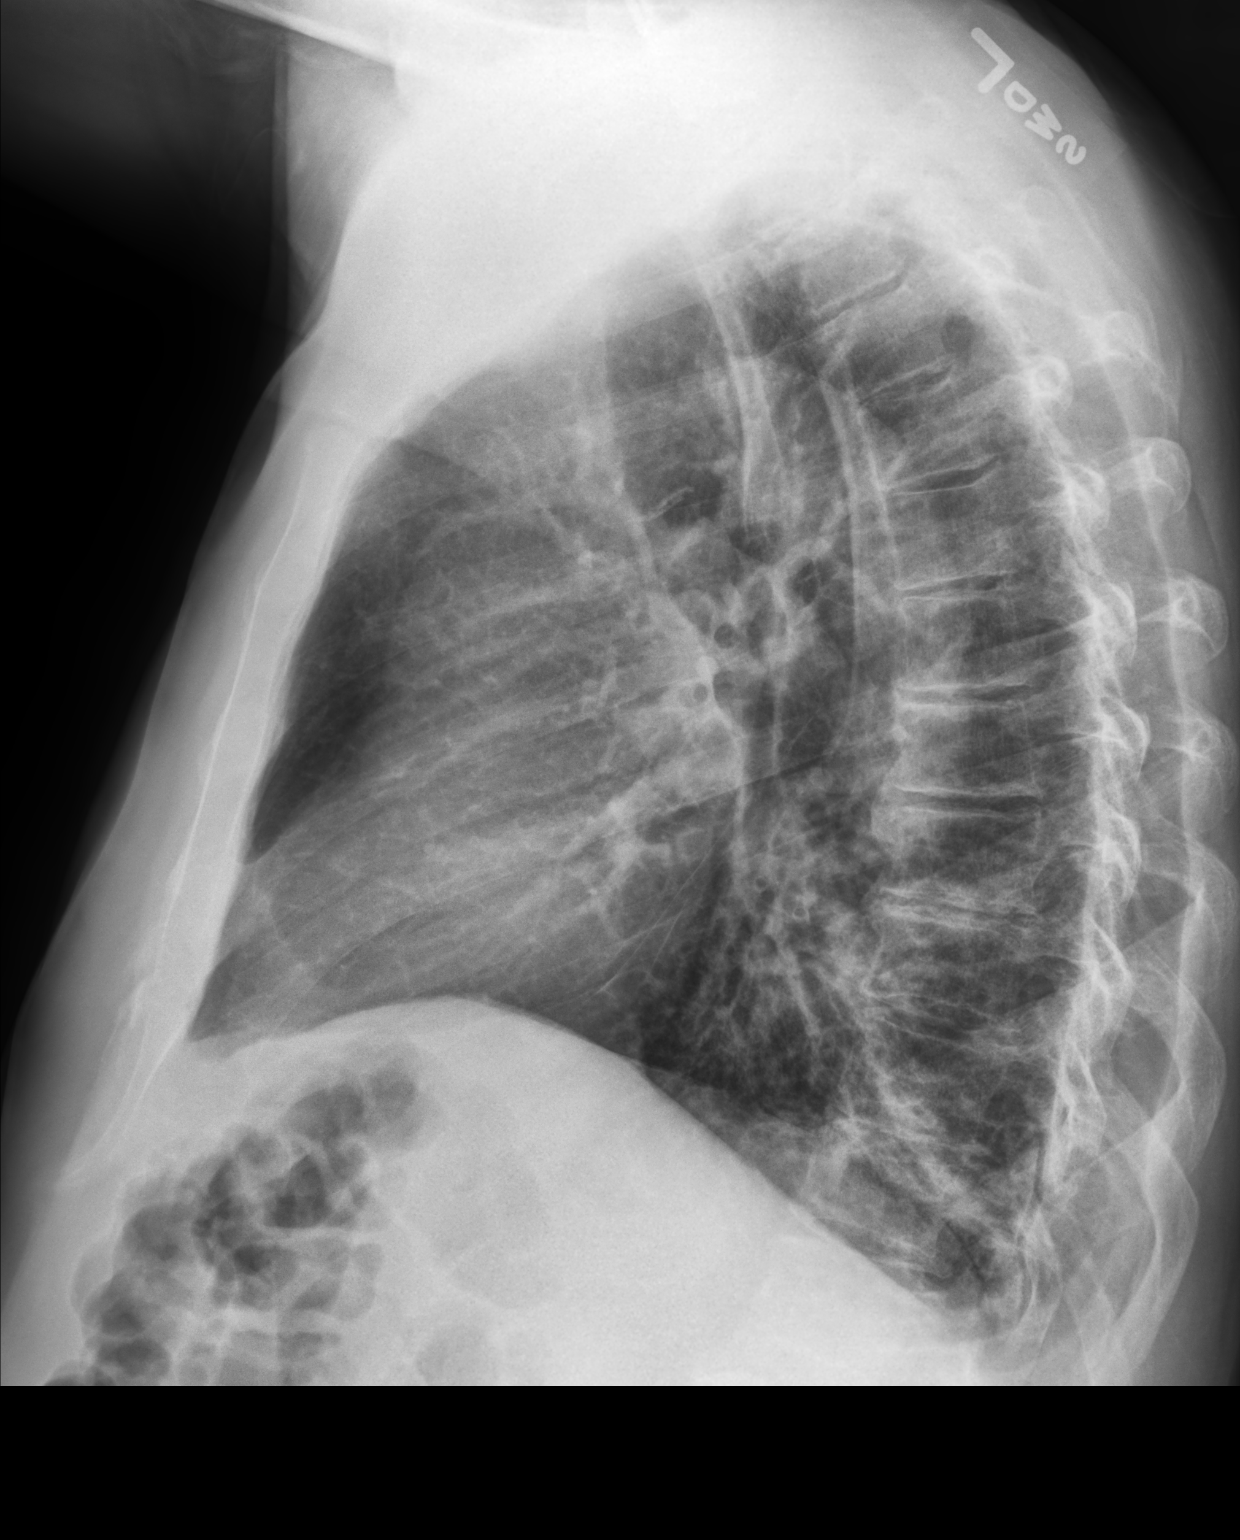

[2 of 2 positions shown; findings below may reference images not displayed]

FINDINGS: Mild diffuse chronic appearing increased interstitial lung markings
are seen. Very mild atelectasis and/or early infiltrate is seen
within the retrocardiac region of the left lung base. There is no
evidence of a pleural effusion or pneumothorax. The heart size and
mediastinal contours are within normal limits. A radiopaque fusion
plate and screws are seen overlying the lower cervical spine. The
visualized skeletal structures are otherwise unremarkable.
IMPRESSION: Chronic interstitial lung disease with very mild left basilar
atelectasis and/or early infiltrate.

## 2022-09-21 IMAGING — CR DG CHEST 2V
2 series · 2 of 2 positions shown · non-contrast
Comparison: 03/07/2020

CLINICAL DATA: Shortness of breath

EXAM:
CHEST - 2 VIEW

[w chest pa]
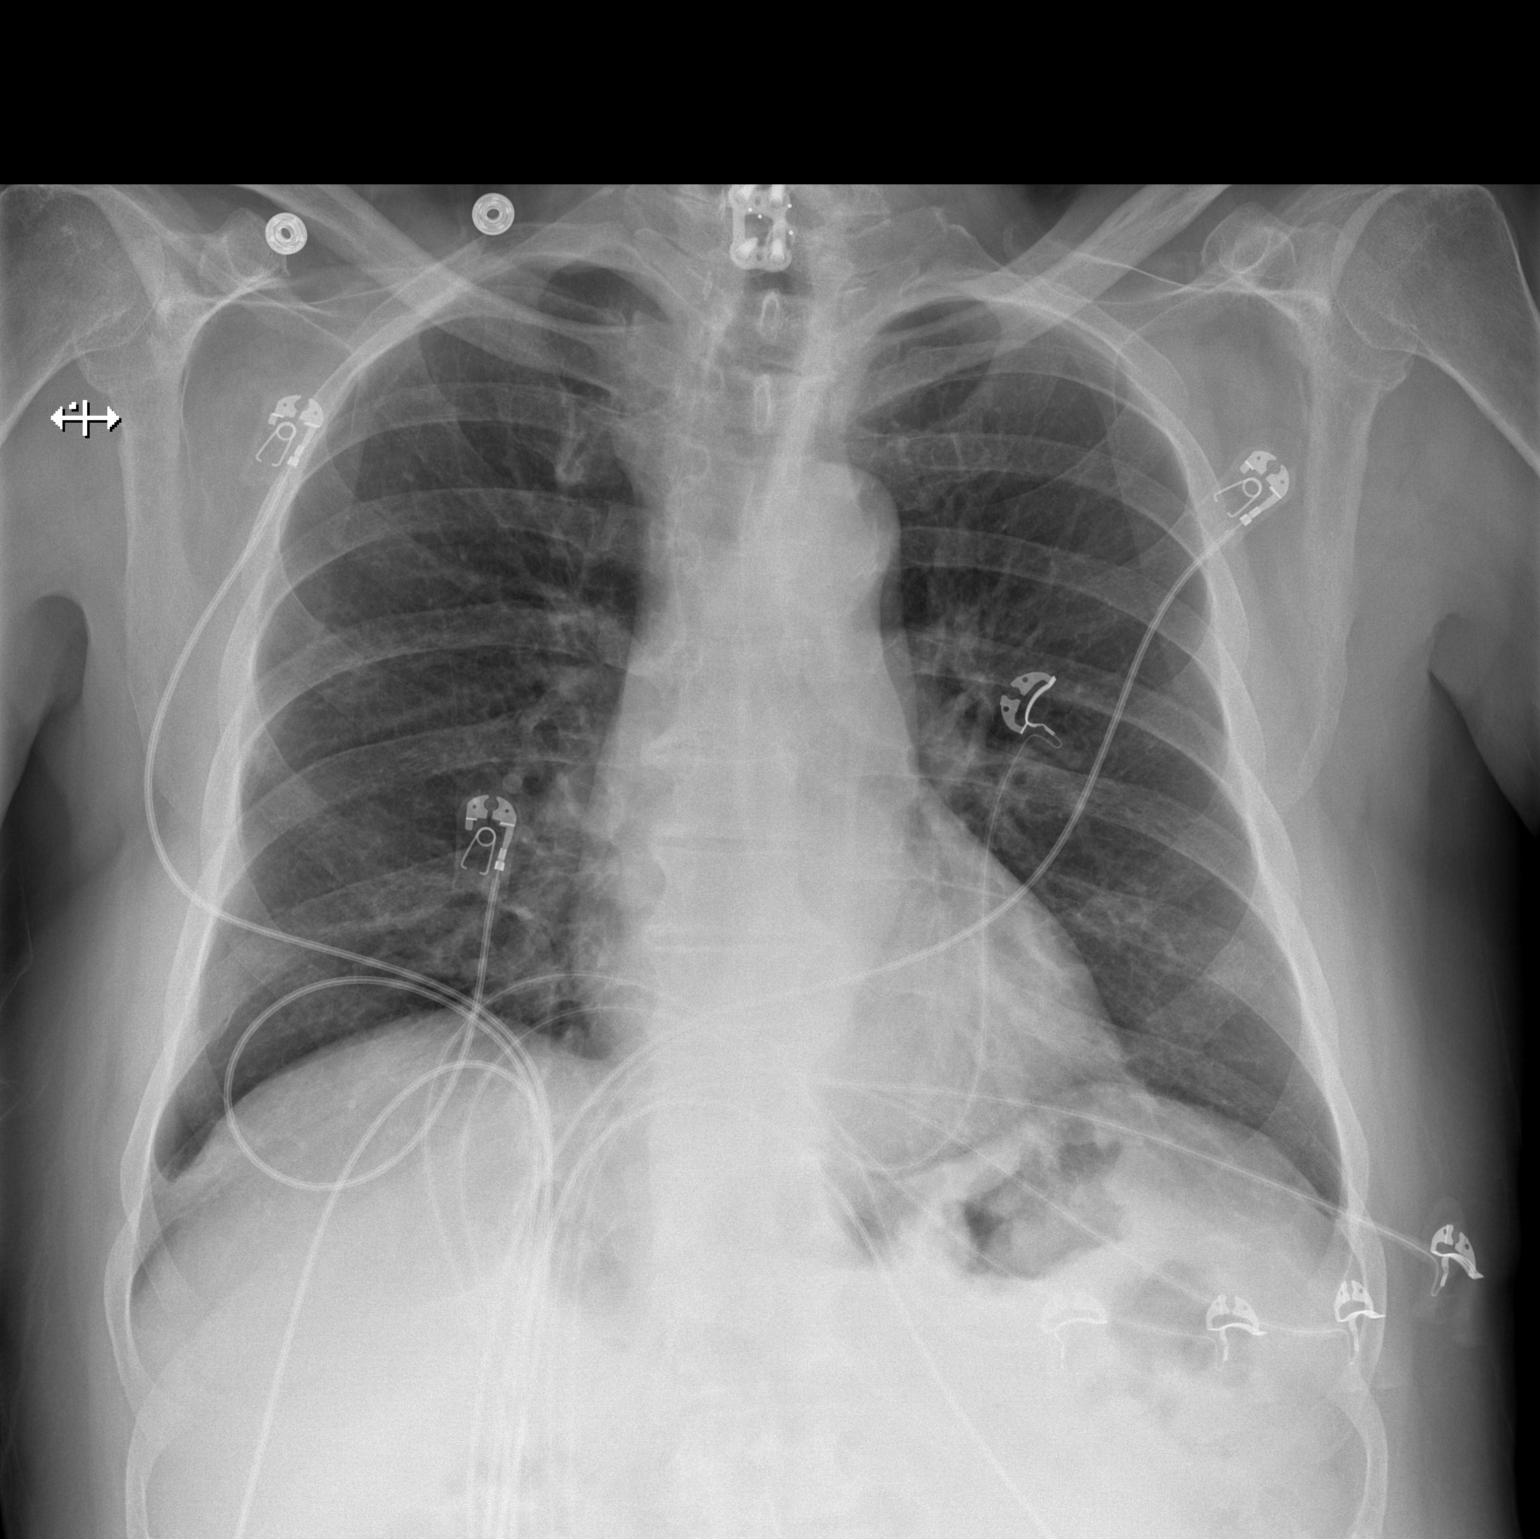

[w chest lat]
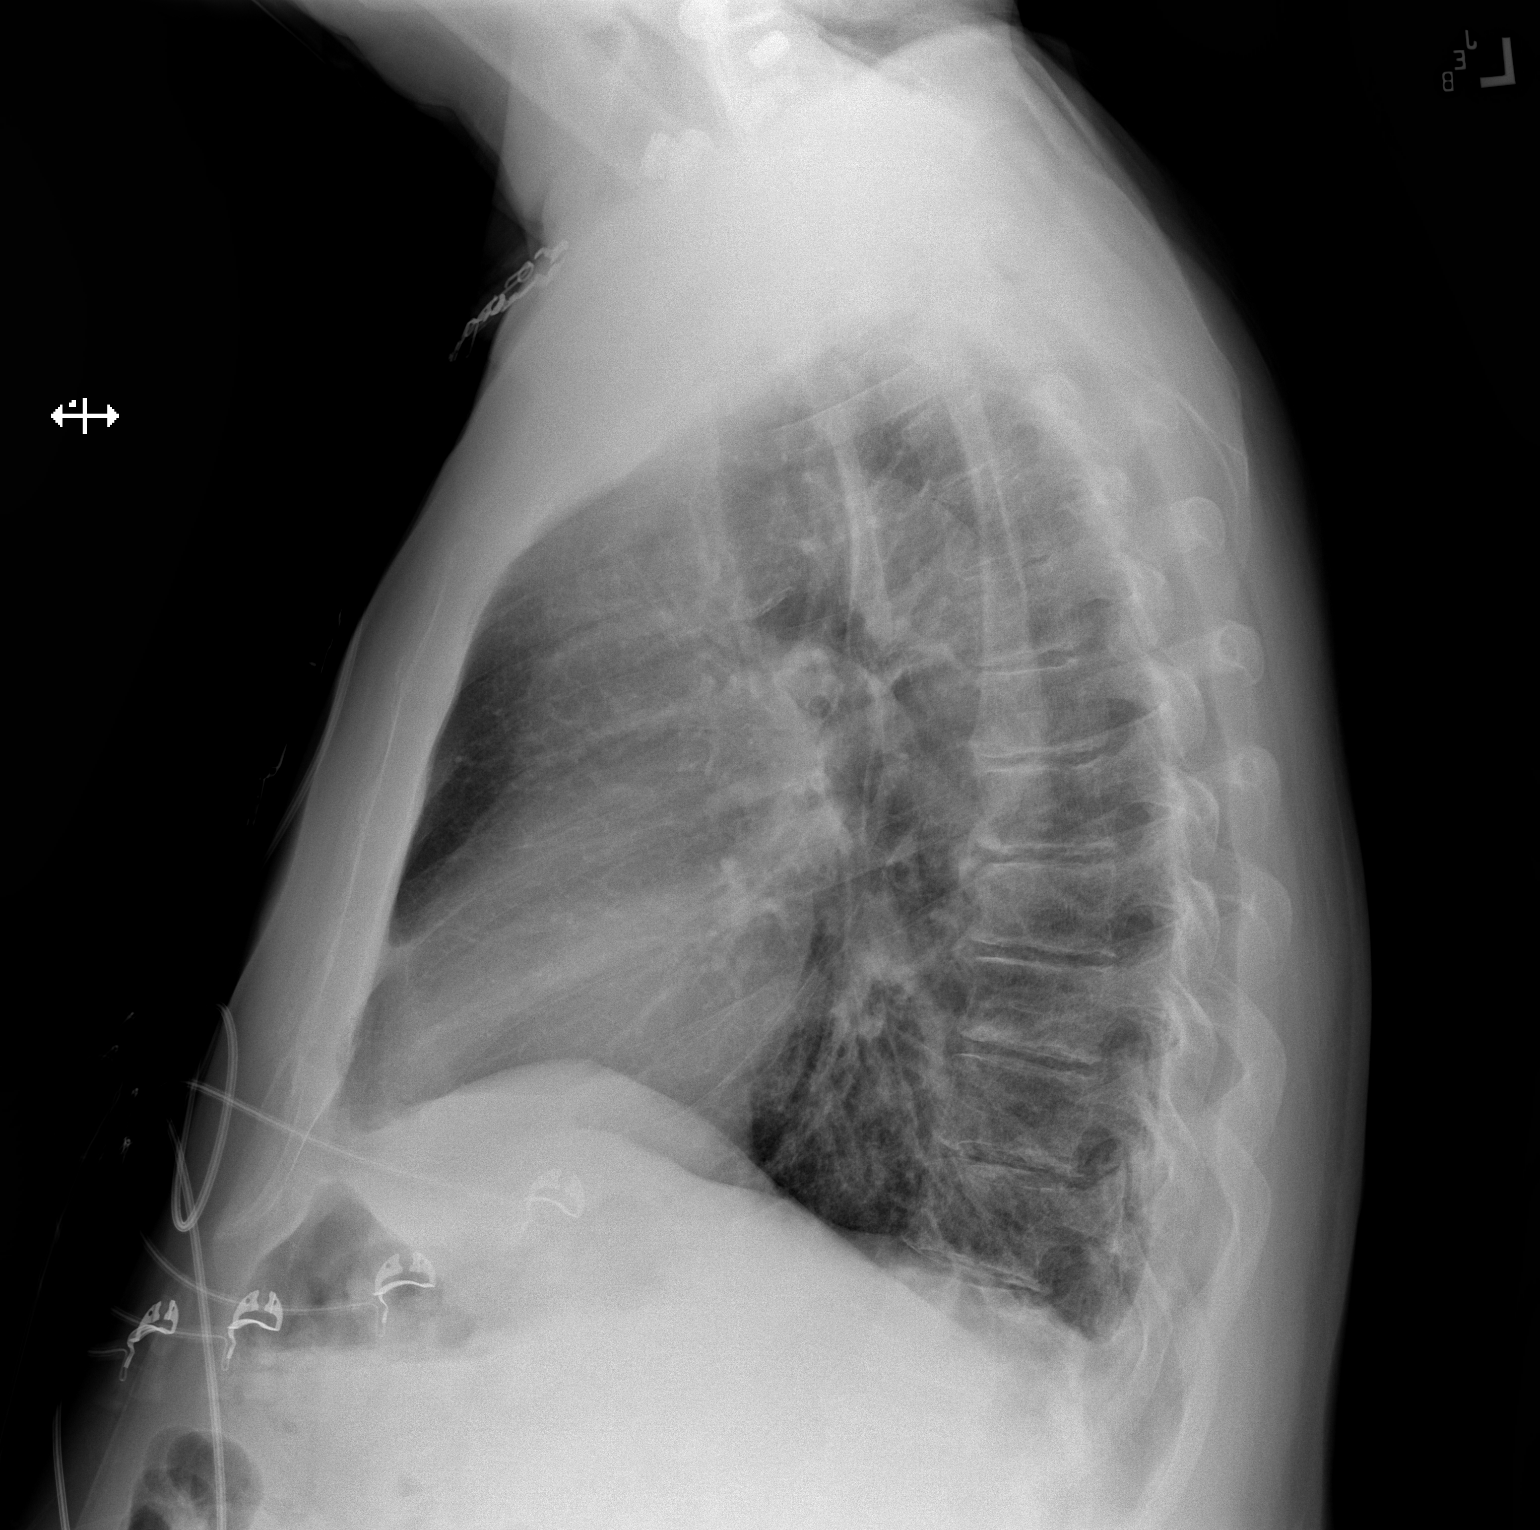

[2 of 2 positions shown; findings below may reference images not displayed]

FINDINGS: The cardiac silhouette, mediastinal and hilar contours are normal.
There is mild tortuosity and calcification of the thoracic aorta.

The lungs are clear of an acute process. No pleural effusions or
pulmonary lesions. The bony thorax is intact. Anterior and interbody
cervical fusion hardware noted.
IMPRESSION: No acute cardiopulmonary findings.

## 2022-09-22 ENCOUNTER — Encounter: Payer: Self-pay | Admitting: Physician Assistant

## 2022-09-24 MED ORDER — VALACYCLOVIR HCL 500 MG PO TABS: 500.0000 mg | ORAL_TABLET | Freq: Every day | ORAL | 1 refills | Status: DC

## 2022-09-24 NOTE — Telephone Encounter (Signed)
Spoke to pt's wife told her Rx for pt is ready for pick up will be at the front desk. She will let him know.

## 2022-10-05 ENCOUNTER — Ambulatory Visit (INDEPENDENT_AMBULATORY_CARE_PROVIDER_SITE_OTHER): Payer: Medicare Other | Admitting: Physician Assistant

## 2022-10-05 ENCOUNTER — Encounter: Payer: Self-pay | Admitting: Physician Assistant

## 2022-10-05 VITALS — BP 110/69 | HR 62 | Temp 97.8°F | Ht 68.0 in | Wt 205.2 lb

## 2022-10-05 DIAGNOSIS — R051 Acute cough: Secondary | ICD-10-CM

## 2022-10-05 DIAGNOSIS — F339 Major depressive disorder, recurrent, unspecified: Secondary | ICD-10-CM

## 2022-10-05 DIAGNOSIS — G4700 Insomnia, unspecified: Secondary | ICD-10-CM

## 2022-10-05 MED ORDER — TRAZODONE HCL 50 MG PO TABS: 50.0000 mg | ORAL_TABLET | Freq: Every evening | ORAL | 1 refills | Status: DC | PRN

## 2022-10-05 NOTE — Patient Instructions (Addendum)
It was great to see you!  Restart your Flonase -- keep me posted on your cough with this  Start 50 mg trazodone nightly After 5-7 days, may increase to 100 mg nightly Take 30 minutes before bed  If sleep improves but still no improvement of your mood, message me  If you develop suicidal thoughts, please tell someone and immediately proceed to our local 24/7 crisis center, [facility]. [address], [place], [place] [address] [phone].   Take care,  Jarold Motto PA-C

## 2022-10-05 NOTE — Progress Notes (Signed)
Taylor Berger is a 77 y.o. male here for a new problem.  History of Present Illness:   Chief Complaint  Patient presents with   trouble sleeping   Depression   Cough    Productive, x 2 month  SHOB     HPI  Insomnia/Depression Patient complains of insomnia. He states that he can't go to sleep because of his depression. He was previously on zoloft a couple of years ago which he felt was not so effective. Patient expresses that the depression is situational and concerns politics. He states that he thinks things will improve after the 2024 election. Patient mentions that he sleeps during the day and therefore has trouble sleeping at night. He no longer tries to go to bed at a decent time because he has given up on trying to fix his sleep cycle. Denies concerns for bipolar/manic disorder and significant screen time on phone.   Cough Patient is complaining of a lingering cough. He states that he uses Zyrtec and has allergies. Denies fever, chills, and night sweats.  Past Medical History:  Diagnosis Date   Allergy    Seasonal   Aortic aneurysm (HCC)    Depression    DVT (deep venous thrombosis) (HCC) 2020   GERD (gastroesophageal reflux disease)    Goiter    Hyperlipidemia    Hypertension    Lymphoma (HCC) 2020   Follicular, stomach   Neuropathy of both feet      Social History   Tobacco Use   Smoking status: Never   Smokeless tobacco: Never  Vaping Use   Vaping Use: Never used  Substance Use Topics   Alcohol use: Not Currently   Drug use: Never    Past Surgical History:  Procedure Laterality Date   CARPAL TUNNEL RELEASE Left    CERVICAL DISCECTOMY  1995   C-7   CERVICAL FUSION  01/1989   C-3 through C-6 / no metal   COLONOSCOPY     ELBOW SURGERY Left    FOOT SURGERY Left 1999   Plantar fascitis   low back fusion  2013   Lumbar & Low Thoracic with metal   SHOULDER ARTHROSCOPY WITH ROTATOR CUFF REPAIR Right 2014   UPPER GASTROINTESTINAL ENDOSCOPY      Family  History  Problem Relation Age of Onset   Diabetes Mother    Hypertension Mother    Hyperlipidemia Mother    Heart failure Mother    Leukemia Father    Stroke Father    Esophageal cancer Neg Hx    Colon polyps Neg Hx    Pancreatic cancer Neg Hx    Stomach cancer Neg Hx    Rectal cancer Neg Hx     No Known Allergies  Current Medications:   Current Outpatient Medications:    amLODipine (NORVASC) 10 MG tablet, Take 1 tablet by mouth once daily, Disp: 90 tablet, Rfl: 0   atorvastatin (LIPITOR) 80 MG tablet, Take 1 tablet (80 mg total) by mouth daily., Disp: 90 tablet, Rfl: 3   cetirizine (ZYRTEC) 10 MG tablet, Take 10 mg by mouth daily., Disp: , Rfl:    clindamycin (CLEOCIN-T) 1 % external solution, Apply topically 2 (two) times daily as needed., Disp: 30 mL, Rfl: 1   esomeprazole (NEXIUM) 20 MG capsule, Take 20 mg by mouth daily at 12 noon., Disp: , Rfl:    FIBER ADULT GUMMIES PO, Take 2 each by mouth daily in the afternoon., Disp: , Rfl:    fluticasone (FLONASE) 50  MCG/ACT nasal spray, Place into both nostrils as needed for allergies or rhinitis., Disp: , Rfl:    lisinopril (ZESTRIL) 5 MG tablet, Take 1 tablet by mouth once daily, Disp: 90 tablet, Rfl: 0   Probiotic Product (PROBIOTIC GUMMIES PO), Take 3 each by mouth daily in the afternoon., Disp: , Rfl:    rivaroxaban (XARELTO) 10 MG TABS tablet, Take 1 tablet (10 mg total) by mouth daily., Disp: 90 tablet, Rfl: 2   valACYclovir (VALTREX) 500 MG tablet, Take 1 tablet (500 mg total) by mouth daily., Disp: 90 tablet, Rfl: 1   Review of Systems:   ROS  Vitals:   Vitals:   10/05/22 1030  BP: 110/69  Pulse: 62  Temp: 97.8 F (36.6 C)  TempSrc: Temporal  SpO2: 93%  Weight: 205 lb 3.2 oz (93.1 kg)  Height: 5\' 8"  (1.727 m)     Body mass index is 31.2 kg/m.  Physical Exam:   Physical Exam Constitutional:      General: He is not in acute distress.    Appearance: Normal appearance. He is not ill-appearing.  HENT:      Head: Normocephalic and atraumatic.     Right Ear: External ear normal.     Left Ear: External ear normal.  Eyes:     Extraocular Movements: Extraocular movements intact.     Pupils: Pupils are equal, round, and reactive to light.  Cardiovascular:     Rate and Rhythm: Normal rate and regular rhythm.     Heart sounds: Normal heart sounds. No murmur heard.    No gallop.  Pulmonary:     Effort: Pulmonary effort is normal. No respiratory distress.     Breath sounds: Normal breath sounds. No wheezing or rales.  Skin:    General: Skin is warm and dry.  Neurological:     Mental Status: He is alert and oriented to person, place, and time.  Psychiatric:        Judgment: Judgment normal.     Assessment and Plan:   Acute cough No red flags on exam.  Recommend Flonase twice daily and continue zyrtec. Discussed taking medications as prescribed. Reviewed return precautions including worsening fever, SOB, worsening cough or other concerns. Push fluids and rest. I recommend that patient follow-up if symptoms worsen or persist despite treatment x 7-10 days, sooner if needed.  Insomnia, unspecified type; Depression, recurrent No red flags Denies SI/HI Start trazodone 50 mg nightly, with permission to increase to 100 mg nightly after one week If still no improvement of sleep or mood, reach out He is amenable to restarting an SSRI such as Zoloft if needed I discussed with patient that if they develop any SI, to tell someone immediately and seek medical attention.   I,Verona Buck,acting as a Neurosurgeon for [employer], PA.,have documented all relevant documentation on the behalf of Jarold Motto, PA,as directed by  Jarold Motto, PA while in the presence of Jarold Motto, [place].  I, Jarold Motto, [place], have reviewed all documentation for this visit. The documentation on 10/05/22 for the exam, diagnosis, procedures, and orders are all accurate and complete.  Jarold Motto, PA-C

## 2022-10-06 ENCOUNTER — Encounter: Payer: Self-pay | Admitting: Physician Assistant

## 2022-10-08 NOTE — Telephone Encounter (Signed)
Noted  

## 2022-10-14 ENCOUNTER — Other Ambulatory Visit: Payer: Self-pay | Admitting: Physician Assistant

## 2022-10-21 ENCOUNTER — Other Ambulatory Visit: Payer: Self-pay | Admitting: Physician Assistant

## 2022-10-21 DIAGNOSIS — I10 Essential (primary) hypertension: Secondary | ICD-10-CM

## 2022-11-05 ENCOUNTER — Ambulatory Visit (INDEPENDENT_AMBULATORY_CARE_PROVIDER_SITE_OTHER): Payer: Medicare Other | Admitting: Gastroenterology

## 2022-11-05 ENCOUNTER — Encounter: Payer: Self-pay | Admitting: Gastroenterology

## 2022-11-05 VITALS — BP 118/70 | HR 77 | Ht 68.0 in | Wt 207.0 lb

## 2022-11-05 DIAGNOSIS — Z8719 Personal history of other diseases of the digestive system: Secondary | ICD-10-CM | POA: Diagnosis not present

## 2022-11-05 DIAGNOSIS — K5900 Constipation, unspecified: Secondary | ICD-10-CM

## 2022-11-05 DIAGNOSIS — Z86010 Personal history of colonic polyps: Secondary | ICD-10-CM

## 2022-11-05 DIAGNOSIS — K219 Gastro-esophageal reflux disease without esophagitis: Secondary | ICD-10-CM | POA: Diagnosis not present

## 2022-11-05 NOTE — Progress Notes (Signed)
HPI : Taylor Berger is a 77 y.o. male with a history of lower extremity DVT, follicular lymphoma under surveillance, GERD, hypertension and seasonal allergies, previously followed by Dr. Orvan Falconer who presents today to discuss irregular bowel habits and bloating. He was last seen in our office in October 2022 by Hyacinth Meeker.  At that time, he was feeling well with no complaints.  His GERD symptoms were well-controlled on twice daily Nexium, and he was recommended to continue twice daily dosing for 12 weeks total, then reduce to once daily.  He reports that his GERD symptoms continue to be well-controlled with once daily Nexium.  He has tried stopping Nexium, but his symptoms returned when he does not take it for a few days.  He has been having infrequent bowel movements and abdominal bloating for a few months now.  He feels full a lot of the time, but denies any significant abdominal pain.  He has a bowel movement about every other day and his stools are often small and hard.  He takes a stool softener daily (does not know the name) which she has been taking for years.  He had previously been recommended to take MiraLAX for his constipation, but states that this caused urgency and loose stools, so he stopped. He denies any blood in the stool.  His weight has been stable.  Has been taking allergy medicine twice a day as well as nasal spray due to worsening allergy symptoms in the summer months.  He also takes Norvasc for his high blood pressure.  He denies any recent dietary changes or recent antibiotics.  He takes Valtrex daily which she has been doing for a long time.  He was started on trazodone a few weeks ago due to difficulty sleeping for the past few months.  He has follicular lymphoma, localized to the mesentery.  He has not required treatment.  He undergoes regular surveillance with Dr. Leonides Schanz.     11/17/2020 colonoscopy with hemorrhoids, nonbleeding external and internal, diverticulosis  in the sigmoid and descending colon, 1 3 mm polyp in the descending colon, 2 to-3 mm polyps in the ascending colon otherwise normal.  Polyps showed tubular adenomas and hyperplastic.  Repeat surveillance was recommended in 7 years if you still focusing on preventative health measures at that time.     11/17/2020 EGD with no endoscopic esophageal abnormality to explain patient's dysphagia, esophagus was empirically dilated and gastritis.  Pathology showed gastritis and benign fundic gland polyps with reflux esophagitis.  There was no evidence of Barrett's.  Was recommended that he increase his Nexium to 40 mg twice daily for 12 weeks, then go back to once daily.   Past Medical History:  Diagnosis Date   Allergy    Seasonal   Aortic aneurysm (HCC)    Depression    DVT (deep venous thrombosis) (HCC) 2020   GERD (gastroesophageal reflux disease)    Goiter    Hyperlipidemia    Hypertension    Lymphoma (HCC) 2020   Follicular, stomach   Neuropathy of both feet      Past Surgical History:  Procedure Laterality Date   CARPAL TUNNEL RELEASE Left    CERVICAL DISCECTOMY  1995   C-7   CERVICAL FUSION  01/1989   C-3 through C-6 / no metal   COLONOSCOPY     ELBOW SURGERY Left    FOOT SURGERY Left 1999   Plantar fascitis   low back fusion  2013   Lumbar & Low  Thoracic with metal   SHOULDER ARTHROSCOPY WITH ROTATOR CUFF REPAIR Right 2014   UPPER GASTROINTESTINAL ENDOSCOPY     Family History  Problem Relation Age of Onset   Diabetes Mother    Hypertension Mother    Hyperlipidemia Mother    Heart failure Mother    Leukemia Father    Stroke Father    Esophageal cancer Neg Hx    Colon polyps Neg Hx    Pancreatic cancer Neg Hx    Stomach cancer Neg Hx    Rectal cancer Neg Hx    Social History   Tobacco Use   Smoking status: Never   Smokeless tobacco: Never  Vaping Use   Vaping status: Never Used  Substance Use Topics   Alcohol use: Not Currently   Drug use: Never   Current  Outpatient Medications  Medication Sig Dispense Refill   amLODipine (NORVASC) 10 MG tablet Take 1 tablet by mouth once daily 90 tablet 0   atorvastatin (LIPITOR) 80 MG tablet Take 1 tablet (80 mg total) by mouth daily. 90 tablet 3   cetirizine (ZYRTEC) 10 MG tablet Take 10 mg by mouth daily.     clindamycin (CLEOCIN-T) 1 % external solution Apply topically 2 (two) times daily as needed. 30 mL 1   esomeprazole (NEXIUM) 20 MG capsule Take 20 mg by mouth daily at 12 noon.     FIBER ADULT GUMMIES PO Take 2 each by mouth daily in the afternoon.     fluticasone (FLONASE) 50 MCG/ACT nasal spray Place into both nostrils as needed for allergies or rhinitis.     lisinopril (ZESTRIL) 5 MG tablet Take 1 tablet by mouth once daily 90 tablet 0   Probiotic Product (PROBIOTIC GUMMIES PO) Take 3 each by mouth daily in the afternoon.     rivaroxaban (XARELTO) 10 MG TABS tablet Take 1 tablet (10 mg total) by mouth daily. 90 tablet 2   traZODone (DESYREL) 50 MG tablet Take 1 tablet (50 mg total) by mouth at bedtime as needed for sleep. 30 tablet 1   valACYclovir (VALTREX) 500 MG tablet Take 1 tablet (500 mg total) by mouth daily. 90 tablet 1   No current facility-administered medications for this visit.   No Known Allergies   Review of Systems: All systems reviewed and negative except where noted in HPI.    No results found.  Physical Exam: BP 118/70   Pulse 77   Ht 5\' 8"  (1.727 m)   Wt 207 lb (93.9 kg)   BMI 31.47 kg/m  Constitutional: Pleasant,well-developed, Caucasian male in no acute distress. HEENT: Normocephalic and atraumatic. Conjunctivae are normal. No scleral icterus. Neck supple.  Cardiovascular: Normal rate, regular rhythm.  Pulmonary/chest: Effort normal and breath sounds normal. No wheezing, rales or rhonchi. Abdominal: Soft, nondistended, nontender. Bowel sounds active throughout. There are no masses palpable. No hepatomegaly. Extremities: no edema Neurological: Alert and oriented  to person place and time. Skin: Skin is warm and dry. No rashes noted. Psychiatric: Normal mood and affect. Behavior is normal.  CBC    Component Value Date/Time   WBC 8.4 06/13/2022 0828   WBC 6.6 05/23/2022 1004   RBC 4.36 06/13/2022 0828   HGB 14.4 06/13/2022 0828   HCT 41.8 06/13/2022 0828   PLT 210 06/13/2022 0828   MCV 95.9 06/13/2022 0828   MCH 33.0 06/13/2022 0828   MCHC 34.4 06/13/2022 0828   RDW 13.0 06/13/2022 0828   LYMPHSABS 2.1 06/13/2022 0828   MONOABS 0.9 06/13/2022 0828  EOSABS 0.4 06/13/2022 0828   BASOSABS 0.1 06/13/2022 0828    CMP     Component Value Date/Time   NA 141 06/13/2022 0828   NA 144 02/24/2020 1521   K 3.4 (L) 06/13/2022 0828   CL 107 06/13/2022 0828   CO2 29 06/13/2022 0828   GLUCOSE 107 (H) 06/13/2022 0828   BUN 23 06/13/2022 0828   BUN 16 02/24/2020 1521   CREATININE 0.96 06/13/2022 0828   CALCIUM 10.0 06/13/2022 0828   PROT 7.0 06/13/2022 0828   ALBUMIN 4.2 06/13/2022 0828   AST 23 06/13/2022 0828   ALT 32 06/13/2022 0828   ALKPHOS 74 06/13/2022 0828   BILITOT 1.0 06/13/2022 0828   GFRNONAA >60 06/13/2022 0828   GFRAA 98 02/24/2020 1521       Latest Ref Rng & Units 06/13/2022    8:28 AM 05/23/2022   10:04 AM 12/07/2021    9:34 AM  CBC EXTENDED  WBC 4.0 - 10.5 K/uL 8.4  6.6  7.0   RBC 4.22 - 5.81 MIL/uL 4.36  4.43  4.54   Hemoglobin 13.0 - 17.0 g/dL 10.2  72.5  36.6   HCT 39.0 - 52.0 % 41.8  42.5  42.9   Platelets 150 - 400 K/uL 210  215.0  222   NEUT# 1.7 - 7.7 K/uL 4.9  3.8  4.3   Lymph# 0.7 - 4.0 K/uL 2.1  1.8  1.5       ASSESSMENT AND PLAN: 77 year old male with recent worsening of constipation and bloating, currently taking stool softeners and fiber Gummies daily (MiraLAX caused diarrhea/urgency.  He is taking Norvasc and twice daily antihistamines for his allergies which are likely contributing to his constipation. I recommended that he increase his dietary fiber, consider increasing fiber Gummies and then try  taking senna as needed for when he has not had a good bowel movement in 2 or 3 days.   Constipation - Increase dietary fiber - Increase fiber gummies - Senna 2 tabs prn q2-3 days  Colon cancer screening/history of colon polyps -Consider repeat colonoscopy in 2029 if still healthy  History of Barrett's/GERD -Recommend against any further Barrett's surveillance given absence of Barrett's on his last EGD - Okay to continue low-dose PPI  Jarold Motto, [place]

## 2022-11-05 NOTE — Patient Instructions (Addendum)
_______________________________________________________  If your blood pressure at your visit was 140/90 or greater, please contact your primary care physician to follow up on this.  _______________________________________________________  If you are age 79 or older, your body mass index should be between 23-30. Your Body mass index is 31.47 kg/m. If this is out of the aforementioned range listed, please consider follow up with your Primary Care Provider.  If you are age 38 or younger, your body mass index should be between 19-25. Your Body mass index is 31.47 kg/m. If this is out of the aformentioned range listed, please consider follow up with your Primary Care Provider.   Increase you fiber intake. Take fiber gummies and increase fiber in your diet.  Take Senna 8.6 mg , take two tablets as needed every 2-3 days.   The [facility] providers would like to encourage you to use [facility] to communicate with providers for non-urgent requests or questions.  Due to long hold times on the telephone, sending your provider a message by [facility] may be a faster and more efficient way to get a response.  Please allow 48 business hours for a response.  Please remember that this is for non-urgent requests.   It was a pleasure to see you today!  Thank you for trusting me with your gastrointestinal care!    Scott E.Tomasa Rand, MD

## 2022-11-06 ENCOUNTER — Ambulatory Visit: Payer: Medicare Other | Admitting: Family Medicine

## 2022-11-24 ENCOUNTER — Other Ambulatory Visit: Payer: Self-pay | Admitting: Physician Assistant

## 2022-11-27 ENCOUNTER — Ambulatory Visit: Payer: Medicare Other | Admitting: Physician Assistant

## 2022-11-27 ENCOUNTER — Telehealth: Payer: Self-pay | Admitting: Physician Assistant

## 2022-11-27 ENCOUNTER — Emergency Department (HOSPITAL_COMMUNITY): Payer: Medicare Other

## 2022-11-27 ENCOUNTER — Other Ambulatory Visit: Payer: Self-pay

## 2022-11-27 ENCOUNTER — Emergency Department (HOSPITAL_BASED_OUTPATIENT_CLINIC_OR_DEPARTMENT_OTHER): Payer: Medicare Other

## 2022-11-27 ENCOUNTER — Emergency Department (HOSPITAL_BASED_OUTPATIENT_CLINIC_OR_DEPARTMENT_OTHER)
Admission: EM | Admit: 2022-11-27 | Discharge: 2022-11-27 | Disposition: A | Discharge status: Home / Self Care | Payer: Medicare Other | Attending: Emergency Medicine | Admitting: Emergency Medicine

## 2022-11-27 ENCOUNTER — Encounter (HOSPITAL_BASED_OUTPATIENT_CLINIC_OR_DEPARTMENT_OTHER): Payer: Self-pay

## 2022-11-27 DIAGNOSIS — I10 Essential (primary) hypertension: Secondary | ICD-10-CM | POA: Insufficient documentation

## 2022-11-27 DIAGNOSIS — Z7901 Long term (current) use of anticoagulants: Secondary | ICD-10-CM | POA: Insufficient documentation

## 2022-11-27 DIAGNOSIS — Z79899 Other long term (current) drug therapy: Secondary | ICD-10-CM | POA: Diagnosis not present

## 2022-11-27 DIAGNOSIS — Z86718 Personal history of other venous thrombosis and embolism: Secondary | ICD-10-CM | POA: Diagnosis not present

## 2022-11-27 DIAGNOSIS — H532 Diplopia: Secondary | ICD-10-CM | POA: Diagnosis present

## 2022-11-27 LAB — COMPREHENSIVE METABOLIC PANEL
ALT: 19 U/L (ref 0–44)
AST: 18 U/L (ref 15–41)
Albumin: 4.3 g/dL (ref 3.5–5.0)
Alkaline Phosphatase: 59 U/L (ref 38–126)
Anion gap: 8 (ref 5–15)
BUN: 17 mg/dL (ref 8–23)
CO2: 24 mmol/L (ref 22–32)
Calcium: 10 mg/dL (ref 8.9–10.3)
Chloride: 108 mmol/L (ref 98–111)
Creatinine, Ser: 0.97 mg/dL (ref 0.61–1.24)
GFR, Estimated: >60 mL/min (ref >60)
Glucose, Bld: 145 mg/dL — ABNORMAL HIGH (ref 70–99)
Potassium: 3.9 mmol/L (ref 3.5–5.1)
Sodium: 140 mmol/L (ref 135–145)
Total Bilirubin: 1.1 mg/dL (ref 0.3–1.2)
Total Protein: 6.9 g/dL (ref 6.5–8.1)

## 2022-11-27 LAB — CBC
HCT: 42.5 % (ref 39.0–52.0)
Hemoglobin: 14.6 g/dL (ref 13.0–17.0)
MCH: 33.6 pg (ref 26.0–34.0)
MCHC: 34.4 g/dL (ref 30.0–36.0)
MCV: 97.7 fL (ref 80.0–100.0)
Platelets: 204 10*3/uL (ref 150–400)
RBC: 4.35 MIL/uL (ref 4.22–5.81)
RDW: 12.6 % (ref 11.5–15.5)
WBC: 7.5 10*3/uL (ref 4.0–10.5)
nRBC: 0 % (ref 0.0–0.2)

## 2022-11-27 LAB — DIFFERENTIAL
Abs Immature Granulocytes: 0.06 10*3/uL (ref 0.00–0.07)
Basophils Absolute: 0.1 10*3/uL (ref 0.0–0.1)
Basophils Relative: 1 %
Eosinophils Absolute: 0.2 10*3/uL (ref 0.0–0.5)
Eosinophils Relative: 3 %
Immature Granulocytes: 1 %
Lymphocytes Relative: 20 %
Lymphs Abs: 1.5 10*3/uL (ref 0.7–4.0)
Monocytes Absolute: 0.6 10*3/uL (ref 0.1–1.0)
Monocytes Relative: 8 %
Neutro Abs: 5 10*3/uL (ref 1.7–7.7)
Neutrophils Relative %: 67 %

## 2022-11-27 LAB — ETHANOL: Alcohol, Ethyl (B): <10 mg/dL (ref <10)

## 2022-11-27 MED ORDER — IOHEXOL 350 MG/ML SOLN
100.0000 mL | Freq: Once | INTRAVENOUS | Status: AC | PRN
  Administered 2022-11-27: 80 mL via INTRAVENOUS

## 2022-11-27 NOTE — ED Notes (Signed)
Patient is on cardiac monitor, with call bell with in reach.

## 2022-11-27 NOTE — ED Notes (Signed)
Provider informed of the [facility].Marland KitchenMarland Kitchen

## 2022-11-27 NOTE — ED Provider Notes (Signed)
  Physical Exam  BP 105/72   Pulse (!) 56   Temp 98.4 F (36.9 C) (Oral)   Resp 18   Ht 5\' 8"  (1.727 m)   Wt 93 kg   SpO2 92%   BMI 31.17 kg/m   Physical Exam Vitals and nursing note reviewed.  Constitutional:      General: He is not in acute distress.    Appearance: He is well-developed.  HENT:     Head: Normocephalic and atraumatic.  Eyes:     Conjunctiva/sclera: Conjunctivae normal.  Cardiovascular:     Rate and Rhythm: Normal rate and regular rhythm.     Heart sounds: No murmur heard. Pulmonary:     Effort: Pulmonary effort is normal. No respiratory distress.     Breath sounds: Normal breath sounds.  Abdominal:     Palpations: Abdomen is soft.     Tenderness: There is no abdominal tenderness.  Musculoskeletal:        General: No swelling.     Cervical back: Neck supple.  Skin:    General: Skin is warm and dry.     Capillary Refill: Capillary refill takes less than 2 seconds.  Neurological:     General: No focal deficit present.     Mental Status: He is alert and oriented to person, place, and time. Mental status is at baseline.     Cranial Nerves: No cranial nerve deficit.     Sensory: No sensory deficit.     Motor: No weakness.  Psychiatric:        Mood and Affect: Mood normal.     Procedures  Procedures  ED Course / MDM    Medical Decision Making Amount and/or Complexity of Data Reviewed Radiology: ordered.   Patient is evaluated by provider at facility ED.  Patient presented with episode of diplopia which is since resolved.  Currently is asymptomatic, no visual field cuts or visual deficits.  Normal neurologic exam.  Seen here for MRI.  MRI here without signs of acute stroke or other abnormality.  Per prior plan will discharge home and have him follow-up with PCP.  He is comfortable this plan.  Return precautions given.       Laurence Spates, MD 11/28/22 Marlyne Beards

## 2022-11-27 NOTE — ED Notes (Signed)
Report given to the Charge [facility].Marland KitchenMarland Kitchen

## 2022-11-27 NOTE — ED Provider Notes (Signed)
[place] EMERGENCY DEPARTMENT AT [facility] Provider Note   CSN: [id number] Arrival date & time: 11/27/22  1428     History  Chief Complaint  Patient presents with   Diplopia    Taylor Berger is a 77 y.o. male presented to ED with complaint of double vision.  Patient reports around noon today he was watching television and noted that he was seeing double.  He denies any pain in his eye.  He is not certain which I may have been affected, when he isolated each eye he said the vision looked okay but with his eyes open together it was double.  He denies any persistent headache, difficulty with speech, numbness or weakness of the arms or legs.  He denies any history of TIA or stroke.  He reports he does take blood pressure medication, also a statin for high cholesterol.  He says he is prediabetic.  He denies smoking history.  He is here with his wife  Patient has a history of recurrent DVTs for which he is on Xarelto  HPI     Home Medications Prior to Admission medications   Medication Sig Start Date End Date Taking? Authorizing Provider  amLODipine (NORVASC) 10 MG tablet Take 1 tablet by mouth once daily 10/15/22   Jarold Motto, PA  atorvastatin (LIPITOR) 80 MG tablet Take 1 tablet by mouth once daily 11/26/22   Jarold Motto, PA  cetirizine (ZYRTEC) 10 MG tablet Take 10 mg by mouth daily.    [provider]  clindamycin (CLEOCIN-T) 1 % external solution Apply topically 2 (two) times daily as needed. 02/26/22   Jarold Motto, PA  esomeprazole (NEXIUM) 20 MG capsule Take 20 mg by mouth daily at 12 noon.    [provider]  FIBER ADULT GUMMIES PO Take 2 each by mouth daily in the afternoon.    [provider]  fluticasone (FLONASE) 50 MCG/ACT nasal spray Place into both nostrils as needed for allergies or rhinitis.    [provider]  lisinopril (ZESTRIL) 5 MG tablet Take 1 tablet by mouth once daily 10/22/22   Jarold Motto, PA   Probiotic Product (PROBIOTIC GUMMIES PO) Take 3 each by mouth daily in the afternoon.    [provider]  rivaroxaban (XARELTO) 10 MG TABS tablet Take 1 tablet (10 mg total) by mouth daily. 05/23/22   Jarold Motto, PA  traZODone (DESYREL) 50 MG tablet Take 1 tablet (50 mg total) by mouth at bedtime as needed for sleep. 10/05/22   Jarold Motto, PA  valACYclovir (VALTREX) 500 MG tablet Take 1 tablet (500 mg total) by mouth daily. 09/24/22   Jarold Motto, PA      Allergies    Patient has no known allergies.    Review of Systems   Review of Systems  Physical Exam Updated Vital Signs BP 105/72   Pulse (!) 56   Temp 98.4 F (36.9 C) (Oral)   Resp 18   Ht 5\' 8"  (1.727 m)   Wt 93 kg   SpO2 92%   BMI 31.17 kg/m  Physical Exam Constitutional:      General: He is not in acute distress. HENT:     Head: Normocephalic and atraumatic.  Eyes:     General:        Right eye: No discharge.        Left eye: No discharge.     Extraocular Movements: Extraocular movements intact.     Conjunctiva/sclera: Conjunctivae normal.  Pupils: Pupils are equal, round, and reactive to light.  Cardiovascular:     Rate and Rhythm: Normal rate and regular rhythm.  Pulmonary:     Effort: Pulmonary effort is normal. No respiratory distress.  Abdominal:     General: There is no distension.     Tenderness: There is no abdominal tenderness.  Skin:    General: Skin is warm and dry.  Neurological:     General: No focal deficit present.     Mental Status: He is alert and oriented to person, place, and time. Mental status is at baseline.     Cranial Nerves: No cranial nerve deficit.     Sensory: No sensory deficit.     Motor: No weakness.     Coordination: Coordination normal.  Psychiatric:        Mood and Affect: Mood normal.        Behavior: Behavior normal.     ED Results / Procedures / Treatments   Labs (all labs ordered are listed, but only abnormal results are  displayed) Labs Reviewed  COMPREHENSIVE METABOLIC PANEL - Abnormal; Notable for the following components:      Result Value   Glucose, Bld 145 (*)    All other components within normal limits  ETHANOL  CBC  DIFFERENTIAL  RAPID URINE DRUG SCREEN, HOSP PERFORMED  URINALYSIS, ROUTINE W REFLEX MICROSCOPIC    EKG EKG Interpretation Date/Time:  Tuesday November 27 2022 15:04:59 EDT Ventricular Rate:  65 PR Interval:  229 QRS Duration:  104 QT Interval:  412 QTC Calculation: 429 R Axis:   -41  Text Interpretation: Sinus rhythm Atrial premature complex Abnormal R-wave progression, late transition Left ventricular hypertrophy Confirmed by Alvester Chou [phone]) on 11/27/2022 3:08:10 PM  Radiology CT ANGIO HEAD NECK W WO CM  Result Date: 11/27/2022 CLINICAL DATA:  Diplopia.  Double vision starting 3 hours ago. EXAM: CT ANGIOGRAPHY HEAD AND NECK WITH AND WITHOUT CONTRAST TECHNIQUE: Multidetector CT imaging of the head and neck was performed using the standard protocol during bolus administration of intravenous contrast. Multiplanar CT image reconstructions and MIPs were obtained to evaluate the vascular anatomy. Carotid stenosis measurements (when applicable) are obtained utilizing NASCET criteria, using the distal internal carotid diameter as the denominator. RADIATION DOSE REDUCTION: This exam was performed according to the departmental dose-optimization program which includes automated exposure control, adjustment of the mA and/or kV according to patient size and/or use of iterative reconstruction technique. CONTRAST:  80mL OMNIPAQUE IOHEXOL 350 MG/ML SOLN COMPARISON:  None Available. FINDINGS: CT HEAD FINDINGS Brain: Age related volume loss. Chronic small-vessel ischemic changes of the cerebral hemispheric white matter. No sign of acute infarction, mass lesion, hemorrhage, hydrocephalus or extra-axial collection. Vascular: There is atherosclerotic calcification of the major vessels at the base of  the brain. Skull: Negative Sinuses/Orbits: Clear/normal Other: None Review of the MIP images confirms the above findings CTA NECK FINDINGS Aortic arch: Aortic atherosclerosis. Branching pattern is normal without origin stenosis. Right carotid system: Common carotid artery widely patent to the bifurcation. Carotid bifurcation is normal without soft or calcified plaque. Cervical ICA widely patent. Left carotid system: Common carotid artery widely patent to the bifurcation. Carotid bifurcation is normal. Cervical ICA is normal. Vertebral arteries: Both vertebral artery origins are widely patent. Both vertebral arteries are normal through the cervical region to the foramen magnum. Skeleton: Distant cervical fusion C3 through C7. No complicating feature. Other neck: No mass or lymphadenopathy. Upper chest: Negative Review of the MIP images confirms the above  findings CTA HEAD FINDINGS Anterior circulation: Both internal carotid arteries are patent through the skull base and siphon regions. No siphon stenosis. The anterior and middle cerebral vessels are patent. No large vessel occlusion or proximal stenosis. Hypoplastic A1 segment on the right, with both anterior cerebral arteries receiving most of there supply from the left carotid system. Posterior circulation: Both vertebral arteries widely patent through the foramen magnum to the basilar artery. No basilar stenosis. Posterior circulation branch vessels are normal. PCA vessels are normal. Venous sinuses: Patent and normal. Anatomic variants: None significant. Review of the MIP images confirms the above findings IMPRESSION: 1. No acute head CT finding. Age related volume loss and chronic small-vessel ischemic change of the white matter. 2. No intracranial large vessel occlusion or proximal stenosis. 3. No carotid bifurcation disease. 4. Aortic atherosclerosis. 5. Distant cervical fusion C3 through C7. No complicating feature. Aortic Atherosclerosis (ICD10-I70.0).  Electronically Signed   By: Paulina Fusi M.D.   On: 11/27/2022 17:28    Procedures Procedures    Medications Ordered in ED Medications  iohexol (OMNIPAQUE) 350 MG/ML injection 100 mL (80 mLs Intravenous Contrast Given 11/27/22 1654)    ED Course/ Medical Decision Making/ A&P                                 Medical Decision Making Amount and/or Complexity of Data Reviewed Radiology: ordered.   This patient presents to the ED with concern for transient episode of diplopia. This involves an extensive number of treatment options, and is a complaint that carries with it a high risk of complications and morbidity.  The differential diagnosis includes isolated cranial nerve palsy of extraocular muscles, versus TIA, versus other.  This is less likely an acute intraocular pathology, low suspicion for retinal detachment, CRAO or CRVO.  NIHSS 0 on arrival - symptoms have completely resolved - no code stroke activation.  But we will pursue TIA workup with CTA here and likely transfer to MRI at [facility].  Co-morbidities that complicate the patient evaluation: HTN, HLD are stroke risk factors  Additional history obtained from patient's wife  I ordered and personally interpreted labs.  The pertinent results include: No emergent finding  I ordered imaging studies including CT angio head and neck I independently visualized and interpreted imaging which showed no emergent finding I agree with the radiologist interpretation  The patient was maintained on a cardiac monitor.  I personally viewed and interpreted the cardiac monitored which showed an underlying rhythm of: Sinus rhythm  Per my interpretation the patient's ECG shows no acute ischemic findings  I have reviewed the patients home medicines and have made adjustments as needed   After the interventions noted above, I reevaluated the patient and found that they have: improved   Dispostion:  Plan for POV transfer to [facility], ED for MRI  of the brain to complete TIA evaluation to rule out.  If there is no acute finding the patient could be discharged home.  Otherwise if there is concern for stroke or TIA he will likely require admission and neurology consult.  At the time of transfer the patient was reassessed, the risk and benefits were explained.  Stroke scale remains 0.  His wife will be driving him directly to the [facility] ED.  Dr Doran Durand accepting ED provider at [facility].        Final Clinical Impression(s) / ED Diagnoses Final diagnoses:  Diplopia  Rx / DC Orders ED Discharge Orders     None         Dorla Guizar, Kermit Balo, MD 11/27/22 [phone]

## 2022-11-27 NOTE — Telephone Encounter (Signed)
Final outcome: Go to ED Now. Patient called in prior to triage note being sent and stated nurse informed him to either be seen if office or go to ED. I scheduled patient with PCP today @ 2:40pm. Provider later re-directed patient to go to ED since STAT imaging not available in office. Pt was notified and verbalized understanding. Per chart he is currently at the ED.    Patient Name First: Taylor Last: Berger Gender: Male DOB: [birth year] Age: 77 Y 8 M 13 D Return Phone Number: [phone] (Primary), [phone] (Secondary) Address: City/ State/ Zip: [place]  [address] Client [facility] - Administrator, [facility] at [facility] Provider Bufford Buttner, [place] Contact Type Call Who Is Calling Patient / Member / Family / Caregiver Call Type Triage / Clinical Relationship To Patient Self Return Phone Number [phone] (Primary) Chief Complaint Double Vision Reason for Call Symptomatic / Request for Health Information Initial Comment Caller states PT has double vision. Translation No Nurse Assessment Nurse: [name], RN, Denise Date/Time (Eastern Time): 11/27/2022 1:57:04 PM Confirm and document reason for call. If symptomatic, describe symptoms. ---Pt has double vision that started 1 hr ago w/o other s/s. Does the patient have any new or worsening symptoms? ---Yes Will a triage be completed? ---Yes Related visit to physician within the last 2 weeks? ---No Does the PT have any chronic conditions? (i.e. diabetes, asthma, this includes High risk factors for pregnancy, etc.) ---No Is this a behavioral health or substance abuse call? ---No Guidelines Guideline Title Affirmed Question Affirmed Notes Nurse Date/Time [name] Time) Vision Loss or Change Double vision Carmon, RN, [name] 11/27/2022 1:57:44 PM Disp. Time [name] Time) Disposition Final User 11/27/2022 1:59:19 PM Go to ED Now (or PCP triage) Yes [name], RN,  [name] Final Disposition 11/27/2022 1:59:19 PM Go to ED Now (or PCP triage) Yes Carmon, RN, [name] Disagree/Comply Comply Caller Understands Yes PreDisposition Call Doctor Care Advice Given Per Guideline GO TO ED NOW (OR PCP TRIAGE): ALTERNATE DISPOSITION - CALL EYE DOCTOR NOW: * If you have your own eye doctor (ophthalmologist), call them now. ANOTHER ADULT SHOULD DRIVE: * It is better and safer if another adult drives instead of you. CARE ADVICE given per Vision Loss or Change (Adult) guideline.

## 2022-11-27 NOTE — ED Notes (Signed)
Pt aware of the need for a urine... Unable to currently provide the sample... 

## 2022-11-27 NOTE — Discharge Instructions (Addendum)
Your MRI today was reassuring.  I recommend you follow-up with your doctor.  If you develop any new concerning symptoms you should return to the ED.

## 2022-11-27 NOTE — ED Notes (Signed)
Pt informed of the transport to [facility]... Pt understood to drive to [facility] as soon as possible... If the symptoms start again to stop and call 911... Pt and family understood.

## 2022-11-27 NOTE — ED Triage Notes (Addendum)
Pt to ED c/o double vision that started 3 hours ago when watching tv.  Reports symptoms have since improved. Hx cataract surgery 6 months. Ago. No neuro deficits noted in triage.

## 2022-11-27 NOTE — Telephone Encounter (Signed)
FYI: This call has been transferred to triage nurse: Access Nurse. Once the result note has been entered staff can address the message at that time.  Patient called in with the following symptoms:  Red Word: Double vision-- sudden, still occurring; no other symptoms   Please advise at [facility] [phone]  Message is routed to Provider Pool.

## 2022-11-28 NOTE — Telephone Encounter (Signed)
FYI, See Triage note. Pt went to the ED.

## 2022-12-10 ENCOUNTER — Ambulatory Visit (INDEPENDENT_AMBULATORY_CARE_PROVIDER_SITE_OTHER): Payer: Medicare Other | Admitting: Physician Assistant

## 2022-12-10 ENCOUNTER — Encounter: Payer: Self-pay | Admitting: Physician Assistant

## 2022-12-10 VITALS — BP 112/80 | HR 76 | Temp 97.8°F | Ht 68.0 in | Wt 204.0 lb

## 2022-12-10 DIAGNOSIS — I10 Essential (primary) hypertension: Secondary | ICD-10-CM | POA: Diagnosis not present

## 2022-12-10 DIAGNOSIS — R1031 Right lower quadrant pain: Secondary | ICD-10-CM

## 2022-12-10 DIAGNOSIS — R053 Chronic cough: Secondary | ICD-10-CM | POA: Diagnosis not present

## 2022-12-10 NOTE — Patient Instructions (Signed)
It was great to see you!  Stop lisinopril 5 mg -- I need to make sure that this is not contributing to your cough Keep an eye on your blood pressure -- if blood pressure is >150/90 consistently, let me know  I suspect possible groin strain  Review handout Try exercises and keep me posted if any worsening or new symptom(s)   Take care,  Jarold Motto PA-C

## 2022-12-10 NOTE — Progress Notes (Signed)
Taylor Berger is a 77 y.o. male here for a new problem.  History of Present Illness:   Chief Complaint  Patient presents with   thigh pain    Pt stated that he has been having some Rt inner thigh pain for the past week near the groin area   HPI  Joint Pain  He complains today of inner thigh pain around his groin area.  He states that he notices that pain more when standing up, walking, or going up/down the stairs.  The pain had started about a week ago.  He denies doing any strength training or preforming any unusual exercises. He also states that he typically wakes up twice a night to urinate. There is no change in this. He denies any pain when urination or urgency, new abdominal pain, or further pain when coughing. He does reports having some bowel changes as his stool has been alternating between soft and hard stool. Seeing gastroenterology for this currently.   Chronic Cough  He complains of a chronic cough despite taking antihistamine and floanse to help relieve his symptoms.   He states that he usually takes Nexium at night  to help prevent any acid reflux.  He denies any breakthrough heartburn. Of note, he is on lisinopril 5 mg daily and has been on this for several years.  HTN Currently taking amlodipine 10 mg daily and lisinopril 5 mg. At home blood pressure readings are: normal. Patient denies chest pain, SOB, blurred vision, dizziness, unusual headaches, lower leg swelling. Patient is compliant with medication. Denies excessive caffeine intake, stimulant usage, excessive alcohol intake, or increase in salt consumption.  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, stomach   Neuropathy of both feet      Social History   Tobacco Use   Smoking status: Never   Smokeless tobacco: Never  Vaping Use   Vaping status: Never Used  Substance Use Topics   Alcohol use: Not Currently   Drug use: Never    Past Surgical History:  Procedure Laterality Date   CARPAL TUNNEL RELEASE Left    CERVICAL DISCECTOMY  1995   C-7   CERVICAL FUSION  01/1989   C-3 through C-6 / no metal   COLONOSCOPY     ELBOW SURGERY Left    FOOT SURGERY Left 1999   Plantar fascitis   low back fusion  2013   Lumbar & Low Thoracic with metal   SHOULDER ARTHROSCOPY WITH ROTATOR CUFF REPAIR Right 2014   UPPER GASTROINTESTINAL ENDOSCOPY      Family History  Problem Relation Age of Onset   Diabetes Mother    Hypertension Mother    Hyperlipidemia Mother    Heart failure Mother    Leukemia Father    Stroke Father    Esophageal cancer Neg Hx    Colon polyps Neg Hx    Pancreatic cancer Neg Hx    Stomach cancer Neg Hx    Rectal cancer Neg Hx     No Known Allergies  Current Medications:   Current Outpatient Medications:    amLODipine (NORVASC)  10 MG tablet, Take 1 tablet by mouth once daily, Disp: 90 tablet, Rfl: 0   atorvastatin (LIPITOR) 80 MG tablet, Take 1 tablet by mouth once daily, Disp: 90 tablet, Rfl: 0   cetirizine (ZYRTEC) 10 MG tablet, Take 10 mg by mouth daily., Disp: , Rfl:    clindamycin (CLEOCIN-T) 1 % external solution, Apply topically 2 (two) times daily as needed., Disp: 30 mL, Rfl: 1   esomeprazole (NEXIUM) 20 MG capsule, Take 20 mg by mouth daily at 12 noon., Disp: , Rfl:    FIBER ADULT GUMMIES PO, Take 2 each by mouth daily in the afternoon., Disp: , Rfl:    fluticasone (FLONASE) 50 MCG/ACT nasal spray, Place into both nostrils as needed for allergies or rhinitis., Disp: , Rfl:    lisinopril (ZESTRIL) 5 MG tablet, Take 1 tablet by mouth once daily, Disp: 90 tablet, Rfl: 0   Probiotic Product (PROBIOTIC GUMMIES PO), Take 3 each by mouth  daily in the afternoon., Disp: , Rfl:    rivaroxaban (XARELTO) 10 MG TABS tablet, Take 1 tablet (10 mg total) by mouth daily., Disp: 90 tablet, Rfl: 2   traZODone (DESYREL) 50 MG tablet, Take 1 tablet (50 mg total) by mouth at bedtime as needed for sleep., Disp: 30 tablet, Rfl: 1   valACYclovir (VALTREX) 500 MG tablet, Take 1 tablet (500 mg total) by mouth daily., Disp: 90 tablet, Rfl: 1   Review of Systems:   Review of Systems  Respiratory:  Positive for cough.   Musculoskeletal:        +hip pain     Vitals:   Vitals:   12/10/22 1151  BP: 112/80  Pulse: 76  Temp: 97.8 F (36.6 C)  SpO2: 97%  Weight: 204 lb (92.5 kg)  Height: 5\' 8"  (1.727 m)     Body mass index is 31.02 kg/m.  Physical Exam:   Physical Exam Vitals and nursing note reviewed.  Constitutional:      Appearance: He is well-developed.  HENT:     Head: Normocephalic.  Eyes:     Conjunctiva/sclera: Conjunctivae normal.     Pupils: Pupils are equal, round, and reactive to light.  Pulmonary:     Effort: Pulmonary effort is normal.  Genitourinary:    Testes: Normal.     Comments: No herniation noted Musculoskeletal:        General: Normal range of motion.     Cervical back: Normal range of motion.     Comments: No tenderness to palpation or abnormalities palpated in R groin  Skin:    General: Skin is warm and dry.  Neurological:     Mental Status: He is alert and oriented to person, place, and time.  Psychiatric:        Behavior: Behavior normal.        Thought Content: Thought content normal.        Judgment: Judgment normal.     Assessment and Plan:   Right groin pain No red flags on exam Suspect possible groin strain Handout provided on exercises If new/worsening/lack of improvement -- will refer to sports medicine for further evaluation  Chronic cough No red flags Advised patient to stop lisinopril and reach out in 2-4 weeks via MyChart to let us know how cough and blood pressure are  doing and we will advise accordingly  HTN Well controlled Continue amlodipine 10 mg daily Hold lisinopril to rule out ACE-induced cough Advised patient to stop lisinopril and reach out in 2-4 weeks  via MyChart to let us know how cough and blood pressure are doing and we will advise accordingly   I,Safa M Kadhim,acting as a scribe for Jarold Motto, PA.,have documented all relevant documentation on the behalf of Jarold Motto, PA,as directed by  Jarold Motto, PA while in the presence of Jarold Motto, [place].   I, Jarold Motto, [place], have reviewed all documentation for this visit. The documentation on 12/10/22 for the exam, diagnosis, procedures, and orders are all accurate and complete.   Jarold Motto, PA-C

## 2022-12-12 ENCOUNTER — Telehealth: Payer: Self-pay | Admitting: *Deleted

## 2022-12-12 NOTE — Telephone Encounter (Signed)
   Pre-operative Risk Assessment    Patient Name: Taylor Berger  DOB: [birth year] MRN: [id number]    DATE OF LAST VISIT: 08/15/22 Carlos Levering, NP DATE OF NEXT VISIT: 02/27/23 DR. ACHARYA  Request for Surgical Clearance    Procedure:   RIGHT CARPAL TUNNEL RELEASE, RIGHT CUBITAL TUNNEL RELEASE WITH POSSIBLE ANTERIOR TRANSPOSITION   Date of Surgery:  Clearance TBD                                 Surgeon:  DR. CHARLES BENFIELD Surgeon's Group or Practice Name:  [facility] Phone number:  [phone] ATTN: KERRI MAZE Fax number:  [phone]   Type of Clearance Requested:   - Medical  - Pharmacy:  Hold Rivaroxaban (Xarelto)     Type of Anesthesia:  MAC WITH REGIONAL   Additional requests/questions:    Elpidio Anis   12/12/2022, 5:22 PM

## 2022-12-13 ENCOUNTER — Telehealth: Payer: Self-pay | Admitting: *Deleted

## 2022-12-13 NOTE — Telephone Encounter (Signed)
   Name: Taylor Berger  DOB: June 14, 1945  MRN: [id number]  Primary Cardiologist: Parke Poisson, MD   Preoperative team, please contact this patient and set up a phone call appointment for further preoperative risk assessment. Please obtain consent and complete medication review. Thank you for your help.  I confirm that guidance regarding antiplatelet and oral anticoagulation therapy has been completed and, if necessary, noted below.  Pt on Xarelto for history of recurrent DVT, managed per PCP. Recommendations for holding Xarelto prior to surgery should come from managing provider.    Joylene Grapes, NP 12/13/2022, 8:15 AM [facility]

## 2022-12-13 NOTE — Telephone Encounter (Signed)
Pt has been scheduled a tele visit, 12/27/22 10:20.  Consent on file / medications reconciled.

## 2022-12-13 NOTE — Telephone Encounter (Signed)
Pt has been scheduled a tele visit, 12/27/22 10:20.  Consent on file / medications reconciled.     Patient Consent for Virtual Visit        Lucino Rash has provided verbal consent on 12/13/2022 for a virtual visit (video or telephone).   CONSENT FOR VIRTUAL VISIT FOR:  Taylor Berger  By participating in this virtual visit I agree to the following:  I hereby voluntarily request, consent and authorize [facility] and its employed or contracted physicians, physician assistants, nurse practitioners or other licensed health care professionals (the Practitioner), to provide me with telemedicine health care services (the "Services") as deemed necessary by the treating Practitioner. I acknowledge and consent to receive the Services by the Practitioner via telemedicine. I understand that the telemedicine visit will involve communicating with the Practitioner through live audiovisual communication technology and the disclosure of certain medical information by electronic transmission. I acknowledge that I have been given the opportunity to request an in-person assessment or other available alternative prior to the telemedicine visit and am voluntarily participating in the telemedicine visit.  I understand that I have the right to withhold or withdraw my consent to the use of telemedicine in the course of my care at any time, without affecting my right to future care or treatment, and that the Practitioner or I may terminate the telemedicine visit at any time. I understand that I have the right to inspect all information obtained and/or recorded in the course of the telemedicine visit and may receive copies of available information for a reasonable fee.  I understand that some of the potential risks of receiving the Services via telemedicine include:  Delay or interruption in medical evaluation due to technological equipment failure or disruption; Information transmitted may not be sufficient (e.g.  poor resolution of images) to allow for appropriate medical decision making by the Practitioner; and/or  In rare instances, security protocols could fail, causing a breach of personal health information.  Furthermore, I acknowledge that it is my responsibility to provide information about my medical history, conditions and care that is complete and accurate to the best of my ability. I acknowledge that Practitioner's advice, recommendations, and/or decision may be based on factors not within their control, such as incomplete or inaccurate data provided by me or distortions of diagnostic images or specimens that may result from electronic transmissions. I understand that the practice of medicine is not an exact science and that Practitioner makes no warranties or guarantees regarding treatment outcomes. I acknowledge that a copy of this consent can be made available to me via my patient portal [facility] MyChart), or I can request a printed copy by calling the office of [facility].    I understand that my insurance will be billed for this visit.   I have read or had this consent read to me. I understand the contents of this consent, which adequately explains the benefits and risks of the Services being provided via telemedicine.  I have been provided ample opportunity to ask questions regarding this consent and the Services and have had my questions answered to my satisfaction. I give my informed consent for the services to be provided through the use of telemedicine in my medical care

## 2022-12-14 ENCOUNTER — Other Ambulatory Visit: Payer: Self-pay | Admitting: Hematology and Oncology

## 2022-12-14 ENCOUNTER — Inpatient Hospital Stay: Payer: Medicare Other | Attending: Hematology and Oncology

## 2022-12-14 ENCOUNTER — Inpatient Hospital Stay (HOSPITAL_BASED_OUTPATIENT_CLINIC_OR_DEPARTMENT_OTHER): Payer: Medicare Other | Admitting: Hematology and Oncology

## 2022-12-14 VITALS — BP 139/78 | HR 60 | Temp 97.7°F | Resp 13 | Wt 206.4 lb

## 2022-12-14 DIAGNOSIS — I82411 Acute embolism and thrombosis of right femoral vein: Secondary | ICD-10-CM | POA: Diagnosis not present

## 2022-12-14 DIAGNOSIS — C8203 Follicular lymphoma grade I, intra-abdominal lymph nodes: Secondary | ICD-10-CM | POA: Diagnosis not present

## 2022-12-14 DIAGNOSIS — C8200 Follicular lymphoma grade I, unspecified site: Secondary | ICD-10-CM | POA: Diagnosis present

## 2022-12-14 LAB — CMP (CANCER CENTER ONLY)
ALT: 17 U/L (ref 0–44)
AST: 17 U/L (ref 15–41)
Albumin: 4 g/dL (ref 3.5–5.0)
Alkaline Phosphatase: 69 U/L (ref 38–126)
Anion gap: 6 (ref 5–15)
BUN: 17 mg/dL (ref 8–23)
CO2: 26 mmol/L (ref 22–32)
Calcium: 9.5 mg/dL (ref 8.9–10.3)
Chloride: 107 mmol/L (ref 98–111)
Creatinine: 1.07 mg/dL (ref 0.61–1.24)
GFR, Estimated: >60 mL/min (ref >60)
Glucose, Bld: 176 mg/dL — ABNORMAL HIGH (ref 70–99)
Potassium: 3.7 mmol/L (ref 3.5–5.1)
Sodium: 139 mmol/L (ref 135–145)
Total Bilirubin: 0.8 mg/dL (ref 0.3–1.2)
Total Protein: 6.8 g/dL (ref 6.5–8.1)

## 2022-12-14 LAB — CBC WITH DIFFERENTIAL (CANCER CENTER ONLY)
Abs Immature Granulocytes: 0.06 10*3/uL (ref 0.00–0.07)
Basophils Absolute: 0.1 10*3/uL (ref 0.0–0.1)
Basophils Relative: 1 %
Eosinophils Absolute: 0.3 10*3/uL (ref 0.0–0.5)
Eosinophils Relative: 4 %
HCT: 41 % (ref 39.0–52.0)
Hemoglobin: 14.4 g/dL (ref 13.0–17.0)
Immature Granulocytes: 1 %
Lymphocytes Relative: 19 %
Lymphs Abs: 1.2 10*3/uL (ref 0.7–4.0)
MCH: 33.9 pg (ref 26.0–34.0)
MCHC: 35.1 g/dL (ref 30.0–36.0)
MCV: 96.5 fL (ref 80.0–100.0)
Monocytes Absolute: 0.6 10*3/uL (ref 0.1–1.0)
Monocytes Relative: 9 %
Neutro Abs: 4 10*3/uL (ref 1.7–7.7)
Neutrophils Relative %: 66 %
Platelet Count: 220 10*3/uL (ref 150–400)
RBC: 4.25 MIL/uL (ref 4.22–5.81)
RDW: 12.2 % (ref 11.5–15.5)
WBC Count: 6.1 10*3/uL (ref 4.0–10.5)
nRBC: 0 % (ref 0.0–0.2)

## 2022-12-14 LAB — LACTATE DEHYDROGENASE: LDH: 190 U/L (ref 98–192)

## 2022-12-14 NOTE — Progress Notes (Unsigned)
[facility] [phone]   [phone]  PROGRESS NOTE  Patient Care Team: Jarold Motto, [place] as PCP - General (Physician Assistant) Parke Poisson, MD as PCP - Cardiology (Cardiology)  Hematological/Oncological History # Follicular Lymphoma Stage I Grade IA 1) 05/20/2019: CT scan showed stable mesenteric nodule, largest 4.2 cm and mild periaortic lymphadenopathy.  2) 06/04/2019: biopsy of abdominal lymph node showed grade 1 follicular lymphoma. Observation recommended by his oncologist at [facility] in [place], [place].  3) 03/14/2020: establish care with Dr. Leonides Schanz  4) 04/13/2020: CT scan showed stable mesenteric lymphadenopathy, measuring 3.9 x 1.9 cm. 5) 11/17/2020: presented to ED for abdominal discomfort. Underwent CT A/P and found to have relatively stable lymph node measuring 4.3 x 2.3 cm.  6) 08/01/2021: CT Ab/Pelvis ordered for LLQ pain. Fidings show mesenteric lymph node conglomerate, largest component measuring up to 4.3 x 2.2 cm.  HISTORY OF PRESENTING ILLNESS:  Taylor Berger 77 y.o. male with medical history significant for HTN, HLD, DVT ,GERD, and follicular lymphoma (diagnosed 06/2019 and under observation) presents for a follow up visit for follicular lymphoma and recurrent RLE DVT.   On exam today Taylor Berger notes he has continued on his Xarelto 10 mg p.o. daily without any difficulty.  He reports he does have some occasional cramping in his hands and some neuropathy in his feet.  He is tolerating Xarelto well with no bleeding, bruising, or dark stools.  He reports that he is back on magnesium pills due to the cramping he is having in his legs and elsewhere.  He notes he is not having any bumps, lumps, or weight loss concerning for progression of his lymphoma.  His appetite has been good.  He reports that he does feel "cold all the time".  His weight has been steady at 205 pounds though he notes he would like to be lighter  around the 175-180 range.  Overall he is at his baseline level of health with no questions concerns or complaints today.  He denies any bleeding, bruising, or dark stools.  He denies any fevers, chills, sweats, nausea vomiting or diarrhea.  Otherwise a full point ROS is listed below.   MEDICAL HISTORY:  Past Medical History:  Diagnosis Date   Allergy    Seasonal   Aortic aneurysm (HCC)    Depression    DVT (deep venous thrombosis) (HCC) 2020   GERD (gastroesophageal reflux disease)    Goiter    Hyperlipidemia    Hypertension    Lymphoma (HCC) 2020   Follicular, stomach   Neuropathy of both feet     SURGICAL HISTORY: Past Surgical History:  Procedure Laterality Date   CARPAL TUNNEL RELEASE Left    CERVICAL DISCECTOMY  1995   C-7   CERVICAL FUSION  01/1989   C-3 through C-6 / no metal   COLONOSCOPY     ELBOW SURGERY Left    FOOT SURGERY Left 1999   Plantar fascitis   low back fusion  2013   Lumbar & Low Thoracic with metal   SHOULDER ARTHROSCOPY WITH ROTATOR CUFF REPAIR Right 2014   UPPER GASTROINTESTINAL ENDOSCOPY      SOCIAL HISTORY: Social History   Socioeconomic History   Marital status: Married    Spouse name: Not on file   Number of children: Not on file   Years of education: Not on file   Highest education level: Master's degree (e.g., MA, MS, MEng, MEd, MSW, MBA)  Occupational History  Occupation: retired   Tobacco Use   Smoking status: Never   Smokeless tobacco: Never  Vaping Use   Vaping status: Never Used  Substance and Sexual Activity   Alcohol use: Not Currently   Drug use: Never   Sexual activity: Not Currently  Other Topics Concern   Not on file  Social History Narrative   ** Merged History Encounter **       Moved from [facility] to be closer to son Married   Social Determinants of Health   Financial Resource Strain: Low Risk  (08/17/2022)   Overall Financial Resource Strain (CARDIA)    Difficulty of Paying Living Expenses: Not hard at all   Food Insecurity: No Food Insecurity (08/17/2022)   Hunger Vital Sign    Worried About Running Out of Food in the Last Year: Never true    Ran Out of Food in the Last Year: Never true  Transportation Needs: No Transportation Needs (08/17/2022)   PRAPARE - Administrator, Civil Service (Medical): No    Lack of Transportation (Non-Medical): No  Physical Activity: Inactive (08/17/2022)   Exercise Vital Sign    Days of Exercise per Week: 0 days    Minutes of Exercise per Session: 20 min  Stress: No Stress Concern Present (08/17/2022)   [employer] of Occupational Health - Occupational Stress Questionnaire    Feeling of Stress : Only a little  Social Connections: Socially Isolated (08/17/2022)   Social Connection and Isolation Panel [NHANES]    Frequency of Communication with Friends and Family: Once a week    Frequency of Social Gatherings with Friends and Family: Once a week    Attends Religious Services: Never    Database administrator or Organizations: No    Attends Banker Meetings: Never    Marital Status: Married  Catering manager Violence: Not At Risk (07/23/2022)   Humiliation, Afraid, Rape, and Kick questionnaire    Fear of Current or Ex-Partner: No    Emotionally Abused: No    Physically Abused: No    Sexually Abused: No    FAMILY HISTORY: Family History  Problem Relation Age of Onset   Diabetes Mother    Hypertension Mother    Hyperlipidemia Mother    Heart failure Mother    Leukemia Father    Stroke Father    Esophageal cancer Neg Hx    Colon polyps Neg Hx    Pancreatic cancer Neg Hx    Stomach cancer Neg Hx    Rectal cancer Neg Hx     ALLERGIES:  has No Known Allergies.  MEDICATIONS:  Current Outpatient Medications  Medication Sig Dispense Refill   amLODipine (NORVASC) 10 MG tablet Take 1 tablet by mouth once daily 90 tablet 0   atorvastatin (LIPITOR) 80 MG tablet Take 1 tablet by mouth once daily 90 tablet 0   cetirizine  (ZYRTEC) 10 MG tablet Take 10 mg by mouth daily.     clindamycin (CLEOCIN-T) 1 % external solution Apply topically 2 (two) times daily as needed. 30 mL 1   esomeprazole (NEXIUM) 20 MG capsule Take 20 mg by mouth daily at 12 noon.     FIBER ADULT GUMMIES PO Take 2 each by mouth daily in the afternoon.     fluticasone (FLONASE) 50 MCG/ACT nasal spray Place into both nostrils as needed for allergies or rhinitis.     lisinopril (ZESTRIL) 5 MG tablet Take 1 tablet by mouth once daily 90 tablet 0  Probiotic Product (PROBIOTIC GUMMIES PO) Take 3 each by mouth daily in the afternoon.     rivaroxaban (XARELTO) 10 MG TABS tablet Take 1 tablet (10 mg total) by mouth daily. 90 tablet 2   traZODone (DESYREL) 50 MG tablet Take 1 tablet (50 mg total) by mouth at bedtime as needed for sleep. 30 tablet 1   valACYclovir (VALTREX) 500 MG tablet Take 1 tablet (500 mg total) by mouth daily. 90 tablet 1   No current facility-administered medications for this visit.    REVIEW OF SYSTEMS:   Constitutional: ( - ) fevers, ( - )  chills , ( - ) night sweats Eyes: ( - ) blurriness of vision, ( - ) double vision, ( - ) watery eyes Ears, nose, mouth, throat, and face: ( - ) mucositis, ( - ) sore throat Respiratory: ( - ) cough, ( - ) dyspnea, ( - ) wheezes Cardiovascular: ( - ) palpitation, ( - ) chest discomfort, ( - ) lower extremity swelling Gastrointestinal:  ( - ) nausea, ( - ) heartburn, ( - ) change in bowel habits Skin: ( - ) abnormal skin rashes Lymphatics: ( - ) new lymphadenopathy, ( - ) easy bruising Neurological: ( - ) tingling, ( - ) new weaknesses. + chronic neuropathy in lower extremities Behavioral/Psych: ( - ) mood change, ( - ) new changes  All other systems were reviewed with the patient and are negative.  PHYSICAL EXAMINATION: ECOG PERFORMANCE STATUS: 0 - Asymptomatic  There were no vitals filed for this visit.     There were no vitals filed for this visit.    GENERAL: well appearing  elderly Caucasian male, appears younger than stated age, in NAD  SKIN: skin color, texture, turgor are normal, no rashes or significant lesions EYES: conjunctiva are pink and non-injected, sclera clear LUNGS: clear to auscultation and percussion with normal breathing effort HEART: regular rate & rhythm and no murmurs. No LE edema or erythema. Musculoskeletal: no cyanosis of digits and no clubbing  PSYCH: alert & oriented x 3, fluent speech NEURO: no focal motor/sensory deficits  LABORATORY DATA:  I have reviewed the data as listed    Latest Ref Rng & Units 11/27/2022    2:58 PM 06/13/2022    8:28 AM 05/23/2022   10:04 AM  CBC  WBC 4.0 - 10.5 K/uL 7.5  8.4  6.6   Hemoglobin 13.0 - 17.0 g/dL 60.4  54.0  98.1   Hematocrit 39.0 - 52.0 % 42.5  41.8  42.5   Platelets 150 - 400 K/uL 204  210  215.0        Latest Ref Rng & Units 11/27/2022    2:58 PM 06/13/2022    8:28 AM 05/23/2022   10:04 AM  CMP  Glucose 70 - 99 mg/dL 191  478  295   BUN 8 - 23 mg/dL 17  23  23    Creatinine 0.61 - 1.24 mg/dL 6.21  3.08  6.57   Sodium 135 - 145 mmol/L 140  141  141   Potassium 3.5 - 5.1 mmol/L 3.9  3.4  3.6   Chloride 98 - 111 mmol/L 108  107  107   CO2 22 - 32 mmol/L 24  29  24    Calcium 8.9 - 10.3 mg/dL 84.6  96.2  95.2   Total Protein 6.5 - 8.1 g/dL 6.9  7.0  6.5   Total Bilirubin 0.3 - 1.2 mg/dL 1.1  1.0  1.1   Alkaline  Phos 38 - 126 U/L 59  74  67   AST 15 - 41 U/L 18  23  21    ALT 0 - 44 U/L 19  32  25      PATHOLOGY:     RADIOGRAPHIC STUDIES: I have personally reviewed the radiological images as listed and agreed with the findings in the report. MR BRAIN WO CONTRAST  Result Date: 11/27/2022 CLINICAL DATA:  Transient ischemic attack and diplopia EXAM: MRI HEAD WITHOUT CONTRAST TECHNIQUE: Multiplanar, multiecho pulse sequences of the brain and surrounding structures were obtained without intravenous contrast. COMPARISON:  11/27/2022 CTA head neck FINDINGS: Brain: No acute infarct, mass  effect or extra-axial collection. No acute or chronic hemorrhage. There is multifocal hyperintense T2-weighted signal within the white matter. Generalized volume loss. Incidental cavum septum pellucidum et vergae. Vascular: Major flow voids are preserved. Skull and upper cervical spine: Normal calvarium and skull base. Visualized upper cervical spine and soft tissues are normal. Sinuses/Orbits:No paranasal sinus fluid levels or advanced mucosal thickening. No mastoid or middle ear effusion. Normal orbits. IMPRESSION: 1. No acute intracranial abnormality. 2. Chronic small vessel ischemia and volume loss. Electronically Signed   By: Deatra Robinson M.D.   On: 11/27/2022 20:34   CT ANGIO HEAD NECK W WO CM  Result Date: 11/27/2022 CLINICAL DATA:  Diplopia.  Double vision starting 3 hours ago. EXAM: CT ANGIOGRAPHY HEAD AND NECK WITH AND WITHOUT CONTRAST TECHNIQUE: Multidetector CT imaging of the head and neck was performed using the standard protocol during bolus administration of intravenous contrast. Multiplanar CT image reconstructions and MIPs were obtained to evaluate the vascular anatomy. Carotid stenosis measurements (when applicable) are obtained utilizing NASCET criteria, using the distal internal carotid diameter as the denominator. RADIATION DOSE REDUCTION: This exam was performed according to the departmental dose-optimization program which includes automated exposure control, adjustment of the mA and/or kV according to patient size and/or use of iterative reconstruction technique. CONTRAST:  80mL OMNIPAQUE IOHEXOL 350 MG/ML SOLN COMPARISON:  None Available. FINDINGS: CT HEAD FINDINGS Brain: Age related volume loss. Chronic small-vessel ischemic changes of the cerebral hemispheric white matter. No sign of acute infarction, mass lesion, hemorrhage, hydrocephalus or extra-axial collection. Vascular: There is atherosclerotic calcification of the major vessels at the base of the brain. Skull: Negative  Sinuses/Orbits: Clear/normal Other: None Review of the MIP images confirms the above findings CTA NECK FINDINGS Aortic arch: Aortic atherosclerosis. Branching pattern is normal without origin stenosis. Right carotid system: Common carotid artery widely patent to the bifurcation. Carotid bifurcation is normal without soft or calcified plaque. Cervical ICA widely patent. Left carotid system: Common carotid artery widely patent to the bifurcation. Carotid bifurcation is normal. Cervical ICA is normal. Vertebral arteries: Both vertebral artery origins are widely patent. Both vertebral arteries are normal through the cervical region to the foramen magnum. Skeleton: Distant cervical fusion C3 through C7. No complicating feature. Other neck: No mass or lymphadenopathy. Upper chest: Negative Review of the MIP images confirms the above findings CTA HEAD FINDINGS Anterior circulation: Both internal carotid arteries are patent through the skull base and siphon regions. No siphon stenosis. The anterior and middle cerebral vessels are patent. No large vessel occlusion or proximal stenosis. Hypoplastic A1 segment on the right, with both anterior cerebral arteries receiving most of there supply from the left carotid system. Posterior circulation: Both vertebral arteries widely patent through the foramen magnum to the basilar artery. No basilar stenosis. Posterior circulation branch vessels are normal. PCA vessels are normal. Venous sinuses:  Patent and normal. Anatomic variants: None significant. Review of the MIP images confirms the above findings IMPRESSION: 1. No acute head CT finding. Age related volume loss and chronic small-vessel ischemic change of the white matter. 2. No intracranial large vessel occlusion or proximal stenosis. 3. No carotid bifurcation disease. 4. Aortic atherosclerosis. 5. Distant cervical fusion C3 through C7. No complicating feature. Aortic Atherosclerosis (ICD10-I70.0). Electronically Signed   By: Paulina Fusi M.D.   On: 11/27/2022 17:28     ASSESSMENT & PLAN Taylor Berger 77 y.o. male with medical history significant for HTN, HLD, DVT ,GERD, and follicular lymphoma (diagnosed 06/2019 and under observation) presents to establish care with oncology for his follicular lymphoma.  After review the labs, review the records, discussion with the patient the findings are most consistent with a low-grade stage I follicular lymphoma of the lymph node of the abdomen.  At this time there is no indication for treatment as there is no bulky disease, no B symptoms, and no cytopenias.  We can continue to follow this patient in clinic with lab work and clinical follow-up.   #Grade IA Follicular Lymphoma --diagnosed in [place] in March 2021. Biopsy confirms Grade IA follicular lymphoma with a Ki-67 of 5-10%. Currently on observation.  --Reviewed labs from today without any intervention needed. CT abdomen/pelvis from 08/01/2021 revealed mesenteric lymphadenopathy measuring 4.3x 2.2 cm, not significantly enlarged from prior. Recommendation is to continue with observation and return in 6 months with lab work. Last CT scan showed stable disease, no indication for repeat imaging. Plan: -- Baseline labs to include CBC, CMP, LDH each visit.  -- labs today show white blood cell count *** -- no need for routine imaging unless patient were to develop worsening blood counts or new symptoms.  --continue monitoring with q 6 month clinic visits and labs  #DVT x 2 episodes.  --Initially diagnosed with DVT of the right femoral through popliteal veins on 12/10/2018. Treated with 6 months of anticoagulation therapy. At initial visit, discussed the risks of recurrent VTE and the recommendation for indefinite anticoagulation as it was likely an unprovoked event. Patient elected to continue with observation for recurrent clot. --On 03/30/20, patient had another DVT of the right lower extremity. Doppler US was obtained that revealed acute DVT  in the right gastrocnemius veins, right intramuscular calf vein. In addition, there were two pairs of acutely thrombosed veins appear to arise from the popliteal vein and are thrombosed to the mid calf. No acute DVT in the popliteal vein. There was evidence of chronic deep vein thrombosis involving the right femoral vein, and popliteal vein. Likely provoked by recent episode of diverticulitis. Patient was started on Xarelto 20mg  PO daily.  --Discussed the recommendation for indefinite anticoagulation due to recurrent episodes of DVTs, with initial episode that was likely unprovoked. Patient was in agreement with this recommendation.  Plan:  --continue Xaretlo 10 mg PO daily as maintenance therapy. Will continue indefinitely.  --RTC in 6 months time  #Health maintenance --Patient follow with GI for Barrett's esophagus.  --Patient is up to date on COVID and influenza vaccinations.   #Right mid thyroid nodule --Underwent fine needle aspiration on 05/31/20, results show benign tissue.   No orders of the defined types were placed in this encounter.   All questions were answered. The patient knows to call the clinic with any problems, questions or concerns.  Ulysees Barns, MD Department of Hematology/Oncology [facility] Phone: [phone] Pager: [phone] Email:  [email or web address]  12/14/2022 1:36 PM

## 2022-12-27 ENCOUNTER — Encounter: Payer: Self-pay | Admitting: Physician Assistant

## 2022-12-27 ENCOUNTER — Ambulatory Visit: Payer: Medicare Other

## 2022-12-27 NOTE — Telephone Encounter (Signed)
I contacted the patient for virtual telephone visit for preop clearance, however Taylor Berger says his surgery is not urgent and he is planning to delay the surgery until next year.  He has other issues he wished to discuss with Dr. Jacques Navy in the office such as worsening shortness of breath.  In this case we will cancel his virtual telephone visit and have him keep his appointment with Dr. Jacques Navy

## 2022-12-27 NOTE — Progress Notes (Signed)
This encounter was created in error - please disregard.

## 2022-12-31 NOTE — Telephone Encounter (Signed)
Our office received a duplicate request: I will fax notes over where pt has stated he is not planning the surgery until next year.    Per Azalee Course, PAC: 12/27/22 @ 10:30 am I contacted the patient for virtual telephone visit for preop clearance, however Taylor Berger says his surgery is not urgent and he is planning to delay the surgery until next year.  He has other issues he wished to discuss with Taylor Berger in the office such as worsening shortness of breath.  In this case we will cancel his virtual telephone visit and have him keep his appointment with Taylor Berger

## 2023-01-10 ENCOUNTER — Other Ambulatory Visit: Payer: Self-pay | Admitting: Physician Assistant

## 2023-02-11 ENCOUNTER — Ambulatory Visit (INDEPENDENT_AMBULATORY_CARE_PROVIDER_SITE_OTHER): Payer: Medicare Other | Admitting: Physician Assistant

## 2023-02-11 VITALS — BP 120/70 | HR 69 | Temp 98.0°F | Ht 68.0 in | Wt 201.0 lb

## 2023-02-11 DIAGNOSIS — F329 Major depressive disorder, single episode, unspecified: Secondary | ICD-10-CM | POA: Diagnosis not present

## 2023-02-11 MED ORDER — SERTRALINE HCL 25 MG PO TABS: 25.0000 mg | ORAL_TABLET | Freq: Every day | ORAL | 1 refills | Status: DC

## 2023-02-11 NOTE — Progress Notes (Signed)
Taylor Berger is a 77 y.o. male here for a follow-up of a pre-existing problem.  History of Present Illness:   Chief Complaint  Patient presents with   Depression    Pt c/o depression x 2 weeks   Insomnia   HPI  Depression: Complains of depression for the past 2 weeks. Reports the recent political climate is contributing to his depression. Previous managed with Zoloft years ago - notes he tolerated it. Denies SI/HI.  Insomnia: Managed with 50 mg Trazadone.  Reports mild efficacy.  Notes he hasn't been taking it recently because he's been taking Nyquil to manage his allergies.   Past Medical History:  Diagnosis Date   Allergy    Seasonal   Aortic aneurysm (HCC)    Depression    DVT (deep venous thrombosis) (HCC) 2020   GERD (gastroesophageal reflux disease)    Goiter    Hyperlipidemia    Hypertension    Lymphoma (HCC) 2020   Follicular, stomach   Neuropathy of both feet     Social History   Tobacco Use   Smoking status: Never   Smokeless tobacco: Never  Vaping Use   Vaping status: Never Used  Substance Use Topics   Alcohol use: Not Currently   Drug use: Never   Past Surgical History:  Procedure Laterality Date   CARPAL TUNNEL RELEASE Left    CERVICAL DISCECTOMY  1995   C-7   CERVICAL FUSION  01/1989   C-3 through C-6 / no metal   COLONOSCOPY     ELBOW SURGERY Left    FOOT SURGERY Left 1999   Plantar fascitis   low back fusion  2013   Lumbar & Low Thoracic with metal   SHOULDER ARTHROSCOPY WITH ROTATOR CUFF REPAIR Right 2014   UPPER GASTROINTESTINAL ENDOSCOPY     Family History  Problem Relation Age of Onset   Diabetes Mother    Hypertension Mother    Hyperlipidemia Mother    Heart failure Mother    Leukemia Father    Stroke Father    Esophageal cancer Neg Hx    Colon polyps Neg Hx    Pancreatic cancer Neg Hx    Stomach cancer Neg Hx    Rectal cancer Neg Hx    No Known Allergies Current Medications:   Current Outpatient Medications:     amLODipine (NORVASC) 10 MG tablet, Take 1 tablet by mouth once daily, Disp: 90 tablet, Rfl: 0   atorvastatin (LIPITOR) 80 MG tablet, Take 1 tablet by mouth once daily, Disp: 90 tablet, Rfl: 0   cetirizine (ZYRTEC) 10 MG tablet, Take 10 mg by mouth daily., Disp: , Rfl:    clindamycin (CLEOCIN-T) 1 % external solution, Apply topically 2 (two) times daily as needed., Disp: 30 mL, Rfl: 1   esomeprazole (NEXIUM) 20 MG capsule, Take 20 mg by mouth daily at 12 noon., Disp: , Rfl:    FIBER ADULT GUMMIES PO, Take 2 each by mouth daily in the afternoon., Disp: , Rfl:    fluticasone (FLONASE) 50 MCG/ACT nasal spray, Place into both nostrils as needed for allergies or rhinitis., Disp: , Rfl:    lisinopril (ZESTRIL) 5 MG tablet, Take 1 tablet by mouth once daily, Disp: 90 tablet, Rfl: 0   Probiotic Product (PROBIOTIC GUMMIES PO), Take 3 each by mouth daily in the afternoon., Disp: , Rfl:    rivaroxaban (XARELTO) 10 MG TABS tablet, Take 1 tablet (10 mg total) by mouth daily., Disp: 90 tablet, Rfl: 2  sertraline (ZOLOFT) 25 MG tablet, Take 1 tablet (25 mg total) by mouth daily., Disp: 30 tablet, Rfl: 1   valACYclovir (VALTREX) 500 MG tablet, Take 1 tablet (500 mg total) by mouth daily., Disp: 90 tablet, Rfl: 1   traZODone (DESYREL) 50 MG tablet, Take 1 tablet (50 mg total) by mouth at bedtime as needed for sleep. (Patient not taking: Reported on 02/11/2023), Disp: 30 tablet, Rfl: 1  Review of Systems:   ROS See pertinent positives and negatives as per the HPI.  Vitals:   Vitals:   02/11/23 1400  BP: 120/70  Pulse: 69  Temp: 98 F (36.7 C)  TempSrc: Temporal  SpO2: 94%  Weight: 201 lb (91.2 kg)  Height: 5\' 8"  (1.727 m)     Body mass index is 30.56 kg/m.  Physical Exam:   Physical Exam Vitals and nursing note reviewed.  Constitutional:      Appearance: He is well-developed.  HENT:     Head: Normocephalic.  Eyes:     Conjunctiva/sclera: Conjunctivae normal.     Pupils: Pupils are equal,  round, and reactive to light.  Pulmonary:     Effort: Pulmonary effort is normal.  Musculoskeletal:        General: Normal range of motion.     Cervical back: Normal range of motion.  Skin:    General: Skin is warm and dry.  Neurological:     Mental Status: He is alert and oriented to person, place, and time.  Psychiatric:        Behavior: Behavior normal.        Thought Content: Thought content normal.        Judgment: Judgment normal.     Assessment and Plan:   Reactive depression Uncontrolled Start Zoloft 25 mg daily Denies suicidal ideation/hi I discussed with patient that if they develop any SI, to tell someone immediately and seek medical attention. Follow-up in 1 month, sooner if concerns  Insomnia Ongoing  Continue trazodone as needed if desired I suspect Zoloft will help with sleep as well once it gets his anxiety under better control   I,Emily Lagle,acting as a scribe for [employer], PA.,have documented all relevant documentation on the behalf of Jarold Motto, PA,as directed by  Jarold Motto, PA while in the presence of Jarold Motto, [place].  I, Jarold Motto, [place], have reviewed all documentation for this visit. The documentation on 02/11/23 for the exam, diagnosis, procedures, and orders are all accurate and complete.  Jarold Motto, PA-C

## 2023-02-11 NOTE — Patient Instructions (Signed)
It was great to see you!  Start Zoloft 25 mg daily  Follow-up in 6 months, sooner if concerns  Take care,  Jarold Motto PA-C

## 2023-02-25 ENCOUNTER — Other Ambulatory Visit: Payer: Self-pay | Admitting: Physician Assistant

## 2023-02-27 ENCOUNTER — Encounter: Payer: Self-pay | Admitting: Internal Medicine

## 2023-02-27 ENCOUNTER — Ambulatory Visit: Payer: Medicare Other | Attending: Internal Medicine | Admitting: Internal Medicine

## 2023-02-27 VITALS — BP 96/64 | HR 57 | Ht 68.0 in | Wt 202.0 lb

## 2023-02-27 DIAGNOSIS — I82409 Acute embolism and thrombosis of unspecified deep veins of unspecified lower extremity: Secondary | ICD-10-CM | POA: Insufficient documentation

## 2023-02-27 DIAGNOSIS — R072 Precordial pain: Secondary | ICD-10-CM | POA: Diagnosis present

## 2023-02-27 DIAGNOSIS — I10 Essential (primary) hypertension: Secondary | ICD-10-CM | POA: Insufficient documentation

## 2023-02-27 DIAGNOSIS — I2510 Atherosclerotic heart disease of native coronary artery without angina pectoris: Secondary | ICD-10-CM | POA: Insufficient documentation

## 2023-02-27 DIAGNOSIS — E785 Hyperlipidemia, unspecified: Secondary | ICD-10-CM | POA: Diagnosis present

## 2023-02-27 NOTE — Progress Notes (Signed)
Cardiology Office Note:  .   Date:  02/27/2023  ID:  Taylor Berger, DOB 01-14-1946, MRN [id number] PCP: Jarold Motto, PA  [facility] Providers Cardiologist:  Parke Poisson, MD    History of Present Illness: .   Taylor Berger is a 77 y.o. male.  Discussed the use of AI scribe software for clinical note transcription with the patient, who gave verbal consent to proceed.  History of Present Illness   The patient, with a history of carpal tunnel, ulnar nerve issues, DVT, and lymphoma, is planning to undergo carpal tunnel and ulnar nerve surgery. The patient had a similar surgery on the left side in 2016. The patient has been experiencing fatigue and shortness of breath, which he attributes to irregular sleep patterns and lack of physical activity. The patient also reports swelling in the ankles, particularly on the side where he previously had DVTs. The patient is currently on amlodipine, atorvastatin, lisinopril, and Xarelto for heart-related issues.        ROS: negative except per HPI above.  Studies Reviewed: Marland Kitchen   EKG Interpretation Date/Time:  Wednesday February 27 2023 13:54:21 EST Ventricular Rate:  57 PR Interval:  232 QRS Duration:  108 QT Interval:  410 QTC Calculation: 399 R Axis:   -44  Text Interpretation: Sinus bradycardia with 1st degree A-V block Left axis deviation Minimal voltage criteria for LVH, may be normal variant ( Cornell product ) Confirmed by Weston Brass ([id number]) on 02/27/2023 2:43:46 PM    Results   RADIOLOGY Coronary CT: Moderate stenosis in a critical coronary artery, no significant obstruction (2022)  DIAGNOSTIC Echocardiogram: Normal findings (2022)     Risk Assessment/Calculations:             Physical Exam:   VS:  BP 96/64   Pulse (!) 57   Ht 5\' 8"  (1.727 m)   Wt 202 lb (91.6 kg)   SpO2 96%   BMI 30.71 kg/m    Wt Readings from Last 3 Encounters:  02/27/23 202 lb (91.6 kg)  02/11/23 201 lb (91.2 kg)  12/14/22 206 lb  6.4 oz (93.6 kg)     Physical Exam   VITALS: P- 57, BP- low CHEST: Lungs clear to auscultation. CARDIOVASCULAR: Heart sounds normal. EXTREMITIES: Asymmetrical swelling of ankles, greater on R     GEN: Well nourished, well developed in no acute distress NECK: No JVD; No carotid bruits CARDIAC: RRR, no murmurs, rubs, gallops RESPIRATORY:  Clear to auscultation without rales, wheezing or rhonchi  ABDOMEN: Soft, non-tender, non-distended EXTREMITIES:  No edema; No deformity   ASSESSMENT AND PLAN: .    1. Primary hypertension   2. Precordial pain   3. Hyperlipidemia LDL goal <70   4. Recurrent deep vein thrombosis (DVT) (HCC)   5. Coronary artery disease involving native coronary artery of native heart without angina pectoris   6. Hypertension, unspecified type     Assessment and Plan    Coronary Artery Disease Moderate blockages noted on previous coronary CT. Patient reports increased fatigue and shortness of breath, potentially due to decreased physical activity and sleep disturbances. -Schedule repeat coronary CT to assess for progression of disease. -Continue current medications:Amlodipine 10mg  daily, Atorvastatin 80mg  daily, Lisinopril 5mg  daily, and Xarelto 10mg  daily.  Deep Vein Thrombosis (DVT) History of DVT, currently on lifelong Xarelto. -Continue Xarelto 10mg  daily.  Lymphoma Under the care of Dr. Leonides Schanz. -Continue follow-up with Dr. Leonides Schanz as scheduled.  Hypertension Blood pressure noted to be low during the visit,  which is atypical for the patient. -Monitor blood pressure and adjust medications as needed.  Carpal Tunnel and Ulnar Nerve Compression Planning for surgery in the new year. -Preoperative assessment pending results of coronary CT.  Peripheral Neuropathy Reports difficulty with proprioception, particularly at night. -Continue current management strategies, including use of assistive devices and safety measures at home.  General Health  Maintenance -Encourage increased physical activity and hydration. -Schedule follow-up visit in 6 months or sooner if coronary CT results warrant earlier discussion.            Total time of encounter: 30 minutes total time of encounter, including 20 minutes spent in face-to-face patient care on the date of this encounter. This time includes coordination of care and counseling regarding above mentioned problem list. Remainder of non-face-to-face time involved reviewing chart documents/testing relevant to the patient encounter and documentation in the medical record. I have independently reviewed documentation from referring provider.   Weston Brass, MD, [facility] [place]  [facility]

## 2023-02-27 NOTE — Patient Instructions (Addendum)
Medication Instructions:  Your physician recommends that you continue on your current medications as directed. Please refer to the Current Medication list given to you today.  *If you need a refill on your cardiac medications before your next appointment, please call your pharmacy*   Lab Work: Your physician recommends that you have labs drawn today: BMET  If you have labs (blood work) drawn today and your tests are completely normal, you will receive your results only by: MyChart Message (if you have MyChart) OR A paper copy in the mail If you have any lab test that is abnormal or we need to change your treatment, we will call you to review the results.   Testing/Procedures: See below   Follow-Up: At [facility], you and your health needs are our priority.  As part of our continuing mission to provide you with exceptional heart care, we have created designated Provider Care Teams.  These Care Teams include your primary Cardiologist (physician) and Advanced Practice Providers (APPs -  Physician Assistants and Nurse Practitioners) who all work together to provide you with the care you need, when you need it.  We recommend signing up for the patient portal called "MyChart".  Sign up information is provided on this After Visit Summary.  MyChart is used to connect with patients for Virtual Visits (Telemedicine).  Patients are able to view lab/test results, encounter notes, upcoming appointments, etc.  Non-urgent messages can be sent to your provider as well.   To learn more about what you can do with MyChart, go to [email or web address].    Your next appointment:   6 month(s)  Provider:   Marjie Skiff, PA-C, Azalee Course, PA-C or Parke Poisson, MD    Other Instructions   Your cardiac CT will be scheduled at the below location:   [facility] [address] [place], [place] [address] [phone]  If scheduled at [facility], please arrive at  the [facility] and Children's Entrance (Entrance C2) of [facility] 30 minutes prior to test start time. You can use the FREE valet parking offered at entrance C (encouraged to control the heart rate for the test)  Proceed to the [facility] Radiology Department (first floor) to check-in and test prep.  All radiology patients and guests should use entrance C2 at [facility], accessed from [facility], even though the hospital's physical address listed is [address]     Please follow these instructions carefully (unless otherwise directed):  An IV will be required for this test and Nitroglycerin will be given.   On the Night Before the Test: Be sure to Drink plenty of water. Do not consume any caffeinated/decaffeinated beverages or chocolate 12 hours prior to your test. Do not take any antihistamines 12 hours prior to your test.  On the Day of the Test: Drink plenty of water until 1 hour prior to the test. Do not eat any food 1 hour prior to test. You may take your regular medications prior to the test.        After the Test: Drink plenty of water. After receiving IV contrast, you may experience a mild flushed feeling. This is normal. On occasion, you may experience a mild rash up to 24 hours after the test. This is not dangerous. If this occurs, you can take Benadryl 25 mg and increase your fluid intake. If you experience trouble breathing, this can be serious. If it is severe call 911 IMMEDIATELY.  If it is mild, please call our office. If you take any of these medications: Glipizide/Metformin, Avandament, Glucavance, please do not take 48 hours after completing test unless otherwise instructed.  We will call to schedule your test 2-4 weeks out understanding that some insurance companies will need an authorization prior to the service being performed.   For more information and frequently asked questions, please visit our website :  [email or web address]  For non-scheduling related questions, please contact the cardiac imaging nurse navigator should you have any questions/concerns: Cardiac Imaging Nurse Navigators Direct Office Dial: [phone]   For scheduling needs, including cancellations and rescheduling, please call [place], [phone].

## 2023-02-28 LAB — BASIC METABOLIC PANEL
BUN/Creatinine Ratio: 16 (ref 10–24)
BUN: 20 mg/dL (ref 8–27)
CO2: 21 mmol/L (ref 20–29)
Calcium: 10.4 mg/dL — ABNORMAL HIGH (ref 8.6–10.2)
Chloride: 105 mmol/L (ref 96–106)
Creatinine, Ser: 1.22 mg/dL (ref 0.76–1.27)
Glucose: 107 mg/dL — ABNORMAL HIGH (ref 70–99)
Potassium: 4.6 mmol/L (ref 3.5–5.2)
Sodium: 143 mmol/L (ref 134–144)
eGFR: 61 mL/min/{1.73_m2} (ref >59)

## 2023-03-19 ENCOUNTER — Telehealth (HOSPITAL_COMMUNITY): Payer: Self-pay | Admitting: *Deleted

## 2023-03-19 NOTE — Telephone Encounter (Signed)
Attempted to call patient regarding upcoming cardiac CT appointment. Left message on voicemail with name and callback number Hayley Sharpe RN Navigator Cardiac Imaging [facility] [phone] Office   

## 2023-03-20 ENCOUNTER — Ambulatory Visit (HOSPITAL_COMMUNITY)
Admission: RE | Admit: 2023-03-20 | Discharge: 2023-03-20 | Disposition: A | Discharge status: Home / Self Care | Payer: Medicare Other | Source: Ambulatory Visit | Attending: Internal Medicine | Admitting: Internal Medicine

## 2023-03-20 DIAGNOSIS — R072 Precordial pain: Secondary | ICD-10-CM | POA: Insufficient documentation

## 2023-03-20 MED ORDER — NITROGLYCERIN 0.4 MG SL SUBL
SUBLINGUAL_TABLET | SUBLINGUAL | Status: AC
  Filled 2023-03-20: qty 2

## 2023-03-20 MED ORDER — IOHEXOL 350 MG/ML SOLN
95.0000 mL | Freq: Once | INTRAVENOUS | Status: AC | PRN
  Administered 2023-03-20: 95 mL via INTRAVENOUS

## 2023-03-20 MED ORDER — NITROGLYCERIN 0.4 MG SL SUBL
0.8000 mg | SUBLINGUAL_TABLET | Freq: Once | SUBLINGUAL | Status: AC
  Administered 2023-03-20: 0.8 mg via SUBLINGUAL

## 2023-03-29 ENCOUNTER — Telehealth: Payer: Self-pay | Admitting: *Deleted

## 2023-03-29 NOTE — Telephone Encounter (Signed)
Attempted to call all three numbers, left message on [facility]) Mobile [phone] (ok per DPR).  Need to relay results of Cardiac CT to pt and either prescribe Ezetimibe or refer to Lipid Clinic.   Per Dr. Jacques Navy (result note from 03/21/2023): There is plaque in the arteries but no tight blockages. This means nothing worrisome currently but we need to reduce your risk of a heart event by addressing the cholesterol plaque further. We should consider getting your LDL down to less than 55. At this point I would add ezetimibe 10 mg daily to try to drive cholesterol down further and repeat cholesterol labs in 3 mo if we start med. If he'd prefer, he can discuss options for lower cholesterol further with our pharmacist if questions, set up for CVRR lipid clinic if patient wants to discuss before adding new medications.

## 2023-04-04 ENCOUNTER — Telehealth: Payer: Self-pay | Admitting: Hematology and Oncology

## 2023-04-04 ENCOUNTER — Other Ambulatory Visit (HOSPITAL_COMMUNITY): Payer: Self-pay | Admitting: *Deleted

## 2023-04-04 DIAGNOSIS — Z79899 Other long term (current) drug therapy: Secondary | ICD-10-CM

## 2023-04-04 MED ORDER — EZETIMIBE 10 MG PO TABS: 10.0000 mg | ORAL_TABLET | Freq: Every day | ORAL | 3 refills | Status: DC

## 2023-04-04 NOTE — Telephone Encounter (Signed)
 Rescheduled patient per provider on call. Patient is aware of changes made to the appointments and will be mailed an appointment reminder.

## 2023-04-10 ENCOUNTER — Other Ambulatory Visit: Payer: Self-pay | Admitting: Physician Assistant

## 2023-05-02 ENCOUNTER — Ambulatory Visit: Payer: Medicare Other | Admitting: Physician Assistant

## 2023-05-07 ENCOUNTER — Encounter: Payer: Self-pay | Admitting: Physician Assistant

## 2023-05-07 ENCOUNTER — Ambulatory Visit (INDEPENDENT_AMBULATORY_CARE_PROVIDER_SITE_OTHER): Payer: Medicare Other | Admitting: Physician Assistant

## 2023-05-07 VITALS — BP 120/80 | HR 64 | Temp 98.4°F | Ht 68.0 in | Wt 202.4 lb

## 2023-05-07 DIAGNOSIS — E785 Hyperlipidemia, unspecified: Secondary | ICD-10-CM | POA: Diagnosis not present

## 2023-05-07 DIAGNOSIS — G4700 Insomnia, unspecified: Secondary | ICD-10-CM | POA: Diagnosis not present

## 2023-05-07 DIAGNOSIS — F329 Major depressive disorder, single episode, unspecified: Secondary | ICD-10-CM

## 2023-05-07 DIAGNOSIS — R7309 Other abnormal glucose: Secondary | ICD-10-CM

## 2023-05-07 LAB — LIPID PANEL
Cholesterol: 112 mg/dL (ref 0–200)
HDL: 37.9 mg/dL — ABNORMAL LOW (ref >39.00)
LDL Cholesterol: 59 mg/dL (ref 0–99)
NonHDL: 74.2
Total CHOL/HDL Ratio: 3
Triglycerides: 78 mg/dL (ref 0.0–149.0)
VLDL: 15.6 mg/dL (ref 0.0–40.0)

## 2023-05-07 LAB — CBC WITH DIFFERENTIAL/PLATELET
Basophils Absolute: 0.1 10*3/uL (ref 0.0–0.1)
Basophils Relative: 1 % (ref 0.0–3.0)
Eosinophils Absolute: 0.3 10*3/uL (ref 0.0–0.7)
Eosinophils Relative: 3.7 % (ref 0.0–5.0)
HCT: 42.1 % (ref 39.0–52.0)
Hemoglobin: 14 g/dL (ref 13.0–17.0)
Lymphocytes Relative: 19 % (ref 12.0–46.0)
Lymphs Abs: 1.5 10*3/uL (ref 0.7–4.0)
MCHC: 33.2 g/dL (ref 30.0–36.0)
MCV: 97.1 fL (ref 78.0–100.0)
Monocytes Absolute: 0.9 10*3/uL (ref 0.1–1.0)
Monocytes Relative: 11.7 % (ref 3.0–12.0)
Neutro Abs: 5.1 10*3/uL (ref 1.4–7.7)
Neutrophils Relative %: 64.6 % (ref 43.0–77.0)
Platelets: 255 10*3/uL (ref 150.0–400.0)
RBC: 4.34 Mil/uL (ref 4.22–5.81)
RDW: 13.6 % (ref 11.5–15.5)
WBC: 7.9 10*3/uL (ref 4.0–10.5)

## 2023-05-07 LAB — COMPREHENSIVE METABOLIC PANEL
ALT: 24 U/L (ref 0–53)
AST: 20 U/L (ref 0–37)
Albumin: 4.2 g/dL (ref 3.5–5.2)
Alkaline Phosphatase: 70 U/L (ref 39–117)
BUN: 17 mg/dL (ref 6–23)
CO2: 26 meq/L (ref 19–32)
Calcium: 9.6 mg/dL (ref 8.4–10.5)
Chloride: 106 meq/L (ref 96–112)
Creatinine, Ser: 1.01 mg/dL (ref 0.40–1.50)
GFR: 70.92 mL/min (ref >60.00)
Glucose, Bld: 99 mg/dL (ref 70–99)
Potassium: 3.7 meq/L (ref 3.5–5.1)
Sodium: 140 meq/L (ref 135–145)
Total Bilirubin: 1 mg/dL (ref 0.2–1.2)
Total Protein: 7 g/dL (ref 6.0–8.3)

## 2023-05-07 LAB — HEMOGLOBIN A1C: Hgb A1c MFr Bld: 6.1 % (ref 4.6–6.5)

## 2023-05-07 MED ORDER — SERTRALINE HCL 25 MG PO TABS
25.0000 mg | ORAL_TABLET | Freq: Every day | ORAL | 1 refills | Status: DC
Start: 2023-05-07 — End: 2023-12-16

## 2023-05-07 NOTE — Patient Instructions (Signed)
It was great to see you!  We will check blood work today  Continue to take the Zoloft 50 mg dialy  Take care,  Jarold Motto PA-C

## 2023-05-07 NOTE — Progress Notes (Signed)
 Taylor Berger is a 78 y.o. male here for a follow up of a pre-existing problem.  History of Present Illness:   Chief Complaint  Patient presents with   Depression   Depression  Reports compliance and good tolerance of Zoloft  25 mg daily requesting refill today.  Reports feeling better with medication. Denies any suicidal thoughts. Not interested in a dose increase at this time.  Typically well controlled at this time. No further complains.   Hyperlipidemia LDL  Reports starting new medication that eliminates LDL from stomach, can't recall the name.  Possibly Zetia  10 mg.   Continues on Lipitor 80 mg daily.  Insomnia  No longer taking Trazodone  50 mg.  He states that he's been staying up late. Has been attempting to correct his sleeping schedule to improve sleeping.  No further complaints at this time.  Elevated Glucose  Reports a history of elevated glucose levels.  Requesting to check his A1c today.  Denies hypoglycemic or hyperglycemic episodes or symptoms.   Past Medical History:  Diagnosis Date   Allergy    Seasonal   Aortic aneurysm (HCC)    Depression    DVT (deep venous thrombosis) (HCC) 2020   GERD (gastroesophageal reflux disease)    Goiter    Hyperlipidemia    Hypertension    Lymphoma (HCC) 2020   Follicular, stomach   Neuropathy of both feet      Social History   Tobacco Use   Smoking status: Never   Smokeless tobacco: Never  Vaping Use   Vaping status: Never Used  Substance Use Topics   Alcohol use: Not Currently   Drug use: Never    Past Surgical History:  Procedure Laterality Date   CARPAL TUNNEL RELEASE Left    CERVICAL DISCECTOMY  1995   C-7   CERVICAL FUSION  01/1989   C-3 through C-6 / no metal   COLONOSCOPY     ELBOW SURGERY Left    FOOT SURGERY Left 1999   Plantar fascitis   low back fusion  2013   Lumbar & Low Thoracic with metal   SHOULDER ARTHROSCOPY WITH ROTATOR CUFF REPAIR Right 2014   UPPER GASTROINTESTINAL ENDOSCOPY       Family History  Problem Relation Age of Onset   Diabetes Mother    Hypertension Mother    Hyperlipidemia Mother    Heart failure Mother    Leukemia Father    Stroke Father    Esophageal cancer Neg Hx    Colon polyps Neg Hx    Pancreatic cancer Neg Hx    Stomach cancer Neg Hx    Rectal cancer Neg Hx     No Known Allergies  Current Medications:   Current Outpatient Medications:    amLODipine  (NORVASC ) 10 MG tablet, Take 1 tablet by mouth once daily, Disp: 90 tablet, Rfl: 1   atorvastatin  (LIPITOR) 80 MG tablet, Take 1 tablet by mouth once daily, Disp: 90 tablet, Rfl: 0   cetirizine (ZYRTEC) 10 MG tablet, Take 10 mg by mouth daily., Disp: , Rfl:    clindamycin  (CLEOCIN -T) 1 % external solution, Apply topically 2 (two) times daily as needed., Disp: 30 mL, Rfl: 1   esomeprazole  (NEXIUM ) 20 MG capsule, Take 20 mg by mouth daily at 12 noon., Disp: , Rfl:    ezetimibe  (ZETIA ) 10 MG tablet, Take 1 tablet (10 mg total) by mouth daily., Disp: 90 tablet, Rfl: 3   FIBER ADULT GUMMIES PO, Take 2 each by mouth daily in the  afternoon., Disp: , Rfl:    fluticasone (FLONASE) 50 MCG/ACT nasal spray, Place into both nostrils as needed for allergies or rhinitis., Disp: , Rfl:    lisinopril  (ZESTRIL ) 5 MG tablet, Take 1 tablet by mouth once daily, Disp: 90 tablet, Rfl: 0   Probiotic Product (PROBIOTIC GUMMIES PO), Take 3 each by mouth daily in the afternoon., Disp: , Rfl:    rivaroxaban  (XARELTO ) 10 MG TABS tablet, Take 1 tablet (10 mg total) by mouth daily., Disp: 90 tablet, Rfl: 2   valACYclovir  (VALTREX ) 500 MG tablet, Take 1 tablet (500 mg total) by mouth daily., Disp: 90 tablet, Rfl: 1   sertraline  (ZOLOFT ) 25 MG tablet, Take 1 tablet (25 mg total) by mouth daily., Disp: 90 tablet, Rfl: 1   Review of Systems:   Review of Systems  Psychiatric/Behavioral:  Positive for depression. Negative for suicidal ideas.   Negative unless otherwise specified per HPI.  Vitals:   Vitals:   05/07/23  1118  BP: 120/80  Pulse: 64  Temp: 98.4 F (36.9 C)  TempSrc: Temporal  SpO2: 98%  Weight: 202 lb 6.1 oz (91.8 kg)  Height: 5' 8 (1.727 m)     Body mass index is 30.77 kg/m.  Physical Exam:   Physical Exam Vitals and nursing note reviewed.  Constitutional:      General: He is not in acute distress.    Appearance: He is well-developed. He is not ill-appearing or toxic-appearing.  Cardiovascular:     Rate and Rhythm: Normal rate and regular rhythm.     Pulses: Normal pulses.     Heart sounds: Normal heart sounds, S1 normal and S2 normal.  Pulmonary:     Effort: Pulmonary effort is normal.     Breath sounds: Normal breath sounds.  Skin:    General: Skin is warm and dry.  Neurological:     Mental Status: He is alert.     GCS: GCS eye subscore is 4. GCS verbal subscore is 5. GCS motor subscore is 6.  Psychiatric:        Speech: Speech normal.        Behavior: Behavior normal. Behavior is cooperative.     Assessment and Plan:   Reactive depression Well controlled Continue Zoloft  25 mg daily Follow-up in 6 month(s), sooner if concerns I discussed with patient that if they develop any SI, to tell someone immediately and seek medical attention.  Elevated glucose Update blood work and provide recommendations Continue healthy lifestyle efforts  Hyperlipidemia, unspecified hyperlipidemia type Update lipid panel and provide recommendations however already on max statin (atorvastatin ) 80 mg daily and zetia  10 mg daily  Insomnia, unspecified type Ongoing Continue efforts to work on sleep  Declines further medications at this time Follow-up as needed   Taylor Buttner, PA-C  I,Taylor Berger,acting as a scribe for [employer], PA.,have documented all relevant documentation on the behalf of Taylor Buttner, PA,as directed by  Taylor Buttner, PA while in the presence of Taylor Berger, Taylor Berger.   I, Taylor Berger, Taylor Berger, have reviewed all documentation for this visit.  The documentation on 05/07/23 for the exam, diagnosis, procedures, and orders are all accurate and complete.

## 2023-05-08 ENCOUNTER — Encounter: Payer: Self-pay | Admitting: Physician Assistant

## 2023-05-29 ENCOUNTER — Encounter: Payer: Self-pay | Admitting: Physician Assistant

## 2023-05-30 ENCOUNTER — Other Ambulatory Visit: Payer: Self-pay | Admitting: *Deleted

## 2023-05-30 MED ORDER — RIVAROXABAN 10 MG PO TABS: 10.0000 mg | ORAL_TABLET | Freq: Every day | ORAL | 3 refills | Status: DC

## 2023-05-30 MED ORDER — VALACYCLOVIR HCL 500 MG PO TABS: 500.0000 mg | ORAL_TABLET | Freq: Every day | ORAL | 3 refills | Status: DC

## 2023-06-07 ENCOUNTER — Telehealth: Payer: Self-pay | Admitting: *Deleted

## 2023-06-07 NOTE — Telephone Encounter (Signed)
   Pre-operative Risk Assessment    Patient Name: Taylor Berger  DOB: [birth year] MRN: [id number]   Date of last office visit: 02/27/23 DR. ACHARYA Date of next office visit: NONE   Request for Surgical Clearance    Procedure:   RIGHT CARPAL TUNNEL RELEASE, RIGHT CUBITAL TUNNEL RELEASE WITH POSSIBLE ANTERIOR TRANSPOSITION  Date of Surgery:  Clearance TBD                                Surgeon:  DR. CHARLES BENFIELD Surgeon's Group or Practice Name:  [facility] Phone number:  [phone] ATTN: KERRI MAZE Fax number:  [phone]   Type of Clearance Requested:   - Medical  - Pharmacy:  Hold Rivaroxaban (Xarelto)     Type of Anesthesia:  MAC WITH REGIONAL   Additional requests/questions:    Elpidio Anis   06/07/2023, 5:38 PM

## 2023-06-10 ENCOUNTER — Encounter: Payer: Self-pay | Admitting: Internal Medicine

## 2023-06-11 ENCOUNTER — Telehealth: Payer: Self-pay | Admitting: *Deleted

## 2023-06-11 NOTE — Telephone Encounter (Signed)
 Pt has been scheduled for a tele visit 06/14/23 10:40.  Consent on file / medications reconciled.

## 2023-06-11 NOTE — Telephone Encounter (Signed)
   Name: Taylor Berger  DOB: [birth year]  MRN: [id number]  Primary Cardiologist: Parke Poisson, MD  Preoperative team, please contact this patient and set up a phone call appointment for further preoperative risk assessment. Please obtain consent and complete medication review. Thank you for your help. (I will remove from Preop Pool - please route back to P CV DIV PREOP if needed).  I confirm that guidance regarding antiplatelet and oral anticoagulation therapy has been completed and, if necessary, noted below. -Xarelto is managed by PCP for hx of DVT. Please notify requesting provider that recommendations for holding Xarelto should come from PCP.  I also confirmed the patient resides in the state of [place]. As per [facility] Medical Board telemedicine laws, the patient must reside in the state in which the provider is licensed. -[address] [place], PA-C 06/11/2023, 10:43 AM [facility]

## 2023-06-11 NOTE — Telephone Encounter (Signed)
 1st attempt to reach pt regarding surgical clearance and the need for a tele visit.  Left a message for pt to call back.

## 2023-06-11 NOTE — Telephone Encounter (Signed)
 Pt has been scheduled for a tele visit 06/14/23 10:40.  Consent on file / medications reconciled.    Patient Consent for Virtual Visit        Taylor Berger has provided verbal consent on 06/11/2023 for a virtual visit (video or telephone).   CONSENT FOR VIRTUAL VISIT FOR:  Taylor Berger  By participating in this virtual visit I agree to the following:  I hereby voluntarily request, consent and authorize [facility] and its employed or contracted physicians, physician assistants, nurse practitioners or other licensed health care professionals (the Practitioner), to provide me with telemedicine health care services (the "Services") as deemed necessary by the treating Practitioner. I acknowledge and consent to receive the Services by the Practitioner via telemedicine. I understand that the telemedicine visit will involve communicating with the Practitioner through live audiovisual communication technology and the disclosure of certain medical information by electronic transmission. I acknowledge that I have been given the opportunity to request an in-person assessment or other available alternative prior to the telemedicine visit and am voluntarily participating in the telemedicine visit.  I understand that I have the right to withhold or withdraw my consent to the use of telemedicine in the course of my care at any time, without affecting my right to future care or treatment, and that the Practitioner or I may terminate the telemedicine visit at any time. I understand that I have the right to inspect all information obtained and/or recorded in the course of the telemedicine visit and may receive copies of available information for a reasonable fee.  I understand that some of the potential risks of receiving the Services via telemedicine include:  Delay or interruption in medical evaluation due to technological equipment failure or disruption; Information transmitted may not be sufficient (e.g.  poor resolution of images) to allow for appropriate medical decision making by the Practitioner; and/or  In rare instances, security protocols could fail, causing a breach of personal health information.  Furthermore, I acknowledge that it is my responsibility to provide information about my medical history, conditions and care that is complete and accurate to the best of my ability. I acknowledge that Practitioner's advice, recommendations, and/or decision may be based on factors not within their control, such as incomplete or inaccurate data provided by me or distortions of diagnostic images or specimens that may result from electronic transmissions. I understand that the practice of medicine is not an exact science and that Practitioner makes no warranties or guarantees regarding treatment outcomes. I acknowledge that a copy of this consent can be made available to me via my patient portal [facility] MyChart), or I can request a printed copy by calling the office of [facility].    I understand that my insurance will be billed for this visit.   I have read or had this consent read to me. I understand the contents of this consent, which adequately explains the benefits and risks of the Services being provided via telemedicine.  I have been provided ample opportunity to ask questions regarding this consent and the Services and have had my questions answered to my satisfaction. I give my informed consent for the services to be provided through the use of telemedicine in my medical care

## 2023-06-12 IMAGING — DX DG CERVICAL SPINE COMPLETE 4+V
5 series · 5 of 5 positions shown · non-contrast
Comparison: None.

CLINICAL DATA: Chronic neck pain with left arm weakness. History of
cervical fusion.

EXAM:
CERVICAL SPINE - COMPLETE 4+ VIEW

[c-spine lat]
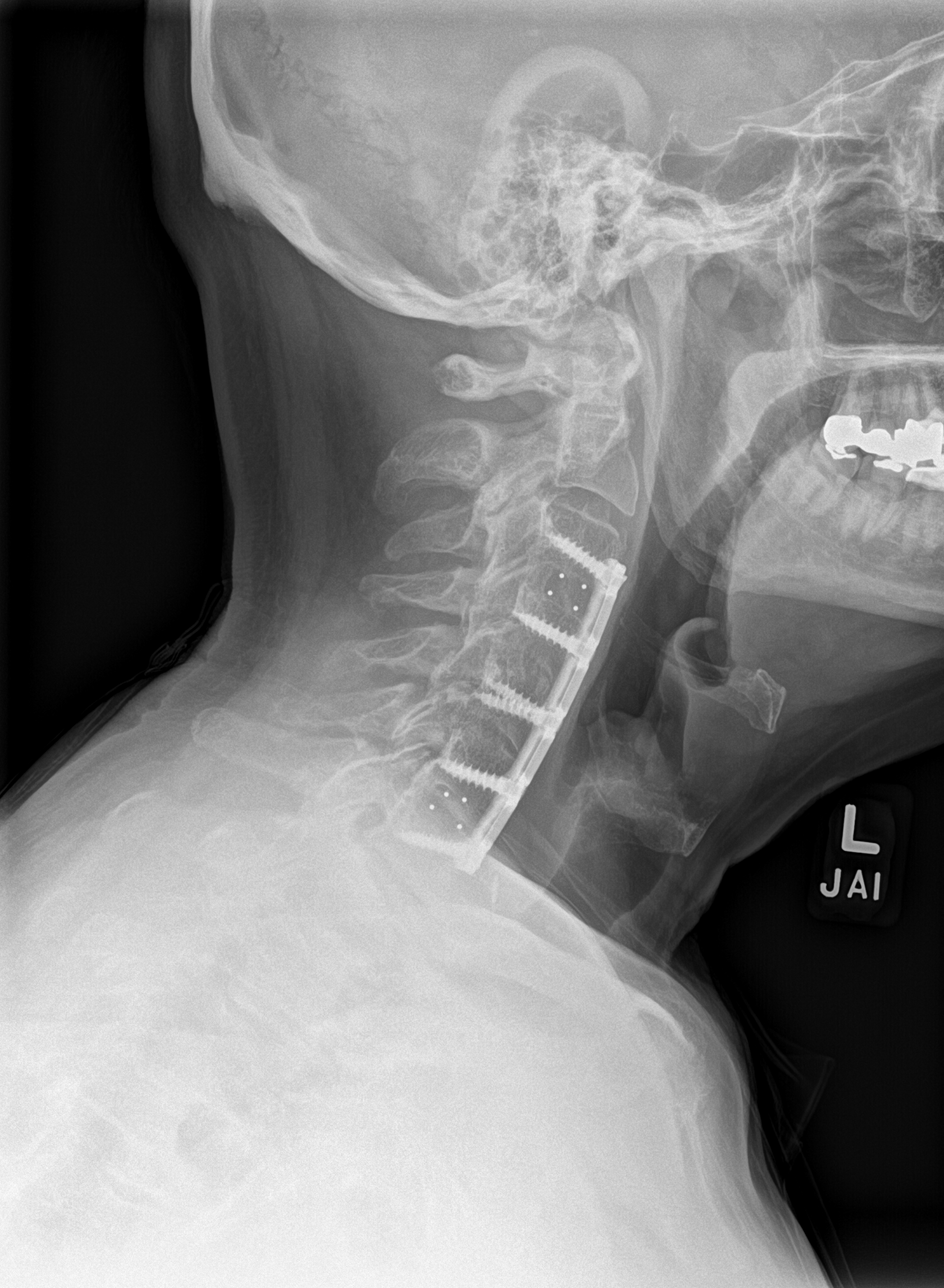

[c-spine obl (1 of 2)]
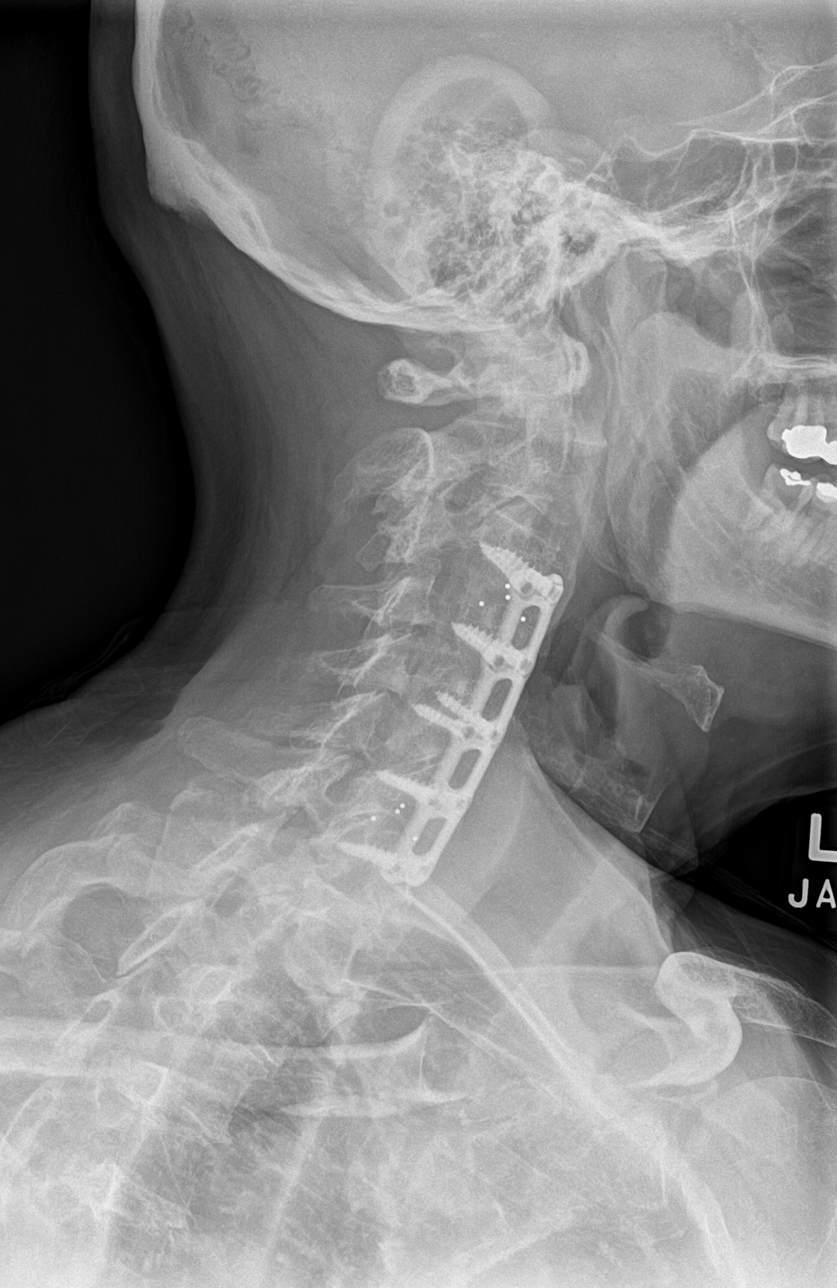

[c-spine obl (2 of 2)]
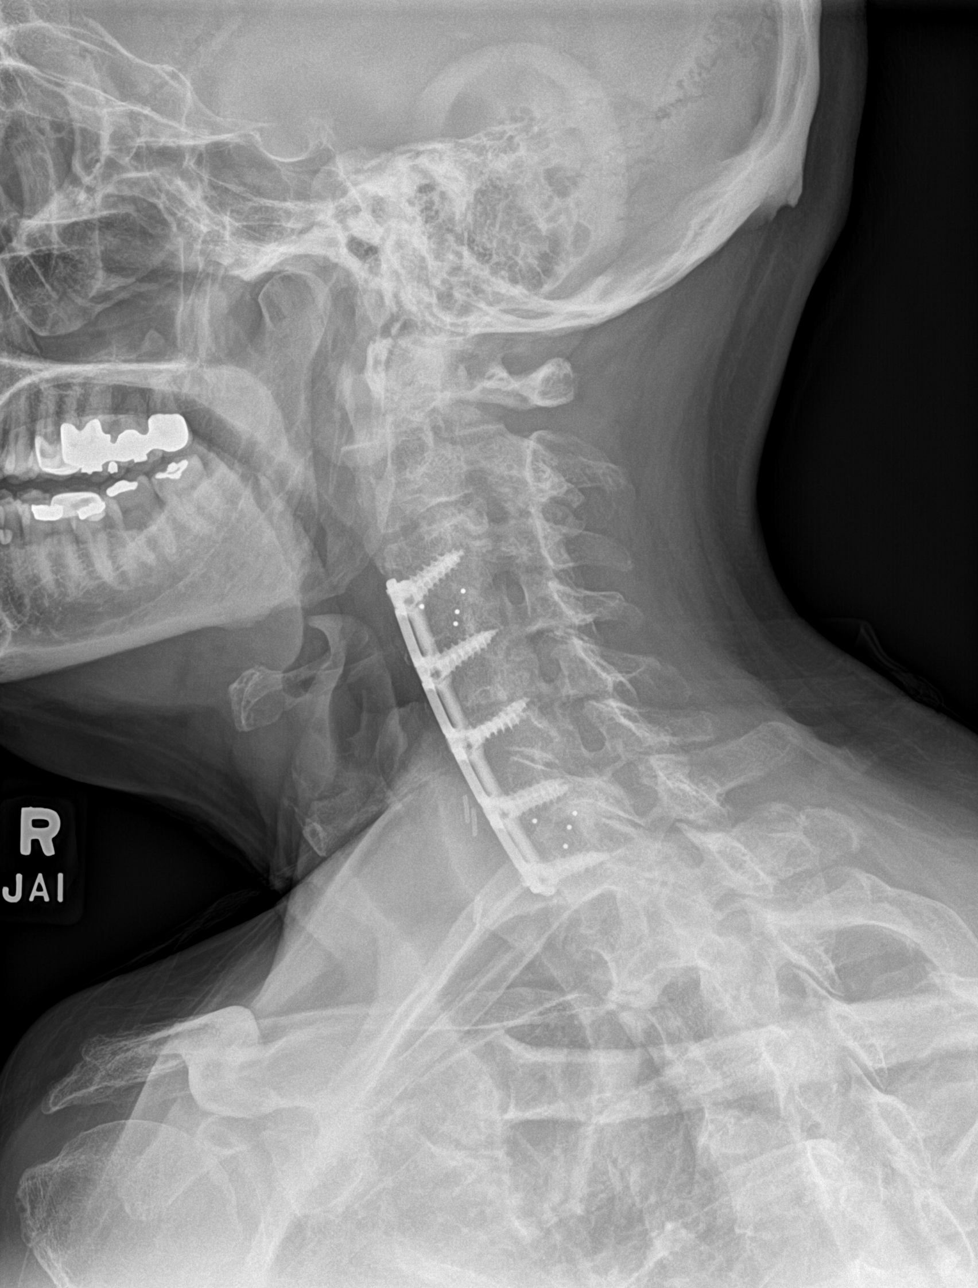

[c-spine ap]
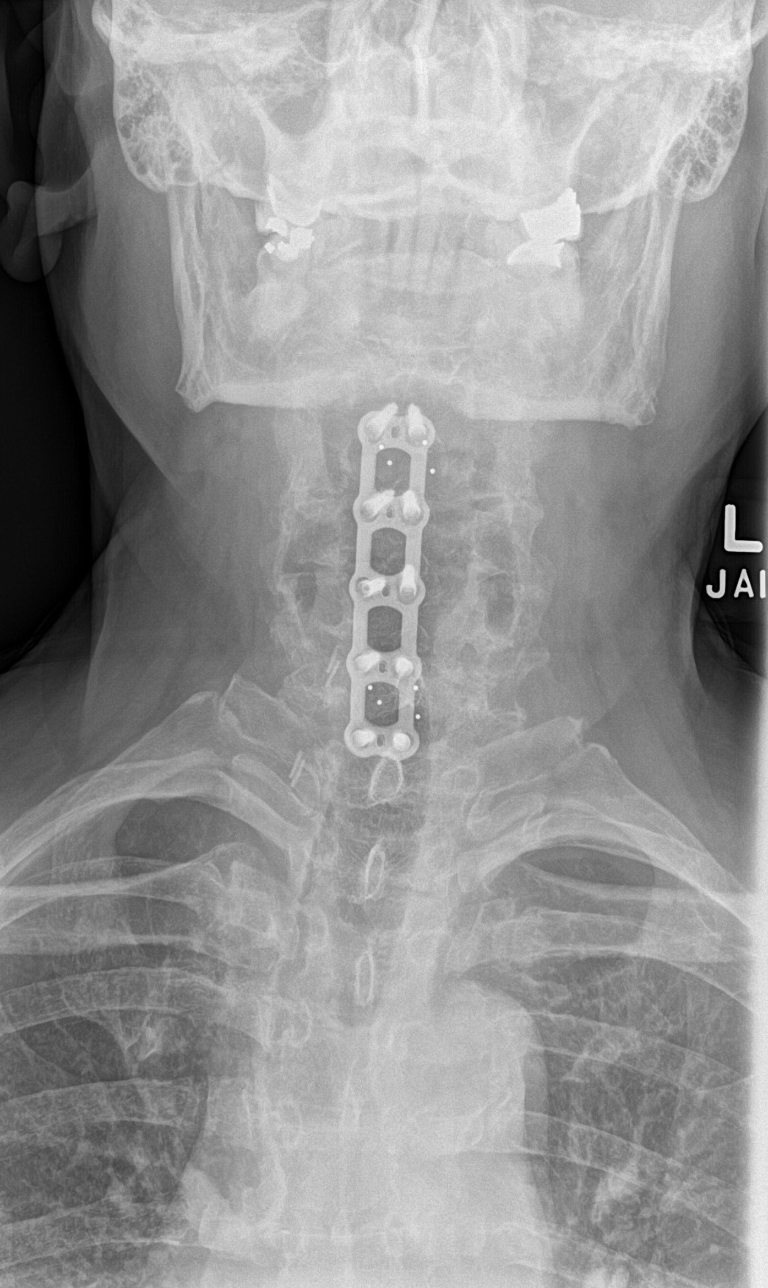

[c-spine open mouth]
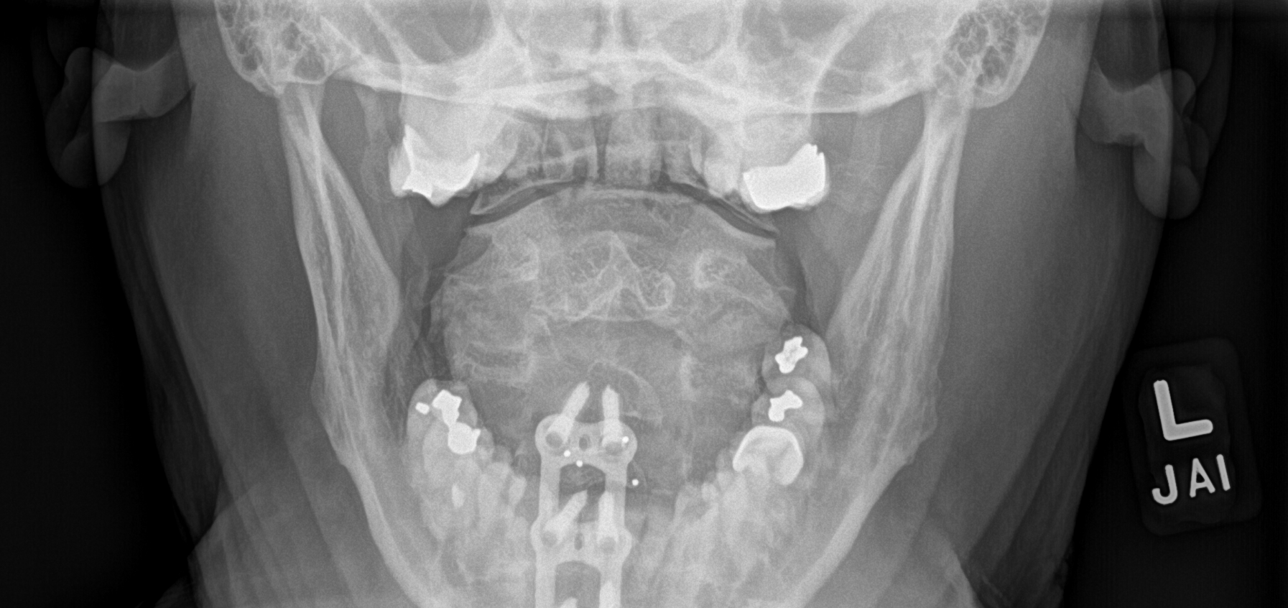

[5 of 5 positions shown; findings below may reference images not displayed]

FINDINGS: Postsurgical straightening of normal lordosis with anterior fusion
C3 through C7. Interbody spacers at C3-C4 and C6-C7. Intact hardware
without periprosthetic lucency. There is 2 mm anterolisthesis of C2
on C3. There is multilevel facet hypertrophy. Bony neural foraminal
stenosis appears more prominent on the left, at C4-C5, C5-C6, and
C6-C7. no fracture, evidence of focal bone lesion or bony
destruction. No prevertebral soft tissue edema.
IMPRESSION: 1. Postsurgical straightening of normal lordosis with anterior
fusion C3 through C7. No hardware complication.
2. Multilevel facet hypertrophy. Bony neural foraminal stenosis
appears more prominent on the left, at C4-C5, C5-C6, and C6-C7.

## 2023-06-13 ENCOUNTER — Other Ambulatory Visit: Payer: Medicare Other

## 2023-06-13 ENCOUNTER — Ambulatory Visit: Payer: Medicare Other | Admitting: Hematology and Oncology

## 2023-06-14 ENCOUNTER — Encounter: Payer: Self-pay | Admitting: Physician Assistant

## 2023-06-14 ENCOUNTER — Ambulatory Visit: Attending: Cardiovascular Disease

## 2023-06-14 DIAGNOSIS — Z01810 Encounter for preprocedural cardiovascular examination: Secondary | ICD-10-CM | POA: Diagnosis not present

## 2023-06-14 NOTE — Progress Notes (Signed)
 Virtual Visit via Telephone Note   Because of Taylor Berger co-morbid illnesses, he is at least at moderate risk for complications without adequate follow up.  This format is felt to be most appropriate for this patient at this time.  Due to technical limitations with video connection (technology), today's appointment will be conducted as an audio only telehealth visit, and Taylor Berger verbally agreed to proceed in this manner.   All issues noted in this document were discussed and addressed.  No physical exam could be performed with this format.  Evaluation Performed:  Preoperative cardiovascular risk assessment _____________   Date:  06/14/2023   Patient ID:  Taylor Berger, DOB April 01, 1946, MRN [id number] Patient Location:  Home Provider location:   Office  Primary Care Provider:  Jarold Motto, [place] Primary Cardiologist:  Parke Poisson, MD  Chief Complaint / Patient Profile   78 y.o. y/o male with a h/o hypertension, thoracic aortic aneurysm, who is pending right carpal tunnel release, right cubital tunnel release and presents today for telephonic preoperative cardiovascular risk assessment.  History of Present Illness    Taylor Berger is a 78 y.o. male who presents via audio/video conferencing for a telehealth visit today.  Pt was last seen in cardiology clinic on 02/27/2023 by Dr. Jacques Navy.  At that time Taylor Berger was doing well .  The patient is now pending procedure as outlined above. Since his last visit, he remains stable from a cardiac standpoint.  Today he denies chest pain, shortness of breath, lower extremity edema, fatigue, palpitations, melena, hematuria, hemoptysis, diaphoresis, weakness, presyncope, syncope, orthopnea, and PND.   Past Medical History    Past Medical History:  Diagnosis Date   Allergy    Seasonal   Aortic aneurysm (HCC)    Depression    DVT (deep venous thrombosis) (HCC) 2020   GERD (gastroesophageal reflux disease)    Goiter     Hyperlipidemia    Hypertension    Lymphoma (HCC) 2020   Follicular, stomach   Neuropathy of both feet    Past Surgical History:  Procedure Laterality Date   CARPAL TUNNEL RELEASE Left    CERVICAL DISCECTOMY  1995   C-7   CERVICAL FUSION  01/1989   C-3 through C-6 / no metal   COLONOSCOPY     ELBOW SURGERY Left    FOOT SURGERY Left 1999   Plantar fascitis   low back fusion  2013   Lumbar & Low Thoracic with metal   SHOULDER ARTHROSCOPY WITH ROTATOR CUFF REPAIR Right 2014   UPPER GASTROINTESTINAL ENDOSCOPY      Allergies  No Known Allergies  Home Medications    Prior to Admission medications   Medication Sig Start Date End Date Taking? Authorizing Provider  amLODipine (NORVASC) 10 MG tablet Take 1 tablet by mouth once daily 04/10/23   Jarold Motto, PA  atorvastatin (LIPITOR) 80 MG tablet Take 1 tablet by mouth once daily 02/25/23   Jarold Motto, PA  cetirizine (ZYRTEC) 10 MG tablet Take 10 mg by mouth daily.    [provider]  clindamycin (CLEOCIN-T) 1 % external solution Apply topically 2 (two) times daily as needed. 02/26/22   Jarold Motto, PA  esomeprazole (NEXIUM) 20 MG capsule Take 20 mg by mouth daily at 12 noon.    [provider]  ezetimibe (ZETIA) 10 MG tablet Take 1 tablet (10 mg total) by mouth daily. 04/04/23 07/03/23  Parke Poisson, MD  FIBER ADULT GUMMIES PO Take 2 each by  mouth daily in the afternoon.    [provider]  fluticasone (FLONASE) 50 MCG/ACT nasal spray Place into both nostrils as needed for allergies or rhinitis.    [provider]  lisinopril (ZESTRIL) 5 MG tablet Take 1 tablet by mouth once daily 10/22/22   Jarold Motto, PA  Probiotic Product (PROBIOTIC GUMMIES PO) Take 3 each by mouth daily in the afternoon.    [provider]  rivaroxaban (XARELTO) 10 MG TABS tablet Take 1 tablet (10 mg total) by mouth daily. 05/30/23   Jarold Motto, PA  sertraline (ZOLOFT) 25 MG tablet Take 1 tablet  (25 mg total) by mouth daily. 05/07/23   Jarold Motto, PA  valACYclovir (VALTREX) 500 MG tablet Take 1 tablet (500 mg total) by mouth daily. 05/30/23   Jarold Motto, PA    Physical Exam    Vital Signs:  Lanny Lipkin does not have vital signs available for review today.  Given telephonic nature of communication, physical exam is limited. AAOx3. NAD. Normal affect.  Speech and respirations are unlabored.  Accessory Clinical Findings    None  Assessment & Plan    1.  Preoperative Cardiovascular Risk Assessment:Procedure:   RIGHT CARPAL TUNNEL RELEASE, RIGHT CUBITAL TUNNEL RELEASE WITH POSSIBLE ANTERIOR TRANSPOSITION   Date of Surgery:  Clearance TBD                                  Surgeon:  DR. CHARLES BENFIELD Surgeon's Group or Practice Name:  [facility] Phone number:  [phone] ATTN: KERRI MAZE Fax number:  [phone]      Primary Cardiologist: Parke Poisson, MD  Chart reviewed as part of pre-operative protocol coverage. Given past medical history and time since last visit, based on ACC/AHA guidelines, Jden Want would be at acceptable risk for the planned procedure without further cardiovascular testing.   His RCRI is very low risk, 0.4% risk of major cardiac event.  He is able to complete greater than 4 METS of physical activity.  Patient was advised that if he develops new symptoms prior to surgery to contact our office to arrange a follow-up appointment.  He verbalized understanding.  -Xarelto is managed by PCP for hx of DVT. Recommendations for holding Xarelto will need to come from PCP.   I will route this recommendation to the requesting party via Epic fax function and remove from pre-op pool.   06/14/2023, 7:52 AM     Time:   Today, I have spent  5 minutes with the patient with telehealth technology discussing medical history, symptoms, and management plan.  I spent 10 minutes reviewing his past medical history, cardiac medications and cardiac  test.   Ronney Asters, NP  06/14/2023, 7:19 AM

## 2023-06-19 ENCOUNTER — Inpatient Hospital Stay: Payer: Medicare Other | Attending: Hematology and Oncology

## 2023-06-19 ENCOUNTER — Other Ambulatory Visit: Payer: Self-pay | Admitting: Hematology and Oncology

## 2023-06-19 ENCOUNTER — Inpatient Hospital Stay (HOSPITAL_BASED_OUTPATIENT_CLINIC_OR_DEPARTMENT_OTHER): Payer: Medicare Other | Admitting: Hematology and Oncology

## 2023-06-19 VITALS — BP 139/76 | HR 56 | Temp 97.3°F | Resp 16 | Wt 206.0 lb

## 2023-06-19 DIAGNOSIS — C8200 Follicular lymphoma grade I, unspecified site: Secondary | ICD-10-CM | POA: Diagnosis present

## 2023-06-19 DIAGNOSIS — Z7901 Long term (current) use of anticoagulants: Secondary | ICD-10-CM | POA: Insufficient documentation

## 2023-06-19 DIAGNOSIS — I82411 Acute embolism and thrombosis of right femoral vein: Secondary | ICD-10-CM | POA: Diagnosis not present

## 2023-06-19 DIAGNOSIS — C8203 Follicular lymphoma grade I, intra-abdominal lymph nodes: Secondary | ICD-10-CM | POA: Diagnosis not present

## 2023-06-19 LAB — CMP (CANCER CENTER ONLY)
ALT: 20 U/L (ref 0–44)
AST: 17 U/L (ref 15–41)
Albumin: 4.1 g/dL (ref 3.5–5.0)
Alkaline Phosphatase: 80 U/L (ref 38–126)
Anion gap: 4 — ABNORMAL LOW (ref 5–15)
BUN: 20 mg/dL (ref 8–23)
CO2: 27 mmol/L (ref 22–32)
Calcium: 9.6 mg/dL (ref 8.9–10.3)
Chloride: 108 mmol/L (ref 98–111)
Creatinine: 1.18 mg/dL (ref 0.61–1.24)
GFR, Estimated: >60 mL/min (ref >60)
Glucose, Bld: 126 mg/dL — ABNORMAL HIGH (ref 70–99)
Potassium: 4.1 mmol/L (ref 3.5–5.1)
Sodium: 139 mmol/L (ref 135–145)
Total Bilirubin: 1 mg/dL (ref 0.0–1.2)
Total Protein: 6.7 g/dL (ref 6.5–8.1)

## 2023-06-19 LAB — CBC WITH DIFFERENTIAL (CANCER CENTER ONLY)
Abs Immature Granulocytes: 0.03 10*3/uL (ref 0.00–0.07)
Basophils Absolute: 0.1 10*3/uL (ref 0.0–0.1)
Basophils Relative: 1 %
Eosinophils Absolute: 0.3 10*3/uL (ref 0.0–0.5)
Eosinophils Relative: 4 %
HCT: 42.7 % (ref 39.0–52.0)
Hemoglobin: 14.2 g/dL (ref 13.0–17.0)
Immature Granulocytes: 1 %
Lymphocytes Relative: 20 %
Lymphs Abs: 1.4 10*3/uL (ref 0.7–4.0)
MCH: 32.2 pg (ref 26.0–34.0)
MCHC: 33.3 g/dL (ref 30.0–36.0)
MCV: 96.8 fL (ref 80.0–100.0)
Monocytes Absolute: 0.7 10*3/uL (ref 0.1–1.0)
Monocytes Relative: 10 %
Neutro Abs: 4.3 10*3/uL (ref 1.7–7.7)
Neutrophils Relative %: 64 %
Platelet Count: 223 10*3/uL (ref 150–400)
RBC: 4.41 MIL/uL (ref 4.22–5.81)
RDW: 12.6 % (ref 11.5–15.5)
WBC Count: 6.7 10*3/uL (ref 4.0–10.5)
nRBC: 0 % (ref 0.0–0.2)

## 2023-06-19 LAB — LACTATE DEHYDROGENASE: LDH: 130 U/L (ref 98–192)

## 2023-06-19 NOTE — Progress Notes (Signed)
 Taylor Berger [facility] [phone]   [phone]  PROGRESS NOTE  Patient Care Team: Jarold Motto, [place] as PCP - General (Physician Assistant) Parke Poisson, MD as PCP - Cardiology (Cardiology)  Hematological/Oncological History # Follicular Lymphoma Stage I Grade IA 1) 05/20/2019: CT scan showed stable mesenteric nodule, largest 4.2 cm and mild periaortic lymphadenopathy.  2) 06/04/2019: biopsy of abdominal lymph node showed grade 1 follicular lymphoma. Observation recommended by his oncologist at [facility] in [place], [place].  3) 03/14/2020: establish care with Dr. Leonides Schanz  4) 04/13/2020: CT scan showed stable mesenteric lymphadenopathy, measuring 3.9 x 1.9 cm. 5) 11/17/2020: presented to ED for abdominal discomfort. Underwent CT A/P and found to have relatively stable lymph node measuring 4.3 x 2.3 cm.  6) 08/01/2021: CT Ab/Pelvis ordered for LLQ pain. Fidings show mesenteric lymph node conglomerate, largest component measuring up to 4.3 x 2.2 cm.  HISTORY OF PRESENTING ILLNESS:  Taylor Berger 78 y.o. male with medical history significant for HTN, HLD, DVT ,GERD, and follicular lymphoma (diagnosed 06/2019 and under observation) presents for a follow up visit for follicular lymphoma and recurrent RLE DVT.   On exam today Taylor Berger notes he has been well overall interim since her last visit with no major changes in his health.  He reports he is in the process of getting set up for carpal tunnel surgery on his right hand as he has previously performed on his left hand.  He reports he is tolerating his Xarelto well 10 mg p.o. daily.  He does not causing any bleeding, bruising, or dark stools.  He is unsure what he pays for it but he reports that cost does not inhibitive.  He notes he does have some occasional explained bruising that he bumped himself but otherwise no concerns.  He said no recent infectious symptoms such as runny nose, sore throat, or  cough.  He notes he does not have any lymphadenopathy, abdominal swelling, or unexplained weight loss.  He would however like to lose weight, and his goal is to lose 20 pounds.  Overall he feels well and has no questions concerns or complaints today. He denies any fevers, chills, sweats, nausea vomiting or diarrhea.  Otherwise a full point ROS is listed below.   MEDICAL HISTORY:  Past Medical History:  Diagnosis Date   Allergy    Seasonal   Aortic aneurysm (HCC)    Depression    DVT (deep venous thrombosis) (HCC) 2020   GERD (gastroesophageal reflux disease)    Goiter    Hyperlipidemia    Hypertension    Lymphoma (HCC) 2020   Follicular, stomach   Neuropathy of both feet     SURGICAL HISTORY: Past Surgical History:  Procedure Laterality Date   CARPAL TUNNEL RELEASE Left    CERVICAL DISCECTOMY  1995   C-7   CERVICAL FUSION  01/1989   C-3 through C-6 / no metal   COLONOSCOPY     ELBOW SURGERY Left    FOOT SURGERY Left 1999   Plantar fascitis   low back fusion  2013   Lumbar & Low Thoracic with metal   SHOULDER ARTHROSCOPY WITH ROTATOR CUFF REPAIR Right 2014   UPPER GASTROINTESTINAL ENDOSCOPY      SOCIAL HISTORY: Social History   Socioeconomic History   Marital status: Married    Spouse name: Not on file   Number of children: Not on file   Years of education: Not on file   Highest education level:  Master's degree (e.g., MA, MS, MEng, MEd, MSW, MBA)  Occupational History   Occupation: retired   Tobacco Use   Smoking status: Never   Smokeless tobacco: Never  Vaping Use   Vaping status: Never Used  Substance and Sexual Activity   Alcohol use: Not Currently   Drug use: Never   Sexual activity: Not Currently  Other Topics Concern   Not on file  Social History Narrative   ** Merged History Encounter **       Moved from [facility] to be closer to son Married   Social Drivers of Corporate investment banker Strain: Low Risk  (02/11/2023)   Overall Financial Resource  Strain (CARDIA)    Difficulty of Paying Living Expenses: Not hard at all  Food Insecurity: No Food Insecurity (02/11/2023)   Hunger Vital Sign    Worried About Running Out of Food in the Last Year: Never true    Ran Out of Food in the Last Year: Never true  Transportation Needs: No Transportation Needs (02/11/2023)   PRAPARE - Administrator, Civil Service (Medical): No    Lack of Transportation (Non-Medical): No  Physical Activity: Inactive (02/11/2023)   Exercise Vital Sign    Days of Exercise per Week: 0 days    Minutes of Exercise per Session: 20 min  Stress: Stress Concern Present (02/11/2023)   [employer] of Occupational Health - Occupational Stress Questionnaire    Feeling of Stress : Rather much  Social Connections: Socially Isolated (02/11/2023)   Social Connection and Isolation Panel [NHANES]    Frequency of Communication with Friends and Family: Once a week    Frequency of Social Gatherings with Friends and Family: Never    Attends Religious Services: Never    Database administrator or Organizations: No    Attends Banker Meetings: Never    Marital Status: Married  Catering manager Violence: Not At Risk (07/23/2022)   Humiliation, Afraid, Rape, and Kick questionnaire    Fear of Current or Ex-Partner: No    Emotionally Abused: No    Physically Abused: No    Sexually Abused: No    FAMILY HISTORY: Family History  Problem Relation Age of Onset   Diabetes Mother    Hypertension Mother    Hyperlipidemia Mother    Heart failure Mother    Leukemia Father    Stroke Father    Esophageal cancer Neg Hx    Colon polyps Neg Hx    Pancreatic cancer Neg Hx    Stomach cancer Neg Hx    Rectal cancer Neg Hx     ALLERGIES:  has no known allergies.  MEDICATIONS:  Current Outpatient Medications  Medication Sig Dispense Refill   amLODipine (NORVASC) 10 MG tablet Take 1 tablet by mouth once daily 90 tablet 1   atorvastatin (LIPITOR) 80 MG tablet  Take 1 tablet by mouth once daily 90 tablet 0   cetirizine (ZYRTEC) 10 MG tablet Take 10 mg by mouth daily.     clindamycin (CLEOCIN-T) 1 % external solution Apply topically 2 (two) times daily as needed. 30 mL 1   esomeprazole (NEXIUM) 20 MG capsule Take 20 mg by mouth daily at 12 noon.     ezetimibe (ZETIA) 10 MG tablet Take 1 tablet (10 mg total) by mouth daily. 90 tablet 3   FIBER ADULT GUMMIES PO Take 2 each by mouth daily in the afternoon.     fluticasone (FLONASE) 50 MCG/ACT nasal spray Place  into both nostrils as needed for allergies or rhinitis.     lisinopril (ZESTRIL) 5 MG tablet Take 1 tablet by mouth once daily 90 tablet 0   Probiotic Product (PROBIOTIC GUMMIES PO) Take 3 each by mouth daily in the afternoon.     rivaroxaban (XARELTO) 10 MG TABS tablet Take 1 tablet (10 mg total) by mouth daily. 90 tablet 3   sertraline (ZOLOFT) 25 MG tablet Take 1 tablet (25 mg total) by mouth daily. 90 tablet 1   valACYclovir (VALTREX) 500 MG tablet Take 1 tablet (500 mg total) by mouth daily. 90 tablet 3   No current facility-administered medications for this visit.    REVIEW OF SYSTEMS:   Constitutional: ( - ) fevers, ( - )  chills , ( - ) night sweats Eyes: ( - ) blurriness of vision, ( - ) double vision, ( - ) watery eyes Ears, nose, mouth, throat, and face: ( - ) mucositis, ( - ) sore throat Respiratory: ( - ) cough, ( - ) dyspnea, ( - ) wheezes Cardiovascular: ( - ) palpitation, ( - ) chest discomfort, ( - ) lower extremity swelling Gastrointestinal:  ( - ) nausea, ( - ) heartburn, ( - ) change in bowel habits Skin: ( - ) abnormal skin rashes Lymphatics: ( - ) new lymphadenopathy, ( - ) easy bruising Neurological: ( - ) tingling, ( - ) new weaknesses. + chronic neuropathy in lower extremities Behavioral/Psych: ( - ) mood change, ( - ) new changes  All other systems were reviewed with the patient and are negative.  PHYSICAL EXAMINATION: ECOG PERFORMANCE STATUS: 0 -  Asymptomatic  Vitals:   06/19/23 1455  BP: 139/76  Pulse: (!) 56  Resp: 16  Temp: (!) 97.3 F (36.3 C)  SpO2: 95%        Filed Weights   06/19/23 1455  Weight: 206 lb (93.4 kg)       GENERAL: well appearing elderly Caucasian male, appears younger than stated age, in NAD  SKIN: skin color, texture, turgor are normal, no rashes or significant lesions EYES: conjunctiva are pink and non-injected, sclera clear LUNGS: clear to auscultation and percussion with normal breathing effort HEART: regular rate & rhythm and no murmurs. No LE edema or erythema. Musculoskeletal: no cyanosis of digits and no clubbing  PSYCH: alert & oriented x 3, fluent speech NEURO: no focal motor/sensory deficits  LABORATORY DATA:  I have reviewed the data as listed    Latest Ref Rng & Units 06/19/2023    2:39 PM 05/07/2023   11:41 AM 12/14/2022    1:46 PM  CBC  WBC 4.0 - 10.5 K/uL 6.7  7.9  6.1   Hemoglobin 13.0 - 17.0 g/dL 16.1  09.6  04.5   Hematocrit 39.0 - 52.0 % 42.7  42.1  41.0   Platelets 150 - 400 K/uL 223  255.0  220        Latest Ref Rng & Units 06/19/2023    2:39 PM 05/07/2023   11:41 AM 02/27/2023    2:56 PM  CMP  Glucose 70 - 99 mg/dL 409  99  811   BUN 8 - 23 mg/dL 20  17  20    Creatinine 0.61 - 1.24 mg/dL 9.14  7.82  9.56   Sodium 135 - 145 mmol/L 139  140  143   Potassium 3.5 - 5.1 mmol/L 4.1  3.7  4.6   Chloride 98 - 111 mmol/L 108  106  105  CO2 22 - 32 mmol/L 27  26  21    Calcium 8.9 - 10.3 mg/dL 9.6  9.6  13.2   Total Protein 6.5 - 8.1 g/dL 6.7  7.0    Total Bilirubin 0.0 - 1.2 mg/dL 1.0  1.0    Alkaline Phos 38 - 126 U/L 80  70    AST 15 - 41 U/L 17  20    ALT 0 - 44 U/L 20  24       PATHOLOGY:     RADIOGRAPHIC STUDIES: I have personally reviewed the radiological images as listed and agreed with the findings in the report. No results found.   ASSESSMENT & PLAN Taylor Berger 78 y.o. male with medical history significant for HTN, HLD, DVT ,GERD, and  follicular lymphoma (diagnosed 06/2019 and under observation) presents to establish care with oncology for his follicular lymphoma.  After review the labs, review the records, discussion with the patient the findings are most consistent with a low-grade stage I follicular lymphoma of the lymph node of the abdomen.  At this time there is no indication for treatment as there is no bulky disease, no B symptoms, and no cytopenias.  We can continue to follow this patient in clinic with lab work and clinical follow-up.   #Grade IA Follicular Lymphoma --diagnosed in [place] in March 2021. Biopsy confirms Grade IA follicular lymphoma with a Ki-67 of 5-10%. Currently on observation.  --Reviewed labs from today without any intervention needed. CT abdomen/pelvis from 08/01/2021 revealed mesenteric lymphadenopathy measuring 4.3x 2.2 cm, not significantly enlarged from prior. Recommendation is to continue with observation and return in 6 months with lab work. Last CT scan showed stable disease, no indication for repeat imaging. Plan: -- Baseline labs to include CBC, CMP, LDH each visit.  -- labs today show white blood cell count 6.7, hemoglobin 14.2, MCV 96.8, platelets 223 -- no need for routine imaging unless patient were to develop worsening blood counts or new symptoms.  --continue monitoring with q 6 month clinic visits and labs  #DVT x 2 episodes.  --Initially diagnosed with DVT of the right femoral through popliteal veins on 12/10/2018. Treated with 6 months of anticoagulation therapy. At initial visit, discussed the risks of recurrent VTE and the recommendation for indefinite anticoagulation as it was likely an unprovoked event. Patient elected to continue with observation for recurrent clot. --On 03/30/20, patient had another DVT of the right lower extremity. Doppler US was obtained that revealed acute DVT in the right gastrocnemius veins, right intramuscular calf vein. In addition, there were two pairs of  acutely thrombosed veins appear to arise from the popliteal vein and are thrombosed to the mid calf. No acute DVT in the popliteal vein. There was evidence of chronic deep vein thrombosis involving the right femoral vein, and popliteal vein. Likely provoked by recent episode of diverticulitis. Patient was started on Xarelto 20mg  PO daily.  --Discussed the recommendation for indefinite anticoagulation due to recurrent episodes of DVTs, with initial episode that was likely unprovoked. Patient was in agreement with this recommendation.  Plan:  --continue Xaretlo 10 mg PO daily as maintenance therapy. Will continue indefinitely.  --RTC in 6 months time  #Health maintenance --Patient follow with GI for Barrett's esophagus.  --Patient is up to date on COVID and influenza vaccinations.   #Right mid thyroid nodule --Underwent fine needle aspiration on 05/31/20, results show benign tissue.   No orders of the defined types were placed in this encounter.   All questions  were answered. The patient knows to call the clinic with any problems, questions or concerns.  Ulysees Barns, MD Department of Hematology/Oncology [facility] Phone: [phone] Pager: [phone] Email: [email or web address]  06/19/2023 4:10 PM

## 2023-06-27 ENCOUNTER — Encounter: Payer: Self-pay | Admitting: Physician Assistant

## 2023-06-27 ENCOUNTER — Ambulatory Visit (INDEPENDENT_AMBULATORY_CARE_PROVIDER_SITE_OTHER): Admitting: Physician Assistant

## 2023-06-27 VITALS — BP 110/76 | HR 62 | Temp 98.4°F | Ht 68.0 in | Wt 201.5 lb

## 2023-06-27 DIAGNOSIS — R051 Acute cough: Secondary | ICD-10-CM

## 2023-06-27 MED ORDER — HYDROCOD POLI-CHLORPHE POLI ER 10-8 MG/5ML PO SUER: 5.0000 mL | Freq: Every evening | ORAL | 0 refills | Status: DC | PRN

## 2023-06-27 MED ORDER — PREDNISONE 20 MG PO TABS: 20.0000 mg | ORAL_TABLET | Freq: Two times a day (BID) | ORAL | 0 refills | Status: DC

## 2023-06-27 MED ORDER — AZITHROMYCIN 250 MG PO TABS: ORAL_TABLET | ORAL | 0 refills | Status: AC

## 2023-06-27 NOTE — Progress Notes (Signed)
 Taylor Berger is a 78 y.o. male here for a new problem.  History of Present Illness:   Chief Complaint  Patient presents with   Cough    Pt c/o cough x 10 days, coughing and expectorating green/yellow sputum, cough is keeping him up at night, ribs hurt with coughing.    Cough  Patient complains of a cough that has persisted for the past 10-14 days.  Associated symptoms include expectorating green/yellow sputum, ear pain, and sinus pressure.  He states that his symptoms are keeping him up at night and causing rib/abdominal discomfort.   Reports taking OTC allergy medications and nyguil to help relief his symptoms.     Denies any recent travel or sore throat.     Past Medical History:  Diagnosis Date   Allergy    Seasonal   Aortic aneurysm (HCC)    Depression    DVT (deep venous thrombosis) (HCC) 2020   GERD (gastroesophageal reflux disease)    Goiter    Hyperlipidemia    Hypertension    Lymphoma (HCC) 2020   Follicular, stomach   Neuropathy of both feet      Social History   Tobacco Use   Smoking status: Never   Smokeless tobacco: Never  Vaping Use   Vaping status: Never Used  Substance Use Topics   Alcohol use: Not Currently   Drug use: Never    Past Surgical History:  Procedure Laterality Date   CARPAL TUNNEL RELEASE Left    CERVICAL DISCECTOMY  1995   C-7   CERVICAL FUSION  01/1989   C-3 through C-6 / no metal   COLONOSCOPY     ELBOW SURGERY Left    FOOT SURGERY Left 1999   Plantar fascitis   low back fusion  2013   Lumbar & Low Thoracic with metal   SHOULDER ARTHROSCOPY WITH ROTATOR CUFF REPAIR Right 2014   UPPER GASTROINTESTINAL ENDOSCOPY      Family History  Problem Relation Age of Onset   Diabetes Mother    Hypertension Mother    Hyperlipidemia Mother    Heart failure Mother    Leukemia Father    Stroke Father    Esophageal cancer Neg Hx    Colon polyps Neg Hx    Pancreatic cancer Neg Hx    Stomach cancer Neg Hx    Rectal cancer Neg Hx      No Known Allergies  Current Medications:   Current Outpatient Medications:    amLODipine (NORVASC) 10 MG tablet, Take 1 tablet by mouth once daily, Disp: 90 tablet, Rfl: 1   atorvastatin (LIPITOR) 80 MG tablet, Take 1 tablet by mouth once daily, Disp: 90 tablet, Rfl: 0   azithromycin (ZITHROMAX) 250 MG tablet, Take 2 tablets on day 1, then 1 tablet daily on days 2 through 5, Disp: 6 tablet, Rfl: 0   cetirizine (ZYRTEC) 10 MG tablet, Take 10 mg by mouth daily., Disp: , Rfl:    chlorpheniramine-HYDROcodone (TUSSIONEX) 10-8 MG/5ML, Take 5 mLs by mouth at bedtime as needed., Disp: 115 mL, Rfl: 0   clindamycin (CLEOCIN-T) 1 % external solution, Apply topically 2 (two) times daily as needed., Disp: 30 mL, Rfl: 1   esomeprazole (NEXIUM) 20 MG capsule, Take 20 mg by mouth daily at 12 noon., Disp: , Rfl:    ezetimibe (ZETIA) 10 MG tablet, Take 1 tablet (10 mg total) by mouth daily., Disp: 90 tablet, Rfl: 3   FIBER ADULT GUMMIES PO, Take 2 each by mouth daily  in the afternoon., Disp: , Rfl:    fluticasone (FLONASE) 50 MCG/ACT nasal spray, Place into both nostrils as needed for allergies or rhinitis., Disp: , Rfl:    lisinopril (ZESTRIL) 5 MG tablet, Take 1 tablet by mouth once daily, Disp: 90 tablet, Rfl: 0   predniSONE (DELTASONE) 20 MG tablet, Take 1 tablet (20 mg total) by mouth 2 (two) times daily with a meal., Disp: 10 tablet, Rfl: 0   Probiotic Product (PROBIOTIC GUMMIES PO), Take 3 each by mouth daily in the afternoon., Disp: , Rfl:    rivaroxaban (XARELTO) 10 MG TABS tablet, Take 1 tablet (10 mg total) by mouth daily., Disp: 90 tablet, Rfl: 3   sertraline (ZOLOFT) 25 MG tablet, Take 1 tablet (25 mg total) by mouth daily., Disp: 90 tablet, Rfl: 1   valACYclovir (VALTREX) 500 MG tablet, Take 1 tablet (500 mg total) by mouth daily., Disp: 90 tablet, Rfl: 3   Review of Systems:   Review of Systems  HENT:  Positive for ear pain and sinus pain. Negative for sore throat.   Respiratory:   Positive for cough.   Negative unless otherwise specified per HPI.  Vitals:   Vitals:   06/27/23 0849  BP: 110/76  Pulse: 62  Temp: 98.4 F (36.9 C)  TempSrc: Temporal  SpO2: 93%  Weight: 201 lb 8 oz (91.4 kg)  Height: 5\' 8"  (1.727 m)     Body mass index is 30.64 kg/m.  Physical Exam:   Physical Exam Vitals and nursing note reviewed.  Constitutional:      General: He is not in acute distress.    Appearance: He is well-developed. He is not ill-appearing or toxic-appearing.  HENT:     Head: Normocephalic and atraumatic.     Right Ear: Ear canal and external ear normal. A middle ear effusion is present. Tympanic membrane is not erythematous, retracted or bulging.     Left Ear: Ear canal and external ear normal. A middle ear effusion is present. Tympanic membrane is not erythematous, retracted or bulging.     Nose:     Right Sinus: Frontal sinus tenderness present. No maxillary sinus tenderness.     Left Sinus: Frontal sinus tenderness present. No maxillary sinus tenderness.     Mouth/Throat:     Pharynx: Uvula midline. Posterior oropharyngeal erythema present.  Eyes:     General: Lids are normal.     Conjunctiva/sclera: Conjunctivae normal.  Neck:     Trachea: Trachea normal.  Cardiovascular:     Rate and Rhythm: Normal rate and regular rhythm.     Heart sounds: Normal heart sounds, S1 normal and S2 normal.  Pulmonary:     Effort: Pulmonary effort is normal.     Breath sounds: Normal breath sounds. No decreased breath sounds, wheezing, rhonchi or rales.  Lymphadenopathy:     Cervical: No cervical adenopathy.  Skin:    General: Skin is warm and dry.  Neurological:     Mental Status: He is alert.  Psychiatric:        Speech: Speech normal.        Behavior: Behavior normal. Behavior is cooperative.     Assessment and Plan:   Acute cough No red flags on exam.   Will initiate azithromycin, prednisone and tussionex (sleepy precautions advised) per orders.   Discussed taking medications as prescribed.  Reviewed return precautions including new or worsening fever, SOB, new or worsening cough or other concerns.  Push fluids and rest.  I recommend  that patient follow-up if symptoms worsen or persist despite treatment x 7-10 days, sooner if needed.   Jarold Motto, PA-C  I,Safa M Kadhim,acting as a scribe for Jarold Motto, PA.,have documented all relevant documentation on the behalf of Jarold Motto, PA,as directed by  Jarold Motto, PA while in the presence of Jarold Motto, [place].   I, Jarold Motto, [place], have reviewed all documentation for this visit. The documentation on 06/27/23 for the exam, diagnosis, procedures, and orders are all accurate and complete.

## 2023-08-05 ENCOUNTER — Ambulatory Visit (INDEPENDENT_AMBULATORY_CARE_PROVIDER_SITE_OTHER): Admitting: Physician Assistant

## 2023-08-05 ENCOUNTER — Ambulatory Visit: Payer: Self-pay

## 2023-08-05 ENCOUNTER — Encounter: Payer: Self-pay | Admitting: Physician Assistant

## 2023-08-05 ENCOUNTER — Ambulatory Visit (INDEPENDENT_AMBULATORY_CARE_PROVIDER_SITE_OTHER)

## 2023-08-05 ENCOUNTER — Telehealth: Payer: Self-pay | Admitting: Physician Assistant

## 2023-08-05 VITALS — BP 120/70 | HR 63 | Temp 98.3°F | Ht 68.0 in | Wt 209.4 lb

## 2023-08-05 DIAGNOSIS — R229 Localized swelling, mass and lump, unspecified: Secondary | ICD-10-CM | POA: Diagnosis not present

## 2023-08-05 DIAGNOSIS — R42 Dizziness and giddiness: Secondary | ICD-10-CM

## 2023-08-05 DIAGNOSIS — I739 Peripheral vascular disease, unspecified: Secondary | ICD-10-CM

## 2023-08-05 DIAGNOSIS — R052 Subacute cough: Secondary | ICD-10-CM

## 2023-08-05 LAB — COMPREHENSIVE METABOLIC PANEL WITH GFR
ALT: 21 U/L (ref 0–53)
AST: 18 U/L (ref 0–37)
Albumin: 4.1 g/dL (ref 3.5–5.2)
Alkaline Phosphatase: 72 U/L (ref 39–117)
BUN: 22 mg/dL (ref 6–23)
CO2: 28 meq/L (ref 19–32)
Calcium: 9.6 mg/dL (ref 8.4–10.5)
Chloride: 107 meq/L (ref 96–112)
Creatinine, Ser: 1.01 mg/dL (ref 0.40–1.50)
GFR: 70.79 mL/min (ref >60.00)
Glucose, Bld: 91 mg/dL (ref 70–99)
Potassium: 3.7 meq/L (ref 3.5–5.1)
Sodium: 142 meq/L (ref 135–145)
Total Bilirubin: 0.7 mg/dL (ref 0.2–1.2)
Total Protein: 6.7 g/dL (ref 6.0–8.3)

## 2023-08-05 LAB — CBC WITH DIFFERENTIAL/PLATELET
Basophils Absolute: 0.1 10*3/uL (ref 0.0–0.1)
Basophils Relative: 1 % (ref 0.0–3.0)
Eosinophils Absolute: 0.3 10*3/uL (ref 0.0–0.7)
Eosinophils Relative: 4.4 % (ref 0.0–5.0)
HCT: 40.6 % (ref 39.0–52.0)
Hemoglobin: 13.7 g/dL (ref 13.0–17.0)
Lymphocytes Relative: 23.3 % (ref 12.0–46.0)
Lymphs Abs: 1.6 10*3/uL (ref 0.7–4.0)
MCHC: 33.7 g/dL (ref 30.0–36.0)
MCV: 96.2 fl (ref 78.0–100.0)
Monocytes Absolute: 0.9 10*3/uL (ref 0.1–1.0)
Monocytes Relative: 12.6 % — ABNORMAL HIGH (ref 3.0–12.0)
Neutro Abs: 4.1 10*3/uL (ref 1.4–7.7)
Neutrophils Relative %: 58.7 % (ref 43.0–77.0)
Platelets: 227 10*3/uL (ref 150.0–400.0)
RBC: 4.22 Mil/uL (ref 4.22–5.81)
RDW: 13.7 % (ref 11.5–15.5)
WBC: 7 10*3/uL (ref 4.0–10.5)

## 2023-08-05 LAB — URINALYSIS, ROUTINE W REFLEX MICROSCOPIC
Bilirubin Urine: NEGATIVE
Hgb urine dipstick: NEGATIVE
Ketones, ur: NEGATIVE
Leukocytes,Ua: NEGATIVE
Nitrite: NEGATIVE
RBC / HPF: NONE SEEN (ref 0–?)
Specific Gravity, Urine: >=1.03 — AB (ref 1.000–1.030)
Total Protein, Urine: NEGATIVE
Urine Glucose: NEGATIVE
Urobilinogen, UA: 0.2 (ref 0.0–1.0)
pH: 6 (ref 5.0–8.0)

## 2023-08-05 LAB — VITAMIN B12: Vitamin B-12: 346 pg/mL (ref 211–911)

## 2023-08-05 LAB — TSH: TSH: 5.21 u[IU]/mL (ref 0.35–5.50)

## 2023-08-05 NOTE — Progress Notes (Signed)
 Taylor Berger is a 78 y.o. male here for management of a pre-existing problem.  History of Present Illness:   Chief Complaint  Patient presents with   Bulge    Pt c/o bulge in right groin area, noticed last Friday 5/2, tender to touch, looks bruised.   Cough    Pt c/o lingering cough, expectorating clear/yellow sputum.   Dizziness    Pt c/o dizziness off and on x 10 days, quick movement with his head it happens.    HPI  Lightheadedness / Dizziness: Pt complains of intermittent lightheadedness and dizziness starting 10 days ago.  Pt has hx of calcified arteries in BLE and two DVT in thigh. Pt reports dizziness has been worsening over time. He describes a recent quicker onset of dizziness. He also reports associated lightheadedness and difficulty balancing. Pt states dizziness comes on with activity. Pt endorses good nutrition, but not water intake; usually drinks more tea. He also endorses stretching his legs. He states he is not UTD with his eye exam and does not consume much salt. Denies chest pain, shortness of breath, or vision changes. Denies headache(s) MR brain and CT head neck completed in Aug 2024 was overall normal  Cough: Pt complains of an ongoing productive cough with clear/yellow sputum starting 06/2023. Pt is on Flonase 2 spritz BID and Zyrtec 10 mg once daily. He reports he coughs mostly at night, which wakes him up. Pt suspects he has OSA due to snoring at night. He endorses taking Nyquil at night, to minimal effect. He states he will now try Mucinex  at night. Pt is not agreeable to wearing a CPAP, but will f/u with Dr. Chancy Comber of cardiology for OSA.  Cyst on groin area: Pt complains of a cyst on his right groin area, starting Friday 5/2.  Pt reports that last Friday, the cyst had major swelling, but has since gone down by itself. He does not endorse any at home treatment. He reports slight tenderness upon palpation. Pt is seeing dermatology soon.  Past  Medical History:  Diagnosis Date   Allergy    Seasonal   Aortic aneurysm (HCC)    Depression    DVT (deep venous thrombosis) (HCC) 2020   GERD (gastroesophageal reflux disease)    Goiter    Hyperlipidemia    Hypertension    Lymphoma (HCC) 2020   Follicular, stomach   Neuropathy of both feet      Social History   Tobacco Use   Smoking status: Never   Smokeless tobacco: Never  Vaping Use   Vaping status: Never Used  Substance Use Topics   Alcohol use: Not Currently   Drug use: Never    Past Surgical History:  Procedure Laterality Date   CARPAL TUNNEL RELEASE Left    CERVICAL DISCECTOMY  1995   C-7   CERVICAL FUSION  01/1989   C-3 through C-6 / no metal   COLONOSCOPY     ELBOW SURGERY Left    FOOT SURGERY Left 1999   Plantar fascitis   low back fusion  2013   Lumbar & Low Thoracic with metal   SHOULDER ARTHROSCOPY WITH ROTATOR CUFF REPAIR Right 2014   UPPER GASTROINTESTINAL ENDOSCOPY      Family History  Problem Relation Age of Onset   Diabetes Mother    Hypertension Mother    Hyperlipidemia Mother    Heart failure Mother    Leukemia Father    Stroke Father    Esophageal cancer Neg Hx  Colon polyps Neg Hx    Pancreatic cancer Neg Hx    Stomach cancer Neg Hx    Rectal cancer Neg Hx     No Known Allergies  Current Medications:   Current Outpatient Medications:    amLODipine  (NORVASC ) 10 MG tablet, Take 1 tablet by mouth once daily, Disp: 90 tablet, Rfl: 1   atorvastatin  (LIPITOR) 80 MG tablet, Take 1 tablet by mouth once daily, Disp: 90 tablet, Rfl: 0   cetirizine (ZYRTEC) 10 MG tablet, Take 10 mg by mouth daily., Disp: , Rfl:    clindamycin  (CLEOCIN -T) 1 % external solution, Apply topically 2 (two) times daily as needed., Disp: 30 mL, Rfl: 1   esomeprazole  (NEXIUM ) 20 MG capsule, Take 20 mg by mouth daily at 12 noon., Disp: , Rfl:    ezetimibe  (ZETIA ) 10 MG tablet, Take 1 tablet (10 mg total) by mouth daily., Disp: 90 tablet, Rfl: 3   FIBER ADULT  GUMMIES PO, Take 2 each by mouth daily in the afternoon., Disp: , Rfl:    fluticasone (FLONASE) 50 MCG/ACT nasal spray, Place into both nostrils as needed for allergies or rhinitis., Disp: , Rfl:    lisinopril  (ZESTRIL ) 5 MG tablet, Take 1 tablet by mouth once daily, Disp: 90 tablet, Rfl: 0   Probiotic Product (PROBIOTIC GUMMIES PO), Take 3 each by mouth daily in the afternoon., Disp: , Rfl:    rivaroxaban  (XARELTO ) 10 MG TABS tablet, Take 1 tablet (10 mg total) by mouth daily., Disp: 90 tablet, Rfl: 3   sertraline  (ZOLOFT ) 25 MG tablet, Take 1 tablet (25 mg total) by mouth daily., Disp: 90 tablet, Rfl: 1   valACYclovir  (VALTREX ) 500 MG tablet, Take 1 tablet (500 mg total) by mouth daily., Disp: 90 tablet, Rfl: 3   Review of Systems:   Negative unless otherwise specified per HPI.  Vitals:   Vitals:   08/05/23 1123 08/05/23 1201 08/05/23 1202 08/05/23 1204  BP: 120/70     Pulse: 63     Temp: 98.3 F (36.8 C)     TempSrc: Temporal     SpO2: 99% 96% 94% 93%  Weight: 209 lb 6.1 oz (95 kg)     Height: 5\' 8"  (1.727 m)        Body mass index is 31.84 kg/m.  Physical Exam:   Physical Exam Vitals and nursing note reviewed.  Constitutional:      General: He is not in acute distress.    Appearance: He is well-developed. He is not ill-appearing or toxic-appearing.  Cardiovascular:     Rate and Rhythm: Normal rate and regular rhythm.     Pulses: Normal pulses.     Heart sounds: Normal heart sounds, S1 normal and S2 normal.  Pulmonary:     Effort: Pulmonary effort is normal.     Breath sounds: Normal breath sounds.  Skin:    General: Skin is warm and dry.     Comments: Raised lesion with possible ecchymosis to right inguinal area approx 1/2 cm in diameter  Neurological:     Mental Status: He is alert.     GCS: GCS eye subscore is 4. GCS verbal subscore is 5. GCS motor subscore is 6.  Psychiatric:        Speech: Speech normal.        Behavior: Behavior normal. Behavior is  cooperative.     Assessment and Plan:   1. Intermittent lightheadedness (Primary) Update blood work and provide recommendations accordingly Push fluids We are going  to stop his lisinopril  5 mg -- has had some issues with hypotension in the past -- will make sure he is not having episodes of low blood pressure -- continue to monitor blood pressure; continue amlodipine  10 mg daily If symptom(s) do not improve, reach out and we will likely refer him back to his cardiologist vs obtain new brain imaging Recommend follow up with eye doctor for yearly eye exam - CBC with Differential/Platelet - Comprehensive metabolic panel with GFR - Vitamin B12 - Urinalysis, Routine w reflex microscopic - DG Chest 2 View; Future  2. Subacute cough Update chest xray  Consider mucinex  daily Consider referral to pulmonary He does suspect that he has obstructive sleep apnea but does not want testing or a CPAP  - TSH - DG Chest 2 View; Future  3. High risk medication use - TSH  4. Localized skin mass, lump, or swelling Unclear etiology Area is improving If worsening, reach out  If not improved by the time he sees dermatology, recommend close follow up with them   5. Peripheral arterial disease Will refer to vascular to discuss calcifications in legs and decreased sensation  I, Timoteo Force, acting as a Neurosurgeon for [employer], [place]., have documented all relevant documentation on the behalf of Alexander Iba, [place], as directed by  Alexander Iba, PA while in the presence of Alexander Iba, [place].  I, Alexander Iba, [place], have reviewed all documentation for this visit. The documentation on 08/05/23 for the exam, diagnosis, procedures, and orders are all accurate and complete.  Alexander Iba, PA-C

## 2023-08-05 NOTE — Telephone Encounter (Signed)
 On 08/03/23, pt made an OV w/pcp for 08/05/23 and mentioned experiencing worsening dizziness in the notes. I called pt to clarify and he states dizziness has been onset x 1 week, intermittent but worsening. Pt has been transferred to triage nurse, Ardelia Beau.

## 2023-08-05 NOTE — Patient Instructions (Signed)
 It was great to see you!  Please drink 64 oz water daily  Vascular surgeon referral  Update your eye exam and see dermatology this summer for all skin issues and if your groin lesion is also still present  Hold lisinopril  and keep an eye on your blood pressure  You have likely undiagnosed obstructive sleep apnea and need evaluation -- discuss with Dr Nathen Balder at next visit  Let's follow-up in 3 months, sooner if you have concerns.  Take care,  Alexander Iba PA-C

## 2023-08-05 NOTE — Telephone Encounter (Signed)
 Appt today

## 2023-08-05 NOTE — Telephone Encounter (Signed)
 Chief Complaint: Dizziness Symptoms: dizziness Frequency: 7-10 days Pertinent Negatives: Patient denies new numbness/weakness/tingling of arms or legs Disposition: [] ED /[] Urgent Care (no appt availability in office) / [x] Appointment(In office/virtual)/ []  [place] [facility]/ [] Home Care/ [] Refused Recommended Disposition /[] [place] [facility]/ []  Follow-up with PCP Additional Notes: Patient transferred to Nurse Triage from staff at [facility] for scheduling an appt for dizziness. Patient states he has experienced this dizziness in the past but this new onset has worsened. Patient states it occurs when he is changing positions and "loses sight of the horizon" or moves his head around too quickly. Patient states he is still able to walk but has to grab onto items to stabilize himself. Patient denies chest pain, new weakness/numbness of arms or legs past his baseline neuropathy. Patient also states he's been battling a cough from allergies. Patient states he checks his BP roughly once a week and his BP has been good, along with his HR. Patient appt confirmed for today.    Reason for Disposition  [1] MODERATE dizziness (e.g., interferes with normal activities) AND [2] has NOT been evaluated by doctor (or NP/PA) for this  (Exception: Dizziness caused by heat exposure, sudden standing, or poor fluid intake.)  Answer Assessment - Initial Assessment Questions 1. DESCRIPTION: "Describe your dizziness."     Patient states when he looks up and down he gets dizzy 2. LIGHTHEADED: "Do you feel lightheaded?" (e.g., somewhat faint, woozy, weak upon standing)     Patient states he has to grab onto something and steady himself 3. VERTIGO: "Do you feel like either you or the room is spinning or tilting?" (i.e. vertigo)     Patient states feels more vertigo  4. SEVERITY: "How bad is it?"  "Do you feel like you are going to faint?" "Can you stand and walk?"   - MILD: Feels slightly dizzy, but walking  normally.   - MODERATE: Feels unsteady when walking, but not falling; interferes with normal activities (e.g., school, work).   - SEVERE: Unable to walk without falling, or requires assistance to walk without falling; feels like passing out now.      Moderate - has to grab to items 5. ONSET:  "When did the dizziness begin?"     A week to ten days 6. AGGRAVATING FACTORS: "Does anything make it worse?" (e.g., standing, change in head position)     When he looks around too quickly 7. HEART RATE: "Can you tell me your heart rate?" "How many beats in 15 seconds?"  (Note: not all patients can do this)       Yes 8. CAUSE: "What do you think is causing the dizziness?"     Neuropathy and balance problems 9. RECURRENT SYMPTOM: "Have you had dizziness before?" If Yes, ask: "When was the last time?" "What happened that time?"     Yes 10. OTHER SYMPTOMS: "Do you have any other symptoms?" (e.g., fever, chest pain, vomiting, diarrhea, bleeding)       No  Protocols used: Dizziness - Lightheadedness-A-AH

## 2023-08-06 ENCOUNTER — Encounter: Payer: Self-pay | Admitting: Physician Assistant

## 2023-08-23 DIAGNOSIS — M25521 Pain in right elbow: Secondary | ICD-10-CM | POA: Insufficient documentation

## 2023-09-12 ENCOUNTER — Ambulatory Visit

## 2023-09-12 VITALS — BP 128/78 | HR 58 | Temp 98.9°F | Ht 68.0 in | Wt 210.6 lb

## 2023-09-12 DIAGNOSIS — Z0000 Encounter for general adult medical examination without abnormal findings: Secondary | ICD-10-CM

## 2023-09-12 NOTE — Progress Notes (Signed)
 Subjective:   Taylor Berger is a 78 y.o. who presents for a Medicare Wellness preventive visit.  As a reminder, Annual Wellness Visits don't include a physical exam, and some assessments may be limited, especially if this visit is performed virtually. We may recommend an in-person follow-up visit with your provider if needed.  Visit Complete: In person    Persons Participating in Visit: Patient.  AWV Questionnaire: No: Patient Medicare AWV questionnaire was not completed prior to this visit.  Cardiac Risk Factors include: advanced age (>6men, >89 women);hypertension;male gender;obesity (BMI >30kg/m2)     Objective:    Today's Vitals   09/12/23 0956 09/12/23 0957  BP: 128/78   Pulse: (!) 58   Temp: 98.9 F (37.2 C)   SpO2: 92%   Weight: 210 lb 9.6 oz (95.5 kg)   Height: 5' 8 (1.727 m)   PainSc: 3  3    Body mass index is 32.02 kg/m.     09/12/2023   10:00 AM 11/27/2022    2:42 PM 07/23/2022   10:44 AM 05/19/2021   11:14 AM 04/24/2021   10:55 AM 11/17/2020   10:52 AM 05/23/2020    8:02 AM  Advanced Directives  Does Patient Have a Medical Advance Directive? Yes Yes Yes Yes No No No  Type of Estate agent of [place];Living will Living will Healthcare Power of [place];Living will Healthcare Power of Attorney     Copy of Healthcare Power of Attorney in Chart? No - copy requested  No - copy requested No - copy requested     Would patient like information on creating a medical advance directive?     No - Patient declined  No - Patient declined    Current Medications (verified) Outpatient Encounter Medications as of 09/12/2023  Medication Sig   amLODipine  (NORVASC ) 10 MG tablet Take 1 tablet by mouth once daily   atorvastatin  (LIPITOR) 80 MG tablet Take 1 tablet by mouth once daily   cetirizine (ZYRTEC) 10 MG tablet Take 10 mg by mouth daily.   clindamycin  (CLEOCIN -T) 1 % external solution Apply topically 2 (two) times daily as needed.   esomeprazole   (NEXIUM ) 20 MG capsule Take 20 mg by mouth daily at 12 noon.   ezetimibe  (ZETIA ) 10 MG tablet Take 1 tablet (10 mg total) by mouth daily.   FIBER ADULT GUMMIES PO Take 2 each by mouth daily in the afternoon.   fluticasone (FLONASE) 50 MCG/ACT nasal spray Place into both nostrils as needed for allergies or rhinitis.   lisinopril  (ZESTRIL ) 5 MG tablet Take 1 tablet by mouth once daily   Probiotic Product (PROBIOTIC GUMMIES PO) Take 3 each by mouth daily in the afternoon.   rivaroxaban  (XARELTO ) 10 MG TABS tablet Take 1 tablet (10 mg total) by mouth daily.   sertraline  (ZOLOFT ) 25 MG tablet Take 1 tablet (25 mg total) by mouth daily.   valACYclovir  (VALTREX ) 500 MG tablet Take 1 tablet (500 mg total) by mouth daily.   No facility-administered encounter medications on file as of 09/12/2023.    Allergies (verified) Patient has no known allergies.   History: Past Medical History:  Diagnosis Date   Allergy    Seasonal   Aortic aneurysm (HCC)    Depression    DVT (deep venous thrombosis) (HCC) 2020   GERD (gastroesophageal reflux disease)    Goiter    Hyperlipidemia    Hypertension    Lymphoma (HCC) 2020   Follicular, stomach   Neuropathy of both feet  Past Surgical History:  Procedure Laterality Date   CARPAL TUNNEL RELEASE Left    CERVICAL DISCECTOMY  1995   C-7   CERVICAL FUSION  01/1989   C-3 through C-6 / no metal   COLONOSCOPY     ELBOW SURGERY Left    FOOT SURGERY Left 1999   Plantar fascitis   low back fusion  2013   Lumbar & Low Thoracic with metal   SHOULDER ARTHROSCOPY WITH ROTATOR CUFF REPAIR Right 2014   UPPER GASTROINTESTINAL ENDOSCOPY     Family History  Problem Relation Age of Onset   Diabetes Mother    Hypertension Mother    Hyperlipidemia Mother    Heart failure Mother    Leukemia Father    Stroke Father    Esophageal cancer Neg Hx    Colon polyps Neg Hx    Pancreatic cancer Neg Hx    Stomach cancer Neg Hx    Rectal cancer Neg Hx    Social  History   Socioeconomic History   Marital status: Married    Spouse name: Not on file   Number of children: Not on file   Years of education: Not on file   Highest education level: Master's degree (e.g., MA, MS, MEng, MEd, MSW, MBA)  Occupational History   Occupation: retired   Tobacco Use   Smoking status: Never   Smokeless tobacco: Never  Vaping Use   Vaping status: Never Used  Substance and Sexual Activity   Alcohol use: Not Currently   Drug use: Never   Sexual activity: Not Currently  Other Topics Concern   Not on file  Social History Narrative   ** Merged History Encounter **       Moved from [place] to be closer to son Married   Social Drivers of Corporate investment banker Strain: Low Risk  (09/12/2023)   Overall Financial Resource Strain (CARDIA)    Difficulty of Paying Living Expenses: Not hard at all  Food Insecurity: No Food Insecurity (09/12/2023)   Hunger Vital Sign    Worried About Running Out of Food in the Last Year: Never true    Ran Out of Food in the Last Year: Never true  Transportation Needs: No Transportation Needs (09/12/2023)   PRAPARE - Administrator, Civil Service (Medical): No    Lack of Transportation (Non-Medical): No  Physical Activity: Inactive (09/12/2023)   Exercise Vital Sign    Days of Exercise per Week: 0 days    Minutes of Exercise per Session: 0 min  Stress: No Stress Concern Present (09/12/2023)   [employer] of Occupational Health - Occupational Stress Questionnaire    Feeling of Stress: Only a little  Recent Concern: Stress - Stress Concern Present (08/03/2023)   [employer] of Occupational Health - Occupational Stress Questionnaire    Feeling of Stress : To some extent  Social Connections: Moderately Isolated (09/12/2023)   Social Connection and Isolation Panel    Frequency of Communication with Friends and Family: Once a week    Frequency of Social Gatherings with Friends and Family: More than three times a  week    Attends Religious Services: Never    Database administrator or Organizations: No    Attends Engineer, structural: Never    Marital Status: Married    Tobacco Counseling Counseling given: Not Answered    Clinical Intake:  Pre-visit preparation completed: Yes  Pain : 0-10 Pain Score: 3  Pain Type: Chronic  pain Pain Location: Arm Pain Orientation: Right Pain Descriptors / Indicators: Aching Pain Onset: More than a month ago Pain Frequency: Intermittent     BMI - recorded: 32.02 Nutritional Status: BMI > 30  Obese Diabetes: No  Lab Results  Component Value Date   HGBA1C 6.1 05/07/2023   HGBA1C 6.0 11/23/2020   HGBA1C 5.9 12/30/2018     How often do you need to have someone help you when you read instructions, pamphlets, or other written materials from your doctor or pharmacy?: 1 - Never  Interpreter Needed?: No  Information entered by :: Lamont Pilsner, LPN   Activities of Daily Living     09/12/2023    9:59 AM  In your present state of health, do you have any difficulty performing the following activities:  Hearing? 1  Comment slight HOH  Vision? 0  Difficulty concentrating or making decisions? 0  Walking or climbing stairs? 0  Dressing or bathing? 0  Doing errands, shopping? 0  Preparing Food and eating ? N  Using the Toilet? N  In the past six months, have you accidently leaked urine? N  Do you have problems with loss of bowel control? N  Managing your Medications? N  Managing your Finances? N  Housekeeping or managing your Housekeeping? N    Patient Care Team: Alexander Iba, [place] as PCP - General (Physician Assistant) Euell Herrlich, MD as PCP - Cardiology (Cardiology)  I have updated your Care Teams any recent Medical Services you may have received from other providers in the past year.     Assessment:   This is a routine wellness examination for Taylor Berger.  Hearing/Vision screen Hearing Screening - Comments:: Pt stated  Slight HOH  Vision Screening - Comments:: Wears rx glasses - up to date with routine eye exams with [facility] Dr Johnetta Nab     Goals Addressed             This Visit's Progress    Patient Stated       Working on losing weight        Depression Screen     09/12/2023   10:03 AM 09/12/2023   10:01 AM 05/07/2023   11:21 AM 02/11/2023    2:02 PM 12/10/2022   11:48 AM 10/05/2022   10:39 AM 08/20/2022    9:46 AM  PHQ 2/9 Scores  PHQ - 2 Score 0 0 0 3 0 6 1  PHQ- 9 Score   2 12  16 5     Fall Risk     09/12/2023   10:03 AM 12/10/2022   11:48 AM 10/05/2022   10:41 AM 07/19/2022    9:26 AM 05/23/2022    9:34 AM  Fall Risk   Falls in the past year? 0 0 1 0 1  Number falls in past yr: 0 0 0 0 0  Injury with Fall? 0 0 0 0 1  Risk for fall due to : Impaired balance/gait No Fall Risks No Fall Risks Impaired vision Impaired balance/gait  Follow up Falls prevention discussed Falls evaluation completed  Falls prevention discussed Falls evaluation completed    MEDICARE RISK AT HOME:  Medicare Risk at Home Any stairs in or around the home?: Yes If so, are there any without handrails?: No Home free of loose throw rugs in walkways, pet beds, electrical cords, etc?: Yes Adequate lighting in your home to reduce risk of falls?: Yes Life alert?: No Use of a cane, walker or w/c?: No Grab  bars in the bathroom?: Yes Shower chair or bench in shower?: No Elevated toilet seat or a handicapped toilet?: No  TIMED UP AND GO:  Was the test performed?  No  Cognitive Function: 6CIT completed        09/12/2023   10:04 AM 07/23/2022   10:46 AM 05/19/2021   11:18 AM 05/13/2020   11:22 AM  6CIT Screen  What Year? 0 points 0 points 0 points 0 points  What month? 0 points 0 points 0 points 0 points  What time? 0 points 0 points 0 points   Count back from 20 0 points 0 points 0 points 0 points  Months in reverse 0 points 0 points 0 points 0 points  Repeat phrase 0 points 2 points 0 points 2 points   Total Score 0 points 2 points 0 points     Immunizations Immunization History  Administered Date(s) Administered   Fluad Quad(high Dose 65+) 01/23/2022   Influenza, High Dose Seasonal PF 01/13/2020, 01/29/2021, 12/21/2022   Moderna Sars-Covid-2 Vaccination 05/20/2019, 06/17/2019   PFIZER(Purple Top)SARS-COV-2 Vaccination 01/26/2020   PNEUMOCOCCAL CONJUGATE-20 09/21/2020   Pfizer Covid-19 Vaccine Bivalent Booster 63yrs & up 01/15/2021, 01/22/2022   Pfizer(Comirnaty)Fall Seasonal Vaccine 12 years and older 01/08/2022   Pneumococcal Conjugate-13 01/06/2014   Pneumococcal Polysaccharide-23 01/06/2013   Respiratory Syncytial Virus Vaccine,Recomb Aduvanted(Arexvy) 12/07/2021   Tdap 12/10/2015   Zoster Recombinant(Shingrix) 01/27/2019, 04/29/2019    Screening Tests Health Maintenance  Topic Date Due   COVID-19 Vaccine (6 - 2024-25 season) 05/06/2024 (Originally 12/02/2022)   INFLUENZA VACCINE  11/01/2023   Medicare Annual Wellness (AWV)  09/11/2024   DTaP/Tdap/Td (2 - Td or Tdap) 12/09/2025   Pneumococcal Vaccine: 50+ Years  Completed   Hepatitis C Screening  Completed   Zoster Vaccines- Shingrix  Completed   HPV VACCINES  Aged Out   Meningococcal B Vaccine  Aged Out   Colonoscopy  Discontinued    Health Maintenance  There are no preventive care reminders to display for this patient.  Health Maintenance Items Addressed: See Nurse Notes at the end of this note  Additional Screening:  Vision Screening: Recommended annual ophthalmology exams for early detection of glaucoma and other disorders of the eye. Would you like a referral to an eye doctor? No    Dental Screening: Recommended annual dental exams for proper oral hygiene  Community Resource Referral / Chronic Care Management: CRR required this visit?  No   CCM required this visit?  No   Plan:    I have personally reviewed and noted the following in the patient's chart:   Medical and social history Use of  alcohol, tobacco or illicit drugs  Current medications and supplements including opioid prescriptions. Patient is not currently taking opioid prescriptions. Functional ability and status Nutritional status Physical activity Advanced directives List of other physicians Hospitalizations, surgeries, and ER visits in previous 12 months Vitals Screenings to include cognitive, depression, and falls Referrals and appointments  In addition, I have reviewed and discussed with patient certain preventive protocols, quality metrics, and best practice recommendations. A written personalized care plan for preventive services as well as general preventive health recommendations were provided to patient.   Bruno Capri, LPN   9/81/1914   After Visit Summary: (In Person-Declined) Patient declined AVS at this time.  Notes: Nothing significant to report at this time.

## 2023-09-12 NOTE — Patient Instructions (Signed)
 Taylor Berger , Thank you for taking time out of your busy schedule to complete your Annual Wellness Visit with me. I enjoyed our conversation and look forward to speaking with you again next year. I, as well as your care team,  appreciate your ongoing commitment to your health goals. Please review the following plan we discussed and let me know if I can assist you in the future. Your Game plan/ To Do List    Referrals: If you haven't heard from the office you've been referred to, please reach out to them at the phone provided.   Follow up Visits: Next Medicare AWV with our clinical staff: 09/17/24   Have you seen your provider in the last 6 months (3 months if uncontrolled diabetes)? Yes Next Office Visit with your provider: will call to make appt   Clinician Recommendations:  Aim for 30 minutes of exercise or brisk walking, 6-8 glasses of water, and 5 servings of fruits and vegetables each day.       This is a list of the screening recommended for you and due dates:  Health Maintenance  Topic Date Due   Medicare Annual Wellness Visit  07/23/2023   COVID-19 Vaccine (6 - 2024-25 season) 05/06/2024*   Flu Shot  11/01/2023   DTaP/Tdap/Td vaccine (2 - Td or Tdap) 12/09/2025   Pneumococcal Vaccine for age over 71  Completed   Hepatitis C Screening  Completed   Zoster (Shingles) Vaccine  Completed   HPV Vaccine  Aged Out   Meningitis B Vaccine  Aged Out   Colon Cancer Screening  Discontinued  *Topic was postponed. The date shown is not the original due date.    Advanced directives: (Copy Requested) Please bring a copy of your health care power of attorney and living will to the office to be added to your chart at your convenience. You can mail to [facility] [address] [place], [place] [address] or email to [email or web address] Advance Care Planning is important because it:  [x]  Makes sure you receive the medical care that is consistent with your values, goals, and  preferences  [x]  It provides guidance to your family and loved ones and reduces their decisional burden about whether or not they are making the right decisions based on your wishes.  Follow the link provided in your after visit summary or read over the paperwork we have mailed to you to help you started getting your Advance Directives in place. If you need assistance in completing these, please reach out to us  so that we can help you!  See attachments for Preventive Care and Fall Prevention Tips.

## 2023-09-16 ENCOUNTER — Encounter: Payer: Self-pay | Admitting: Physician Assistant

## 2023-09-16 MED ORDER — ATORVASTATIN CALCIUM 80 MG PO TABS: 80.0000 mg | ORAL_TABLET | Freq: Every day | ORAL | 1 refills | Status: DC

## 2023-09-16 MED ORDER — AMLODIPINE BESYLATE 10 MG PO TABS: 10.0000 mg | ORAL_TABLET | Freq: Every day | ORAL | 1 refills | Status: DC

## 2023-09-17 ENCOUNTER — Emergency Department (HOSPITAL_BASED_OUTPATIENT_CLINIC_OR_DEPARTMENT_OTHER)
Admission: EM | Admit: 2023-09-17 | Discharge: 2023-09-17 | Disposition: A | Discharge status: Home / Self Care | Attending: Emergency Medicine | Admitting: Emergency Medicine

## 2023-09-17 ENCOUNTER — Encounter (HOSPITAL_BASED_OUTPATIENT_CLINIC_OR_DEPARTMENT_OTHER): Payer: Self-pay

## 2023-09-17 ENCOUNTER — Other Ambulatory Visit: Payer: Self-pay

## 2023-09-17 DIAGNOSIS — M542 Cervicalgia: Secondary | ICD-10-CM | POA: Insufficient documentation

## 2023-09-17 DIAGNOSIS — Z79899 Other long term (current) drug therapy: Secondary | ICD-10-CM | POA: Insufficient documentation

## 2023-09-17 DIAGNOSIS — Y92410 Unspecified street and highway as the place of occurrence of the external cause: Secondary | ICD-10-CM | POA: Diagnosis not present

## 2023-09-17 DIAGNOSIS — Z7901 Long term (current) use of anticoagulants: Secondary | ICD-10-CM | POA: Diagnosis not present

## 2023-09-17 DIAGNOSIS — I10 Essential (primary) hypertension: Secondary | ICD-10-CM | POA: Insufficient documentation
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
## 2023-09-17 NOTE — ED Notes (Signed)
 FALL RISK BUNDLE IS CURRENTLY IN PLACE

## 2023-09-19 ENCOUNTER — Encounter: Payer: Self-pay | Admitting: Physician Assistant

## 2023-09-19 ENCOUNTER — Ambulatory Visit (INDEPENDENT_AMBULATORY_CARE_PROVIDER_SITE_OTHER): Payer: PRIVATE HEALTH INSURANCE | Admitting: Physician Assistant

## 2023-09-19 DIAGNOSIS — M79604 Pain in right leg: Secondary | ICD-10-CM | POA: Diagnosis not present

## 2023-09-19 DIAGNOSIS — M79652 Pain in left thigh: Secondary | ICD-10-CM

## 2023-09-19 DIAGNOSIS — M21942 Unspecified acquired deformity of hand, left hand: Secondary | ICD-10-CM | POA: Diagnosis not present

## 2023-09-19 NOTE — Patient Instructions (Signed)
 It was great to see you!  Keep me posted on your symptoms    Take care,  Freedom Peddy PA-C
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
## 2023-09-22 ENCOUNTER — Encounter: Payer: Self-pay | Admitting: Internal Medicine

## 2023-09-24 ENCOUNTER — Other Ambulatory Visit: Payer: Self-pay

## 2023-09-24 NOTE — Progress Notes (Signed)
 Medication list updated, pt wants to stop Zetia .

## 2023-09-25 NOTE — Telephone Encounter (Signed)
 Called and spoke to pt; scheduled him for f/u with Dr. Loni on 12/31/23. He stopped the Zetia  3 days ago and feels better. Would like to discuss other options at f/u visit.

## 2023-10-03 ENCOUNTER — Encounter: Payer: Self-pay | Admitting: Nurse Practitioner

## 2023-10-03 ENCOUNTER — Ambulatory Visit (INDEPENDENT_AMBULATORY_CARE_PROVIDER_SITE_OTHER): Admitting: Nurse Practitioner

## 2023-10-03 ENCOUNTER — Other Ambulatory Visit (INDEPENDENT_AMBULATORY_CARE_PROVIDER_SITE_OTHER): Payer: PRIVATE HEALTH INSURANCE

## 2023-10-03 ENCOUNTER — Ambulatory Visit: Payer: Self-pay | Admitting: Nurse Practitioner

## 2023-10-03 VITALS — BP 120/62 | HR 81 | Ht 68.0 in | Wt 212.0 lb

## 2023-10-03 DIAGNOSIS — K219 Gastro-esophageal reflux disease without esophagitis: Secondary | ICD-10-CM

## 2023-10-03 DIAGNOSIS — R194 Change in bowel habit: Secondary | ICD-10-CM

## 2023-10-03 DIAGNOSIS — R1030 Lower abdominal pain, unspecified: Secondary | ICD-10-CM | POA: Diagnosis not present

## 2023-10-03 DIAGNOSIS — Z8572 Personal history of non-Hodgkin lymphomas: Secondary | ICD-10-CM

## 2023-10-03 DIAGNOSIS — R197 Diarrhea, unspecified: Secondary | ICD-10-CM

## 2023-10-03 DIAGNOSIS — Z860100 Personal history of colon polyps, unspecified: Secondary | ICD-10-CM

## 2023-10-03 LAB — CBC WITH DIFFERENTIAL/PLATELET
Basophils Absolute: 0.1 10*3/uL (ref 0.0–0.1)
Basophils Relative: 1 % (ref 0.0–3.0)
Eosinophils Absolute: 0.3 10*3/uL (ref 0.0–0.7)
Eosinophils Relative: 3.9 % (ref 0.0–5.0)
HCT: 39.1 % (ref 39.0–52.0)
Hemoglobin: 13.2 g/dL (ref 13.0–17.0)
Lymphocytes Relative: 21.5 % (ref 12.0–46.0)
Lymphs Abs: 1.5 10*3/uL (ref 0.7–4.0)
MCHC: 33.6 g/dL (ref 30.0–36.0)
MCV: 94.5 fl (ref 78.0–100.0)
Monocytes Absolute: 0.8 10*3/uL (ref 0.1–1.0)
Monocytes Relative: 11.2 % (ref 3.0–12.0)
Neutro Abs: 4.4 10*3/uL (ref 1.4–7.7)
Neutrophils Relative %: 62.4 % (ref 43.0–77.0)
Platelets: 206 10*3/uL (ref 150.0–400.0)
RBC: 4.14 Mil/uL — ABNORMAL LOW (ref 4.22–5.81)
RDW: 13.8 % (ref 11.5–15.5)
WBC: 7.1 10*3/uL (ref 4.0–10.5)

## 2023-10-03 LAB — COMPREHENSIVE METABOLIC PANEL WITH GFR
ALT: 24 U/L (ref 0–53)
AST: 19 U/L (ref 0–37)
Albumin: 4.3 g/dL (ref 3.5–5.2)
Alkaline Phosphatase: 88 U/L (ref 39–117)
BUN: 20 mg/dL (ref 6–23)
CO2: 28 meq/L (ref 19–32)
Calcium: 9.9 mg/dL (ref 8.4–10.5)
Chloride: 107 meq/L (ref 96–112)
Creatinine, Ser: 1.01 mg/dL (ref 0.40–1.50)
GFR: 70.71 mL/min (ref >60.00)
Glucose, Bld: 127 mg/dL — ABNORMAL HIGH (ref 70–99)
Potassium: 3.6 meq/L (ref 3.5–5.1)
Sodium: 141 meq/L (ref 135–145)
Total Bilirubin: 0.7 mg/dL (ref 0.2–1.2)
Total Protein: 6.5 g/dL (ref 6.0–8.3)

## 2023-10-03 LAB — C-REACTIVE PROTEIN: CRP: <1 mg/dL (ref 0.5–20.0)

## 2023-10-03 NOTE — Progress Notes (Signed)
 10/03/2023 Taylor Berger [id number] 1945/09/25   Chief Complaint: Change in bowel pattern, lower abdominal pain  History of Present Illness: Taylor Berger is a 78 year old male with a past medical history of depression, hypertension, hyperlipidemia, DVT on Xarelto , benign thyroid  nodule, neuropathy, follicular lymphoma under surveillance and GERD. He was last seen in office by Dr. Stacia 11/05/2018 due to having constipation with abdominal bloat. He was instructed to increase his dietary fiber as well as fiber Gummies and to take Senna as needed if no BM in 2 to 3 days. He presents today for further evaluation regarding erratic bowel movements for the past month. He describes passing 2 loose stools followed by solid stool with a lot of gas which occurs most days with intermittent episodes of explosive uncontrolled nonbloody diarrhea. Some days he does not pass a BM. He recently started Zetia  and he questioned if this medication contributed to his bowel pattern so he stopped taking it 2-1/2 weeks ago and he is having less explosive diarrhea but continues to have an irregular bowel pattern. He has more loose stools than solid. He also endorses having associated lower abdominal pain for the past month as well. He denies any recent antibiotic use. No specific food triggers.  He eats cereal with Lactaid milk and avoids other dairy products. He drinks decaffeinated tea daily and a diet Coca-Cola every other day. No fevers. No weight loss. GERD symptoms are well-controlled on as Omeprazole 20 mg daily. He underwent an EGD and colonoscopy 11/17/2020. The EGD showed reflux without evidence of Barrett's esophagus, gastritis without H. pylori and benign fundic gland polyps. The colonoscopy identified 2 polyps removed from the colon, path report showed tubular adenomas and hyperplastic polyps. A repeat colonoscopy in 7 years to be considered if appropriate as the patient will be greater than 70 years of age at that  point.     Latest Ref Rng & Units 08/05/2023   12:09 PM 06/19/2023    2:39 PM 05/07/2023   11:41 AM  CBC  WBC 4.0 - 10.5 K/uL 7.0  6.7  7.9   Hemoglobin 13.0 - 17.0 g/dL 86.2  85.7  85.9   Hematocrit 39.0 - 52.0 % 40.6  42.7  42.1   Platelets 150.0 - 400.0 K/uL 227.0  223  255.0         Latest Ref Rng & Units 08/05/2023   12:09 PM 06/19/2023    2:39 PM 05/07/2023   11:41 AM  CMP  Glucose 70 - 99 mg/dL 91  873  99   BUN 6 - 23 mg/dL 22  20  17    Creatinine 0.40 - 1.50 mg/dL 8.98  8.81  8.98   Sodium 135 - 145 mEq/L 142  139  140   Potassium 3.5 - 5.1 mEq/L 3.7  4.1  3.7   Chloride 96 - 112 mEq/L 107  108  106   CO2 19 - 32 mEq/L 28  27  26    Calcium  8.4 - 10.5 mg/dL 9.6  9.6  9.6   Total Protein 6.0 - 8.3 g/dL 6.7  6.7  7.0   Total Bilirubin 0.2 - 1.2 mg/dL 0.7  1.0  1.0   Alkaline Phos 39 - 117 U/L 72  80  70   AST 0 - 37 U/L 18  17  20    ALT 0 - 53 U/L 21  20  24      PAST GI PROCEDURES:  11/17/2020 colonoscopy with hemorrhoids, nonbleeding external and internal,  diverticulosis in the sigmoid and descending colon, one 3 mm polyp in the descending colon, 2 to  mm polyps in the ascending colon otherwise normal.  Polyps showed tubular adenomas and hyperplastic.  Repeat surveillance was recommended in 7 years if you still focusing on preventative health measures at that time.   11/17/2020 EGD with no endoscopic esophageal abnormality to explain patient's dysphagia, esophagus was empirically dilated and gastritis.  Pathology showed gastritis and benign fundic gland polyps with reflux esophagitis.  There was no evidence of Barrett's.  Was recommended that he increase his Nexium  to 40 mg twice daily for 12 weeks, then go back to once daily.   Current Outpatient Medications on File Prior to Visit  Medication Sig Dispense Refill   amLODipine  (NORVASC ) 10 MG tablet Take 1 tablet (10 mg total) by mouth daily. 90 tablet 1   atorvastatin  (LIPITOR) 80 MG tablet Take 1 tablet (80 mg total) by mouth  daily. 90 tablet 1   cetirizine (ZYRTEC) 10 MG tablet Take 10 mg by mouth daily.     clindamycin  (CLEOCIN -T) 1 % external solution Apply topically 2 (two) times daily as needed. 30 mL 1   esomeprazole  (NEXIUM ) 20 MG capsule Take 20 mg by mouth daily at 12 noon.     fluticasone (FLONASE) 50 MCG/ACT nasal spray Place into both nostrils as needed for allergies or rhinitis.     lisinopril  (ZESTRIL ) 5 MG tablet Take 1 tablet by mouth once daily 90 tablet 0   Multiple Vitamin (MULTIVITAMIN) tablet Take 1 tablet by mouth daily.     Probiotic Product (PROBIOTIC GUMMIES PO) Take 3 each by mouth daily in the afternoon.     psyllium (REGULOID) 0.52 g capsule Take 2 capsules by mouth daily.     rivaroxaban  (XARELTO ) 10 MG TABS tablet Take 1 tablet (10 mg total) by mouth daily. 90 tablet 3   sertraline  (ZOLOFT ) 25 MG tablet Take 1 tablet (25 mg total) by mouth daily. 90 tablet 1   valACYclovir  (VALTREX ) 500 MG tablet Take 1 tablet (500 mg total) by mouth daily. 90 tablet 3   No current facility-administered medications on file prior to visit.   No Known Allergies  Current Medications, Allergies, Past Medical History, Past Surgical History, Family History and Social History were reviewed in [employer] record.  Review of Systems:   Constitutional: Negative for fever, sweats, chills or weight loss.  Respiratory: Negative for shortness of breath.   Cardiovascular: Negative for chest pain, palpitations and leg swelling.  Gastrointestinal: See HPI.  Musculoskeletal: Negative for back pain or muscle aches.  Neurological: Negative for dizziness, headaches or paresthesias.   Physical Exam: Blood pressure 120/62, pulse 81, height 5' 8 (1.727 m), weight 212 lb (96.2 kg). Wt Readings from Last 3 Encounters:  10/03/23 212 lb (96.2 kg)  09/19/23 208 lb 8 oz (94.6 kg)  09/17/23 210 lb 8.6 oz (95.5 kg)    General: 78 year old male in no acute distress. Head: Normocephalic and  atraumatic. Eyes: No scleral icterus. Conjunctiva pink . Ears: Normal auditory acuity. Mouth: Dentition intact. No ulcers or lesions.  Lungs: Clear throughout to auscultation. Heart: Regular rate and rhythm, soft murmur. Abdomen: Soft, nondistended. Mild tenderness throughout the lower abdomen without rebound or guarding. No masses or hepatomegaly. Normal bowel sounds x 4 quadrants.  Rectal: Deferred.  Musculoskeletal: Symmetrical with no gross deformities. Extremities: Bilateral LE edema, RLE > LLE (patient stated edema, R > L is chronic).  Neurological: Alert oriented x 4.  No focal deficits.  Psychological: Alert and cooperative. Normal mood and affect  Assessment and Recommendations:  78 year old male with an altered bowel pattern, more loose than solid stools with lower abdominal pain x 1 month - CBC, CMP and CRP - [facility] GI pathogen panel including C. Difficile - CTAP with oral and IV contrast - Patient to contact office if symptoms worsen - Drink a glasses of water daily - Diet as tolerated  History of GERD.  EGD 10/2020 showed reflux without evidence of Barrett's esophagus, gastritis without H. pylori and benign fundic gland polyps.  - Continue Omeprazole 20 mg daily - GERD diet  History of colon polyps. Colonoscopy 10/2020 identified 2 polyps removed from the colon, path report showed tubular adenomas and hyperplastic polyps.  - A repeat colonoscopy 11/2027 years to be considered if appropriate as the patient will be greater than 46 years of age at that point.   History of follicular lymphoma -Continue routine follow-up with hematologist Dr. Norleen Kidney  History of DVT on Xarelto 

## 2023-10-03 NOTE — Patient Instructions (Addendum)
 Your provider has requested that you go to the basement level for lab work before leaving today. Press B on the elevator. The lab is located at the first door on the left as you exit the elevator.  Drink 6- 8 glasses of water daily   You have been scheduled for a CT scan of the abdomen and pelvis at [facility], 1st floor Radiology. You are scheduled on 10/10/23 . You should arrive at 3:30 pm to your appointment time to drink the oral contrast.  Please follow the written instructions below on the day of your exam:   1) Do not eat anything after 11am (4 hours prior to your test)    You may take any medications as prescribed with a small amount of water, if necessary. If you take any of the following medications: METFORMIN, GLUCOPHAGE, GLUCOVANCE, AVANDAMET, RIOMET, FORTAMET, ACTOPLUS MET, JANUMET, GLUMETZA or METAGLIP, you MAY be asked to HOLD this medication 48 hours AFTER the exam.   The purpose of you drinking the oral contrast is to aid in the visualization of your intestinal tract. The contrast solution may cause some diarrhea. Depending on your individual set of symptoms, you may also receive an intravenous injection of x-ray contrast/dye. Plan on being at [facility] for 45 minutes or longer, depending on the type of exam you are having performed.   If you have any questions regarding your exam or if you need to reschedule, you may call [facility] at [phone] between the hours of 8:00 am and 5:00 pm, Monday-Friday.   Your provider has requested that you have an abdominal x ray before leaving today. Please go to the basement floor to our Radiology department for the test.  Your provider has ordered [facility] stool testing for you. You have received a kit from our office today containing all necessary supplies to complete this test. Please carefully read the stool collection instructions provided in the kit before opening the accompanying materials. In addition, be  sure there is a label providing your full name and date of birth on the puritan opti-swab tube that is supplied in the kit (if you do not see a label with this information on your test tube, please make us  aware before test collection!). After completing the test, you should secure the purtian tube into the specimen biohazard bag. The [facility] E-Req sheet (including date and time of specimen collection) should be placed into the outside pocket of the specimen biohazard bag and returned to the [place] lab (basement floor of [facility]) within 3 days of collection. Please make sure to give the specimen to a staff member at the lab. DO NOT leave the specimen on the counter.   If the specimen date and time (can be found in the upper right boxed portion of the sheet) are not filled out on the E-Req sheet, the test will NOT be performed.    It was a pleasure to see you today!  Thank you for trusting me with your gastrointestinal care!

## 2023-10-09 NOTE — Progress Notes (Signed)
 Agree with the assessment and plan as outlined by Alcide Evener, NP.    Zae Kirtz E. Tomasa Rand, MD [facility]

## 2023-10-10 ENCOUNTER — Ambulatory Visit (HOSPITAL_COMMUNITY)
Admission: RE | Admit: 2023-10-10 | Discharge: 2023-10-10 | Disposition: A | Discharge status: Home / Self Care | Source: Ambulatory Visit | Attending: Nurse Practitioner | Admitting: Nurse Practitioner

## 2023-10-10 DIAGNOSIS — R197 Diarrhea, unspecified: Secondary | ICD-10-CM | POA: Insufficient documentation

## 2023-10-10 DIAGNOSIS — R1030 Lower abdominal pain, unspecified: Secondary | ICD-10-CM | POA: Diagnosis present

## 2023-10-10 MED ORDER — IOHEXOL 300 MG/ML  SOLN
100.0000 mL | Freq: Once | INTRAMUSCULAR | Status: AC | PRN
  Administered 2023-10-10: 100 mL via INTRAVENOUS

## 2023-10-10 MED ORDER — IOHEXOL 300 MG/ML  SOLN
30.0000 mL | Freq: Once | INTRAMUSCULAR | Status: AC | PRN
  Administered 2023-10-10: 30 mL via ORAL

## 2023-10-11 NOTE — Telephone Encounter (Signed)
 Patient called and stated that he does not need to speak to anyone that he has received his lab work results online. Please advise.

## 2023-10-20 NOTE — Telephone Encounter (Signed)
 Linda/Dottie, Please contact patient and verify if he completed the [facility] stool culture with C. Diff ( I am working in the hospital this week and will not be in my office to check desk top inbox for these results).  If completed and the results are negative, then ok to send in RX for Xifaxan  550mg  one tab po tid x 14 days and Florastor probiotic one capsule po bid x 2 to 4 weeks for suspected SIBO with irregular bowel movements, constipation, loose stools and gas with lower abd pain. Patient is scheduled to see his oncologist 12/2023.   Dr. Stacia, please review CTAP results, labs and messages above. Let me know if you have other recommendations. Appreciate your input.    CTAP 10/10/2023 showed the following:  Stable left abdominal mesenteric mass, and adjacent sub-cm lymph nodes. Differential diagnosis includes treated lymphoma and chronic mesenteric panniculitis.  No acute findings.  No new or progressive disease.  Colonic diverticulosis, without radiographic evidence of diverticulitis.  Stable mildly enlarged prostate.

## 2023-10-21 MED ORDER — DICYCLOMINE HCL 10 MG PO CAPS: 10.0000 mg | ORAL_CAPSULE | Freq: Two times a day (BID) | ORAL | 0 refills | Status: AC | PRN

## 2023-10-21 MED ORDER — SACCHAROMYCES BOULARDII 250 MG PO CAPS: 250.0000 mg | ORAL_CAPSULE | Freq: Two times a day (BID) | ORAL | 0 refills | Status: DC

## 2023-10-21 MED ORDER — RIFAXIMIN 550 MG PO TABS: 550.0000 mg | ORAL_TABLET | Freq: Three times a day (TID) | ORAL | 0 refills | Status: AC

## 2023-10-21 NOTE — Telephone Encounter (Signed)
 Spoke to patient by phone and advised of CT showing stable mesenteric mass and lymph node which could possibly be related to his treated lymphoma. Advised no acute findings and we would like him to keep his scheduled appointment with oncology to continue follow up on this.   Also advised that GI panel with C Diff completed on 10/08/23 was negative/normal. Therefore, we will treat him for presumed SIBO with Xifaxan  x 14 days and Florastor which have both been sent to the pharmacy. Patient verbalizes understanding.

## 2023-10-21 NOTE — Addendum Note (Signed)
 Addended by: CLAUDENE NAOMIE SAILOR on: 10/21/2023 03:59 PM   Modules accepted: Orders

## 2023-10-21 NOTE — Telephone Encounter (Signed)
 Taylor Berger, ok to send in RX for Dicyclomine  10mg  one tab po bid as needed for abdominal pain/cramping # 30, no refills. Patient to stop Dicyclomine  if he develops any dizziness, lightheadedness or balance issues. THX.

## 2023-10-22 ENCOUNTER — Other Ambulatory Visit (HOSPITAL_COMMUNITY): Payer: Self-pay

## 2023-10-22 ENCOUNTER — Telehealth: Payer: Self-pay

## 2023-10-22 NOTE — Telephone Encounter (Signed)
 Pharmacy Patient Advocate Encounter   Received notification from CoverMyMeds that prior authorization for Xifaxan  550MG  tablets is required/requested.   Insurance verification completed.   The patient is insured through [facility] .   Per test claim: xxx

## 2023-10-22 NOTE — Telephone Encounter (Signed)
 Dottie, can you see if we have any Xifaxan  samples to give patient?  If no samples available, lets see how he responds to Dicyclomine . To consider a course of low dose Flagyl  if we are unable to get samples of Xifaxan  and if no improvement on Dicyclomine . THX.

## 2023-10-22 NOTE — Telephone Encounter (Signed)
 See earlier message I sent Taylor Berger regarding this patient and tx plan.

## 2023-10-23 NOTE — Telephone Encounter (Signed)
 Taylor Berger, if no improvement on Dicyclomine , ok to send in RX for Flagyl  250mg  one tab po bid x 10 days. Florastor probiotic one po bid x 2 to 4 weeks OTC. THX.

## 2023-11-13 ENCOUNTER — Encounter: Payer: Self-pay | Admitting: Physician Assistant

## 2023-11-13 ENCOUNTER — Ambulatory Visit (INDEPENDENT_AMBULATORY_CARE_PROVIDER_SITE_OTHER): Admitting: Physician Assistant

## 2023-11-13 VITALS — BP 140/78 | HR 74 | Temp 98.2°F | Ht 68.0 in | Wt 211.0 lb

## 2023-11-13 DIAGNOSIS — I10 Essential (primary) hypertension: Secondary | ICD-10-CM | POA: Diagnosis not present

## 2023-11-13 DIAGNOSIS — E049 Nontoxic goiter, unspecified: Secondary | ICD-10-CM

## 2023-11-13 DIAGNOSIS — R7309 Other abnormal glucose: Secondary | ICD-10-CM | POA: Diagnosis not present

## 2023-11-13 DIAGNOSIS — M7989 Other specified soft tissue disorders: Secondary | ICD-10-CM

## 2023-11-13 DIAGNOSIS — R0602 Shortness of breath: Secondary | ICD-10-CM | POA: Diagnosis not present

## 2023-11-13 MED ORDER — LISINOPRIL 20 MG PO TABS: 20.0000 mg | ORAL_TABLET | Freq: Every day | ORAL | 1 refills | Status: DC

## 2023-11-13 NOTE — Progress Notes (Signed)
 Taylor Berger is a 78 y.o. male here for a new problem.  History of Present Illness:   Chief Complaint  Patient presents with   Leg Pain    Inner thigh pain L; near groin area; started about 10days; no injury done; but so painful had to walk with a cane; just woke up one day and the pain was there; sharp pain; Also want to discuss new referral to Vascular (have some swelling in both legs R worse)    Discussed the use of AI scribe software for clinical note transcription with the patient, who gave verbal consent to proceed.  History of Present Illness Taylor Berger is a 78 year old male who presents with leg pain.  He experiences sudden onset leg pain without known injury, possibly related to neuropathy, as he sometimes kicks his leg out of bed due to numbness. The pain was severe last week, requiring a cane, but has since improved. No bulge or hernia is present, and he does not recall any heavy lifting. Significant swelling is noted in both legs, with the right leg worse than the left, present for about two to two and a half weeks, described as 'real tight in the calf', causing discomfort. He has been on amlodipine  10 mg daily for a long time and takes lisinopril  5 mg and Xarelto , with compliance.  He experiences shortness of breath, particularly when going up and down stairs, which is a new symptom. No chest pain is present. He attributes some of the shortness of breath to being 'fat and out of shape'.  He has a history of treated lymphoma, stable on recent imaging, and a goiter evaluated in 2022, showing a benign follicular nodule, Bethesda Class II. He has not had recent follow-up for this.  He has a history of stomach issues and lower GI problems, exacerbated by ezetimibe , causing 'uncontrollable diarrhea', leading to discontinuation of the medication. He experiences dizziness, associated with medication for GI issues.  His blood sugars have been 'creeping up', with a history of being  'always prediabetic'. His highest recorded A1c was 6.3, but it was 6.1 six months ago.    Past Medical History:  Diagnosis Date   Allergy    Seasonal   Aortic aneurysm (HCC)    Depression    DVT (deep venous thrombosis) (HCC) 2020   GERD (gastroesophageal reflux disease)    Goiter    Hyperlipidemia    Hypertension    Lymphoma (HCC) 2020   Follicular, stomach   Neuropathy of both feet      Social History   Tobacco Use   Smoking status: Never   Smokeless tobacco: Never  Vaping Use   Vaping status: Never Used  Substance Use Topics   Alcohol use: Not Currently   Drug use: Never    Past Surgical History:  Procedure Laterality Date   CARPAL TUNNEL RELEASE Left    CERVICAL DISCECTOMY  1995   C-7   CERVICAL FUSION  01/1989   C-3 through C-6 / no metal   COLONOSCOPY     ELBOW SURGERY Left    ELBOW SURGERY     FOOT SURGERY Left 1999   Plantar fascitis   low back fusion  2013   Lumbar & Low Thoracic with metal   SHOULDER ARTHROSCOPY WITH ROTATOR CUFF REPAIR Right 2014   UPPER GASTROINTESTINAL ENDOSCOPY      Family History  Problem Relation Age of Onset   Diabetes Mother    Hypertension Mother  Hyperlipidemia Mother    Heart failure Mother    Leukemia Father    Stroke Father    Esophageal cancer Neg Hx    Colon polyps Neg Hx    Pancreatic cancer Neg Hx    Stomach cancer Neg Hx    Rectal cancer Neg Hx     No Known Allergies  Current Medications:   Current Outpatient Medications:    amLODipine  (NORVASC ) 10 MG tablet, Take 1 tablet (10 mg total) by mouth daily., Disp: 90 tablet, Rfl: 1   atorvastatin  (LIPITOR) 80 MG tablet, Take 1 tablet (80 mg total) by mouth daily., Disp: 90 tablet, Rfl: 1   cetirizine (ZYRTEC) 10 MG tablet, Take 10 mg by mouth daily., Disp: , Rfl:    clindamycin  (CLEOCIN -T) 1 % external solution, Apply topically 2 (two) times daily as needed., Disp: 30 mL, Rfl: 1   dicyclomine  (BENTYL ) 10 MG capsule, Take 1 capsule (10 mg total) by  mouth 2 (two) times daily as needed for spasms (abdominal pain)., Disp: 30 capsule, Rfl: 0   esomeprazole  (NEXIUM ) 20 MG capsule, Take 20 mg by mouth daily at 12 noon., Disp: , Rfl:    fluticasone (FLONASE) 50 MCG/ACT nasal spray, Place into both nostrils as needed for allergies or rhinitis., Disp: , Rfl:    lisinopril  (ZESTRIL ) 20 MG tablet, Take 1 tablet (20 mg total) by mouth daily., Disp: 30 tablet, Rfl: 1   Multiple Vitamin (MULTIVITAMIN) tablet, Take 1 tablet by mouth daily., Disp: , Rfl:    Probiotic Product (PROBIOTIC GUMMIES PO), Take 3 each by mouth daily in the afternoon., Disp: , Rfl:    psyllium (REGULOID) 0.52 g capsule, Take 2 capsules by mouth daily., Disp: , Rfl:    rivaroxaban  (XARELTO ) 10 MG TABS tablet, Take 1 tablet (10 mg total) by mouth daily., Disp: 90 tablet, Rfl: 3   saccharomyces boulardii (FLORASTOR) 250 MG capsule, Take 1 capsule (250 mg total) by mouth 2 (two) times daily., Disp: 60 capsule, Rfl: 0   sertraline  (ZOLOFT ) 25 MG tablet, Take 1 tablet (25 mg total) by mouth daily., Disp: 90 tablet, Rfl: 1   valACYclovir  (VALTREX ) 500 MG tablet, Take 1 tablet (500 mg total) by mouth daily., Disp: 90 tablet, Rfl: 3   Review of Systems:   Negative unless otherwise specified per HPI.  Vitals:   Vitals:   11/13/23 1334  BP: (!) 140/78  Pulse: 74  Temp: 98.2 F (36.8 C)  SpO2: 95%  Weight: 211 lb (95.7 kg)  Height: 5' 8 (1.727 m)     Body mass index is 32.08 kg/m.  Physical Exam:   Physical Exam Vitals and nursing note reviewed.  Constitutional:      General: He is not in acute distress.    Appearance: He is well-developed. He is not ill-appearing or toxic-appearing.  Cardiovascular:     Rate and Rhythm: Normal rate and regular rhythm.     Pulses: Normal pulses.     Heart sounds: Normal heart sounds, S1 normal and S2 normal.  Pulmonary:     Effort: Pulmonary effort is normal.     Breath sounds: Normal breath sounds.  Musculoskeletal:     Right lower  leg: 2+ Pitting Edema present.     Left lower leg: 2+ Pitting Edema present.  Skin:    General: Skin is warm and dry.  Neurological:     Mental Status: He is alert.     GCS: GCS eye subscore is 4. GCS verbal subscore is  5. GCS motor subscore is 6.  Psychiatric:        Speech: Speech normal.        Behavior: Behavior normal. Behavior is cooperative.     Assessment and Plan:   Assessment and Plan Assessment & Plan Lower extremity edema and pain Bilateral edema, right worse than left, with pain. I do not suspect DVT as he has had good compliance with rivaroxaban  and he is adamant that his symptom(s) are not similar to past deep vein thrombosis. Possible amlodipine  contribution. Differential includes heart failure, medication side effect, or thyroid  issue. Shortness of breath present. Potential heart failure workup if symptoms persist. - Stop amlodipine . - Increase lisinopril  to 20 mg daily. - Order echocardiogram  - Monitor blood pressure at home every other day and report average readings in 1-2 weeks. - Elevate legs and increase physical activity. - Consider diuretic if labs are normal and swelling persists. - Contact cardiologist or visit ER if symptoms worsen.  Essential hypertension Hypertension managed with amlodipine  and lisinopril . Amlodipine  may contribute to edema. - Stop amlodipine . - Increase lisinopril  to 20 mg daily. - Monitor blood pressure at home every other day and report average readings in 1-2 weeks.  Benign thyroid  nodule (goiter) Benign follicular thyroid  nodule with no recent follow-up. Potential contribution to edema considered. - Review previous thyroid  ultrasound and biopsy results. - Determine need for follow-up ultrasound based on previous findings.  Abnormal glucose (prediabetes) Prediabetes with previous HbA1c of 6.1%. Recent weight gain noted. - Check blood glucose levels.       Lucie Buttner, PA-C

## 2023-11-13 NOTE — Patient Instructions (Signed)
 It was great to see you!  Stop amlodipine  10 mg  Increase your lisinopril  to 20 mg daily   Keep an eye blood pressure -- message me in 1-2 weeks with average readings  We will get an echocardiogram of your heart  If new symptom(s), let me know   Take care,  Milton Streicher PA-C

## 2023-11-14 ENCOUNTER — Other Ambulatory Visit

## 2023-11-15 ENCOUNTER — Encounter: Payer: Self-pay | Admitting: Nurse Practitioner

## 2023-11-18 ENCOUNTER — Ambulatory Visit
Admission: RE | Admit: 2023-11-18 | Discharge: 2023-11-18 | Disposition: A | Discharge status: Home / Self Care | Source: Ambulatory Visit | Attending: Physician Assistant | Admitting: Physician Assistant

## 2023-11-18 DIAGNOSIS — E049 Nontoxic goiter, unspecified: Secondary | ICD-10-CM

## 2023-11-21 ENCOUNTER — Encounter (HOSPITAL_COMMUNITY): Payer: Self-pay

## 2023-11-21 ENCOUNTER — Ambulatory Visit (HOSPITAL_COMMUNITY)
Admission: RE | Admit: 2023-11-21 | Source: Ambulatory Visit | Attending: Physician Assistant | Admitting: Physician Assistant

## 2023-11-26 ENCOUNTER — Ambulatory Visit: Payer: Self-pay | Admitting: Physician Assistant

## 2023-12-03 ENCOUNTER — Other Ambulatory Visit: Payer: Self-pay | Admitting: Physician Assistant

## 2023-12-11 ENCOUNTER — Ambulatory Visit (HOSPITAL_COMMUNITY)
Admission: RE | Admit: 2023-12-11 | Discharge: 2023-12-11 | Disposition: A | Discharge status: Home / Self Care | Source: Ambulatory Visit | Attending: Physician Assistant | Admitting: Physician Assistant

## 2023-12-11 DIAGNOSIS — M7989 Other specified soft tissue disorders: Secondary | ICD-10-CM | POA: Diagnosis present

## 2023-12-11 DIAGNOSIS — R0602 Shortness of breath: Secondary | ICD-10-CM | POA: Insufficient documentation

## 2023-12-11 LAB — ECHOCARDIOGRAM COMPLETE
AR max vel: 2.7 cm2
AV Area VTI: 2.6 cm2
AV Area mean vel: 2.62 cm2
AV Mean grad: 3 mmHg
AV Peak grad: 6 mmHg
Ao pk vel: 1.22 m/s
Area-P 1/2: 2.45 cm2
MV M vel: 1.92 m/s
MV Peak grad: 14.7 mmHg
S' Lateral: 3.07 cm

## 2023-12-16 ENCOUNTER — Ambulatory Visit (INDEPENDENT_AMBULATORY_CARE_PROVIDER_SITE_OTHER): Admitting: Physician Assistant

## 2023-12-16 ENCOUNTER — Ambulatory Visit: Payer: Self-pay | Admitting: Physician Assistant

## 2023-12-16 ENCOUNTER — Encounter: Payer: Self-pay | Admitting: Physician Assistant

## 2023-12-16 VITALS — BP 150/80 | HR 48 | Temp 98.2°F | Ht 68.0 in | Wt 209.2 lb

## 2023-12-16 DIAGNOSIS — R7309 Other abnormal glucose: Secondary | ICD-10-CM

## 2023-12-16 DIAGNOSIS — F329 Major depressive disorder, single episode, unspecified: Secondary | ICD-10-CM

## 2023-12-16 DIAGNOSIS — I10 Essential (primary) hypertension: Secondary | ICD-10-CM

## 2023-12-16 LAB — COMPREHENSIVE METABOLIC PANEL WITH GFR
ALT: 22 U/L (ref 0–53)
AST: 20 U/L (ref 0–37)
Albumin: 4 g/dL (ref 3.5–5.2)
Alkaline Phosphatase: 69 U/L (ref 39–117)
BUN: 19 mg/dL (ref 6–23)
CO2: 26 meq/L (ref 19–32)
Calcium: 9.9 mg/dL (ref 8.4–10.5)
Chloride: 108 meq/L (ref 96–112)
Creatinine, Ser: 0.97 mg/dL (ref 0.40–1.50)
GFR: 74.12 mL/min (ref >60.00)
Glucose, Bld: 148 mg/dL — ABNORMAL HIGH (ref 70–99)
Potassium: 4 meq/L (ref 3.5–5.1)
Sodium: 140 meq/L (ref 135–145)
Total Bilirubin: 1 mg/dL (ref 0.2–1.2)
Total Protein: 6.4 g/dL (ref 6.0–8.3)

## 2023-12-16 LAB — CBC WITH DIFFERENTIAL/PLATELET
Basophils Absolute: 0.1 K/uL (ref 0.0–0.1)
Basophils Relative: 1.1 % (ref 0.0–3.0)
Eosinophils Absolute: 0.2 K/uL (ref 0.0–0.7)
Eosinophils Relative: 3.7 % (ref 0.0–5.0)
HCT: 39.9 % (ref 39.0–52.0)
Hemoglobin: 13.3 g/dL (ref 13.0–17.0)
Lymphocytes Relative: 17.1 % (ref 12.0–46.0)
Lymphs Abs: 0.9 K/uL (ref 0.7–4.0)
MCHC: 33.3 g/dL (ref 30.0–36.0)
MCV: 95.3 fl (ref 78.0–100.0)
Monocytes Absolute: 0.5 K/uL (ref 0.1–1.0)
Monocytes Relative: 9.9 % (ref 3.0–12.0)
Neutro Abs: 3.7 K/uL (ref 1.4–7.7)
Neutrophils Relative %: 68.2 % (ref 43.0–77.0)
Platelets: 201 K/uL (ref 150.0–400.0)
RBC: 4.18 Mil/uL — ABNORMAL LOW (ref 4.22–5.81)
RDW: 13.7 % (ref 11.5–15.5)
WBC: 5.4 K/uL (ref 4.0–10.5)

## 2023-12-16 LAB — TSH: TSH: 2.06 u[IU]/mL (ref 0.35–5.50)

## 2023-12-16 LAB — HEMOGLOBIN A1C: Hgb A1c MFr Bld: 6.3 % (ref 4.6–6.5)

## 2023-12-16 MED ORDER — HYDROCHLOROTHIAZIDE 12.5 MG PO TABS: 12.5000 mg | ORAL_TABLET | Freq: Every day | ORAL | 1 refills | Status: DC

## 2023-12-16 MED ORDER — SERTRALINE HCL 50 MG PO TABS: 50.0000 mg | ORAL_TABLET | Freq: Every day | ORAL | 1 refills | Status: AC

## 2023-12-16 NOTE — Patient Instructions (Addendum)
  VISIT SUMMARY: Today, we discussed your blood pressure, leg swelling, bowel irregularities, and mood. We made some adjustments to your medications and planned some follow-up tests.  YOUR PLAN: LOWER EXTREMITY EDEMA AND PAIN: You have swelling in your legs, with the left leg improving more than the right. -We will start you on hydrochlorothiazide  to see how you tolerate it before possibly combining it with lisinopril . -We will do blood work to check your electrolytes, including potassium and sodium.  ESSENTIAL HYPERTENSION: Your home blood pressure readings are slightly elevated. -We will start you on hydrochlorothiazide  to see how you tolerate it before possibly combining it with lisinopril . -Please continue to monitor your blood pressure at home and report any changes.  ABNORMAL GLUCOSE (PREDIABETES): We need to monitor your glucose levels. -We will do blood work to check your glucose levels. Please complete this today.  IRRITABLE BOWEL SYNDROME: You have irregular stool patterns and dizziness from dicyclomine . -Increase your water and fiber intake. -Continue taking dicyclomine  at night as needed and monitor for dizziness. -We will increase your sertraline  to 50 mg daily, which may cause temporary stomach upset.  DEPRESSION: You are currently on sertraline  25 mg daily and wish to increase the dose. -We will increase your sertraline  to 50 mg daily.                      Contains text generated by [employer].                                 Contains text generated by [employer]bridge.

## 2023-12-16 NOTE — Progress Notes (Signed)
 Taylor Berger is a 78 y.o. male here for a follow up of a pre-existing problem.  History of Present Illness:   Chief Complaint  Patient presents with   Hypertension    Pt c/o increase in blood pressure for the past week, Bp averaging systolic 130's diastolic 90's.    Discussed the use of AI scribe software for clinical note transcription with the patient, who gave verbal consent to proceed.  History of Present Illness Taylor Berger is a 78 year old male with hypertension and gastrointestinal issues who presents for follow-up on blood pressure and bowel irregularities.  Blood pressure readings at home average 140/90 mmHg, with today's reading at 150/80 mmHg. He is on lisinopril  20 mg daily. He stopped amlodiipine as advised to rule out swelling from this. He had echocardiogram on 9/10 that was overall consistent with last echocardiogram. He has follow up with his cardiologist in two weeks. Reports there is no chest pain, shortness of breath, lightheadedness.  He is also due for recheck of his blood sugars -- we attempted to get an Hemoglobin A1c last time he was here but he left before blood work was completed.   He continues to have ongoing depression. Would like increased Zoloft , currently taking 25 mg daily. Reports there is no suicidal ideation/hi.  Past Medical History:  Diagnosis Date   Allergy    Seasonal   Aortic aneurysm (HCC)    Depression    DVT (deep venous thrombosis) (HCC) 2020   GERD (gastroesophageal reflux disease)    Goiter    Hyperlipidemia    Hypertension    Lymphoma (HCC) 2020   Follicular, stomach   Neuropathy of both feet      Social History   Tobacco Use   Smoking status: Never   Smokeless tobacco: Never  Vaping Use   Vaping status: Never Used  Substance Use Topics   Alcohol use: Not Currently   Drug use: Never    Past Surgical History:  Procedure Laterality Date   CARPAL TUNNEL RELEASE Left    CERVICAL DISCECTOMY  1995   C-7   CERVICAL FUSION   01/1989   C-3 through C-6 / no metal   COLONOSCOPY     ELBOW SURGERY Left    ELBOW SURGERY     FOOT SURGERY Left 1999   Plantar fascitis   low back fusion  2013   Lumbar & Low Thoracic with metal   SHOULDER ARTHROSCOPY WITH ROTATOR CUFF REPAIR Right 2014   UPPER GASTROINTESTINAL ENDOSCOPY      Family History  Problem Relation Age of Onset   Diabetes Mother    Hypertension Mother    Hyperlipidemia Mother    Heart failure Mother    Leukemia Father    Stroke Father    Esophageal cancer Neg Hx    Colon polyps Neg Hx    Pancreatic cancer Neg Hx    Stomach cancer Neg Hx    Rectal cancer Neg Hx     No Known Allergies  Current Medications:   Current Outpatient Medications:    amLODipine  (NORVASC ) 10 MG tablet, Take 1 tablet (10 mg total) by mouth daily., Disp: 90 tablet, Rfl: 1   atorvastatin  (LIPITOR) 80 MG tablet, Take 1 tablet (80 mg total) by mouth daily., Disp: 90 tablet, Rfl: 1   cetirizine (ZYRTEC) 10 MG tablet, Take 10 mg by mouth daily., Disp: , Rfl:    clindamycin  (CLEOCIN -T) 1 % external solution, Apply topically 2 (two) times daily as needed.,  Disp: 30 mL, Rfl: 1   dicyclomine  (BENTYL ) 10 MG capsule, Take 1 capsule (10 mg total) by mouth 2 (two) times daily as needed for spasms (abdominal pain)., Disp: 30 capsule, Rfl: 0   esomeprazole  (NEXIUM ) 20 MG capsule, Take 20 mg by mouth daily at 12 noon., Disp: , Rfl:    fluticasone (FLONASE) 50 MCG/ACT nasal spray, Place into both nostrils as needed for allergies or rhinitis., Disp: , Rfl:    hydrochlorothiazide  (HYDRODIURIL ) 12.5 MG tablet, Take 1 tablet (12.5 mg total) by mouth daily., Disp: 30 tablet, Rfl: 1   lisinopril  (ZESTRIL ) 20 MG tablet, TAKE 1 TABLET BY MOUTH DAILY, Disp: 30 tablet, Rfl: 1   Multiple Vitamin (MULTIVITAMIN) tablet, Take 1 tablet by mouth daily., Disp: , Rfl:    Probiotic Product (PROBIOTIC GUMMIES PO), Take 3 each by mouth daily in the afternoon., Disp: , Rfl:    psyllium (REGULOID) 0.52 g capsule,  Take 2 capsules by mouth daily., Disp: , Rfl:    rivaroxaban  (XARELTO ) 10 MG TABS tablet, Take 1 tablet (10 mg total) by mouth daily., Disp: 90 tablet, Rfl: 3   sertraline  (ZOLOFT ) 50 MG tablet, Take 1 tablet (50 mg total) by mouth daily., Disp: 90 tablet, Rfl: 1   valACYclovir  (VALTREX ) 500 MG tablet, Take 1 tablet (500 mg total) by mouth daily., Disp: 90 tablet, Rfl: 3   Review of Systems:   Negative unless otherwise specified per HPI.  Vitals:   Vitals:   12/16/23 1042  BP: (!) 150/80  Pulse: (!) 48  Temp: 98.2 F (36.8 C)  TempSrc: Temporal  SpO2: 94%  Weight: 209 lb 4 oz (94.9 kg)  Height: 5' 8 (1.727 m)     Body mass index is 31.82 kg/m.  Physical Exam:   Physical Exam Vitals and nursing note reviewed.  Constitutional:      General: He is not in acute distress.    Appearance: He is well-developed. He is not ill-appearing or toxic-appearing.  Cardiovascular:     Rate and Rhythm: Normal rate and regular rhythm.     Pulses: Normal pulses.     Heart sounds: Normal heart sounds, S1 normal and S2 normal.  Pulmonary:     Effort: Pulmonary effort is normal.     Breath sounds: Normal breath sounds.  Skin:    General: Skin is warm and dry.  Neurological:     Mental Status: He is alert.     GCS: GCS eye subscore is 4. GCS verbal subscore is 5. GCS motor subscore is 6.  Psychiatric:        Speech: Speech normal.        Behavior: Behavior normal. Behavior is cooperative.     Assessment and Plan:   Assessment and Plan Assessment & Plan Essential hypertension Home blood pressure readings slightly elevated at 140s/90s. On lisinopril . - Initiate hydrochlorothiazide  12.5 mg daily to assess tolerance before considering combination with lisinopril . - Monitor blood pressure at home and report changes.  Elevated glucose (prediabetes) Blood work pending for glucose monitoring. Patient agreeable to complete today. - Order blood work to assess glucose levels.  Reactive  Depression On sertraline  25 mg daily, wishes to increase dose. Does not attribute bowel issues to sertraline . - Increase sertraline  to 50 mg daily.     Lucie Buttner, PA-C

## 2023-12-19 ENCOUNTER — Other Ambulatory Visit: Payer: Self-pay | Admitting: Hematology and Oncology

## 2023-12-19 ENCOUNTER — Telehealth: Payer: Self-pay | Admitting: Hematology and Oncology

## 2023-12-19 DIAGNOSIS — C8203 Follicular lymphoma grade I, intra-abdominal lymph nodes: Secondary | ICD-10-CM

## 2023-12-20 ENCOUNTER — Inpatient Hospital Stay: Admitting: Hematology and Oncology

## 2023-12-20 ENCOUNTER — Inpatient Hospital Stay

## 2023-12-31 ENCOUNTER — Ambulatory Visit: Attending: Internal Medicine | Admitting: Internal Medicine

## 2023-12-31 ENCOUNTER — Encounter: Payer: Self-pay | Admitting: Internal Medicine

## 2023-12-31 VITALS — BP 138/68 | HR 55 | Ht 68.0 in | Wt 212.0 lb

## 2023-12-31 DIAGNOSIS — E785 Hyperlipidemia, unspecified: Secondary | ICD-10-CM | POA: Insufficient documentation

## 2023-12-31 DIAGNOSIS — I82409 Acute embolism and thrombosis of unspecified deep veins of unspecified lower extremity: Secondary | ICD-10-CM | POA: Insufficient documentation

## 2023-12-31 DIAGNOSIS — I2510 Atherosclerotic heart disease of native coronary artery without angina pectoris: Secondary | ICD-10-CM | POA: Insufficient documentation

## 2023-12-31 DIAGNOSIS — I700 Atherosclerosis of aorta: Secondary | ICD-10-CM | POA: Diagnosis present

## 2023-12-31 DIAGNOSIS — I10 Essential (primary) hypertension: Secondary | ICD-10-CM | POA: Diagnosis present

## 2023-12-31 NOTE — Patient Instructions (Signed)
 Medication Instructions:  Your physician recommends that you continue on your current medications as directed. Please refer to the Current Medication list given to you today.  *If you need a refill on your cardiac medications before your next appointment, please call your pharmacy*  Lab Work: None ordered If you have labs (blood work) drawn today and your tests are completely normal, you will receive your results only by: MyChart Message (if you have MyChart) OR A paper copy in the mail If you have any lab test that is abnormal or we need to change your treatment, we will call you to review the results.  Follow-Up: At [facility], you and your health needs are our priority.  As part of our continuing mission to provide you with exceptional heart care, our providers are all part of one team.  This team includes your primary Cardiologist (physician) and Advanced Practice Providers or APPs (Physician Assistants and Nurse Practitioners) who all work together to provide you with the care you need, when you need it.  Your next appointment:   6 month(s)  Provider:   Gayatri A Acharya, MD

## 2023-12-31 NOTE — Progress Notes (Signed)
 Cardiology Office Note:  .   Date:  12/31/2023  ID:  Taylor Berger, DOB Dec 15, 1945, MRN [id number] PCP: Job Lukes, PA  [facility] Providers Cardiologist:  Soyla DELENA Merck, MD    History of Present Illness: .   Taylor Berger is a 78 y.o. male.  Discussed the use of AI scribe software for clinical note transcription with the patient, who gave verbal consent to proceed.  History of Present Illness Taylor Berger is a 78 year old male with coronary artery disease and hypertension who presents for a follow-up visit.  He has nonobstructive coronary artery disease with moderate blockages. A coronary CTA in January 2025 showed mild nonobstructive CAD with a total plaque volume of 537 cubic millimeters and aortic atherosclerosis. He is on atorvastatin  80 mg daily and Xarelto  10 mg daily.  Hypertension is managed with amlodipine  10 mg daily, lisinopril  5 mg daily, and hydrochlorothiazide  12.5 mg daily. Leg swelling improved after temporarily discontinuing amlodipine , but blood pressure increased, necessitating its reintroduction. The swelling is currently better.  He has a history of deep vein thrombosis and continues Xarelto  10 mg daily without bleeding issues. He experienced intolerance to Zetia  due to severe diarrhea and has discontinued its use.    ROS: negative except per HPI above.  Studies Reviewed: .        Results RADIOLOGY Coronary CTA: Mild nonobstructive CAD, total plaque volume 537 cubic millimeters, aortic atherosclerosis (04/2023) Thyroid  ultrasound: No significant interval change of previously biopsied right middle gland nodule, decreasing size of small nodule on right upper gland (11/2023)  DIAGNOSTIC EKG: Sinus bradycardia, first degree AV block  Risk Assessment/Calculations:       Physical Exam:   VS:  BP 138/68 (BP Location: Left Arm, Patient Position: Sitting)   Pulse (!) 55   Ht 5' 8 (1.727 m)   Wt 212 lb (96.2 kg)   SpO2 97%   BMI 32.23 kg/m     Wt Readings from Last 3 Encounters:  12/31/23 212 lb (96.2 kg)  12/16/23 209 lb 4 oz (94.9 kg)  11/13/23 211 lb (95.7 kg)     Physical Exam GENERAL: Alert, cooperative, well developed, no acute distress. HEENT: Normocephalic, normal oropharynx, moist mucous membranes. CHEST: Clear to auscultation bilaterally, no wheezes, rhonchi, or crackles. CARDIOVASCULAR: Normal heart rate and rhythm, S1 and S2 normal without murmurs. ABDOMEN: Soft, non-tender, non-distended, without organomegaly, normal bowel sounds. EXTREMITIES: No cyanosis or edema. NEUROLOGICAL: Cranial nerves grossly intact, moves all extremities without gross motor or sensory deficit.   ASSESSMENT AND PLAN: .    Assessment and Plan Assessment & Plan Nonobstructive coronary artery disease with aortic atherosclerosis Mild nonobstructive CAD with total plaque volume of 537 mm and aortic atherosclerosis. Managed with atorvastatin  80 mg daily. Cholesterol levels are good. Ezetimibe  not tolerated due to diarrhea.  - Continue atorvastatin  80 mg daily. - Discussed PCSK9 inhibitors as future option for aggressive treatment. He will consider.  Hyperlipidemia Managed with atorvastatin  80 mg daily. Ezetimibe  not tolerated. Cholesterol levels are good, treatment focuses on plaque stabilization. - Continue atorvastatin  80 mg daily.  Hypertension Managed with amlodipine , lisinopril , and hydrochlorothiazide . Lisinopril  reduced to 5 mg daily. No cough. - Continue amlodipine  10 mg daily. - Continue lisinopril  5 mg daily. - Continue hydrochlorothiazide  12.5 mg daily. - Consider reducing amlodipine  and increasing hydrochlorothiazide  if swelling recurs.  Chronic right lower extremity deep vein thrombosis with residual symptoms DVT in right leg with residual swelling. On Xarelto  10 mg daily, no bleeding issues. - Continue  Xarelto  10 mg daily.      Soyla Merck, MD, FACC

## 2024-01-10 ENCOUNTER — Inpatient Hospital Stay: Admitting: Hematology and Oncology

## 2024-01-10 ENCOUNTER — Inpatient Hospital Stay: Attending: Hematology and Oncology

## 2024-01-10 VITALS — BP 138/70 | HR 71 | Temp 98.3°F | Resp 15 | Wt 207.2 lb

## 2024-01-10 DIAGNOSIS — Z7901 Long term (current) use of anticoagulants: Secondary | ICD-10-CM | POA: Diagnosis not present

## 2024-01-10 DIAGNOSIS — C8200 Follicular lymphoma grade I, unspecified site: Secondary | ICD-10-CM | POA: Diagnosis present

## 2024-01-10 DIAGNOSIS — I82411 Acute embolism and thrombosis of right femoral vein: Secondary | ICD-10-CM | POA: Diagnosis not present

## 2024-01-10 DIAGNOSIS — C8203 Follicular lymphoma grade I, intra-abdominal lymph nodes: Secondary | ICD-10-CM | POA: Diagnosis not present

## 2024-01-10 LAB — CBC WITH DIFFERENTIAL (CANCER CENTER ONLY)
Abs Immature Granulocytes: 0.05 K/uL (ref 0.00–0.07)
Basophils Absolute: 0.1 K/uL (ref 0.0–0.1)
Basophils Relative: 1 %
Eosinophils Absolute: 0.4 K/uL (ref 0.0–0.5)
Eosinophils Relative: 5 %
HCT: 41.8 % (ref 39.0–52.0)
Hemoglobin: 14.5 g/dL (ref 13.0–17.0)
Immature Granulocytes: 1 %
Lymphocytes Relative: 20 %
Lymphs Abs: 1.7 K/uL (ref 0.7–4.0)
MCH: 32.1 pg (ref 26.0–34.0)
MCHC: 34.7 g/dL (ref 30.0–36.0)
MCV: 92.5 fL (ref 80.0–100.0)
Monocytes Absolute: 0.8 K/uL (ref 0.1–1.0)
Monocytes Relative: 10 %
Neutro Abs: 5.5 K/uL (ref 1.7–7.7)
Neutrophils Relative %: 63 %
Platelet Count: 235 K/uL (ref 150–400)
RBC: 4.52 MIL/uL (ref 4.22–5.81)
RDW: 13.1 % (ref 11.5–15.5)
WBC Count: 8.5 K/uL (ref 4.0–10.5)
nRBC: 0 % (ref 0.0–0.2)

## 2024-01-10 LAB — CMP (CANCER CENTER ONLY)
ALT: 25 U/L (ref 0–44)
AST: 21 U/L (ref 15–41)
Albumin: 4.2 g/dL (ref 3.5–5.0)
Alkaline Phosphatase: 80 U/L (ref 38–126)
Anion gap: 4 — ABNORMAL LOW (ref 5–15)
BUN: 19 mg/dL (ref 8–23)
CO2: 30 mmol/L (ref 22–32)
Calcium: 10.6 mg/dL — ABNORMAL HIGH (ref 8.9–10.3)
Chloride: 107 mmol/L (ref 98–111)
Creatinine: 1.11 mg/dL (ref 0.61–1.24)
GFR, Estimated: >60 mL/min (ref >60)
Glucose, Bld: 167 mg/dL — ABNORMAL HIGH (ref 70–99)
Potassium: 3.7 mmol/L (ref 3.5–5.1)
Sodium: 141 mmol/L (ref 135–145)
Total Bilirubin: 1.1 mg/dL (ref 0.0–1.2)
Total Protein: 7.1 g/dL (ref 6.5–8.1)

## 2024-01-10 LAB — LACTATE DEHYDROGENASE: LDH: 141 U/L (ref 98–192)

## 2024-01-10 NOTE — Progress Notes (Signed)
 [facility] [phone]   [phone]  PROGRESS NOTE  Patient Care Team: Job Lukes, [place] as PCP - General (Physician Assistant) Loni Soyla LABOR, MD as PCP - Cardiology (Cardiology)  Hematological/Oncological History # Follicular Lymphoma Stage I Grade IA 1) 05/20/2019: CT scan showed stable mesenteric nodule, largest 4.2 cm and mild periaortic lymphadenopathy.  2) 06/04/2019: biopsy of abdominal lymph node showed grade 1 follicular lymphoma. Observation recommended by his oncologist at [facility] in [place], [place].  3) 03/14/2020: establish care with Dr. Federico  4) 04/13/2020: CT scan showed stable mesenteric lymphadenopathy, measuring 3.9 x 1.9 cm. 5) 11/17/2020: presented to ED for abdominal discomfort. Underwent CT A/P and found to have relatively stable lymph node measuring 4.3 x 2.3 cm.  6) 08/01/2021: CT Ab/Pelvis ordered for LLQ pain. Fidings show mesenteric lymph node conglomerate, largest component measuring up to 4.3 x 2.2 cm.  HISTORY OF PRESENTING ILLNESS:  Taylor Berger 78 y.o. male with medical history significant for HTN, HLD, DVT ,GERD, and follicular lymphoma (diagnosed 06/2019 and under observation) presents for a follow up visit for follicular lymphoma and recurrent RLE DVT.   On exam today Taylor Berger notes he has been well overall in the interim since our last visit with exception of some bowel issues.  He is having diarrheas and regular stools but is starting to work itself out.  He had a CT scan which showed no concerning bowel abnormalities but fortunately also showed his lymphoma was stable.  He reports has had no issues with fevers, chills, sweats.  He reports that he always feels cold, particular in his back at night.  He denies any bumps or lumps concerning for lymphadenopathy.  He reports that his energy levels are good but he does get easily winded.  He is taking his Xarelto  faithfully and has had no issues  with bleeding or dark stools.  He does occasionally have some bruising.  He notes that he gets his Xarelto  from [place] and pay $60 for a month supply.  Overall he feels well and has no questions concerns or complaints today.  He has no signs or symptoms concerning for recurrent VTE or progression of his follicular lymphoma.  Full 10 point ROS is otherwise negative.   MEDICAL HISTORY:  Past Medical History:  Diagnosis Date   Allergy    Seasonal   Aortic aneurysm    Depression    DVT (deep venous thrombosis) (HCC) 2020   GERD (gastroesophageal reflux disease)    Goiter    Hyperlipidemia    Hypertension    Lymphoma (HCC) 2020   Follicular, stomach   Neuropathy of both feet     SURGICAL HISTORY: Past Surgical History:  Procedure Laterality Date   CARPAL TUNNEL RELEASE Left    CERVICAL DISCECTOMY  1995   C-7   CERVICAL FUSION  01/1989   C-3 through C-6 / no metal   COLONOSCOPY     ELBOW SURGERY Left    ELBOW SURGERY     FOOT SURGERY Left 1999   Plantar fascitis   low back fusion  2013   Lumbar & Low Thoracic with metal   SHOULDER ARTHROSCOPY WITH ROTATOR CUFF REPAIR Right 2014   UPPER GASTROINTESTINAL ENDOSCOPY      SOCIAL HISTORY: Social History   Socioeconomic History   Marital status: Married    Spouse name: Not on file   Number of children: Not on file   Years of education: Not on file  Highest education level: Master's degree (e.g., MA, MS, MEng, MEd, MSW, MBA)  Occupational History   Occupation: retired   Tobacco Use   Smoking status: Never   Smokeless tobacco: Never  Vaping Use   Vaping status: Never Used  Substance and Sexual Activity   Alcohol use: Not Currently   Drug use: Never   Sexual activity: Not Currently  Other Topics Concern   Not on file  Social History Narrative   ** Merged History Encounter **       Moved from [facility] to be closer to son Married   Social Drivers of Corporate investment banker Strain: Low Risk  (09/16/2023)   Overall  Financial Resource Strain (CARDIA)    Difficulty of Paying Living Expenses: Not hard at all  Food Insecurity: No Food Insecurity (09/16/2023)   Hunger Vital Sign    Worried About Running Out of Food in the Last Year: Never true    Ran Out of Food in the Last Year: Never true  Transportation Needs: No Transportation Needs (09/16/2023)   PRAPARE - Administrator, Civil Service (Medical): No    Lack of Transportation (Non-Medical): No  Physical Activity: Insufficiently Active (09/16/2023)   Exercise Vital Sign    Days of Exercise per Week: 1 day    Minutes of Exercise per Session: 20 min  Stress: Stress Concern Present (09/16/2023)   [employer] of Occupational Health - Occupational Stress Questionnaire    Feeling of Stress: Rather much  Social Connections: Socially Isolated (09/16/2023)   Social Connection and Isolation Panel    Frequency of Communication with Friends and Family: Never    Frequency of Social Gatherings with Friends and Family: Once a week    Attends Religious Services: Never    Database administrator or Organizations: No    Attends Engineer, structural: Not on file    Marital Status: Married  Catering manager Violence: Not At Risk (09/12/2023)   Humiliation, Afraid, Rape, and Kick questionnaire    Fear of Current or Ex-Partner: No    Emotionally Abused: No    Physically Abused: No    Sexually Abused: No    FAMILY HISTORY: Family History  Problem Relation Age of Onset   Diabetes Mother    Hypertension Mother    Hyperlipidemia Mother    Heart failure Mother    Leukemia Father    Stroke Father    Esophageal cancer Neg Hx    Colon polyps Neg Hx    Pancreatic cancer Neg Hx    Stomach cancer Neg Hx    Rectal cancer Neg Hx     ALLERGIES:  has no known allergies.  MEDICATIONS:  Current Outpatient Medications  Medication Sig Dispense Refill   amLODipine  (NORVASC ) 10 MG tablet Take 1 tablet (10 mg total) by mouth daily. 90 tablet 1    atorvastatin  (LIPITOR) 80 MG tablet Take 1 tablet (80 mg total) by mouth daily. 90 tablet 1   cetirizine (ZYRTEC) 10 MG tablet Take 10 mg by mouth daily.     clindamycin  (CLEOCIN -T) 1 % external solution Apply topically 2 (two) times daily as needed. 30 mL 1   dicyclomine  (BENTYL ) 10 MG capsule Take 1 capsule (10 mg total) by mouth 2 (two) times daily as needed for spasms (abdominal pain). 30 capsule 0   esomeprazole  (NEXIUM ) 20 MG capsule Take 20 mg by mouth daily at 12 noon.     fluticasone (FLONASE) 50 MCG/ACT nasal spray Place  into both nostrils as needed for allergies or rhinitis.     hydrochlorothiazide  (HYDRODIURIL ) 12.5 MG tablet Take 1 tablet (12.5 mg total) by mouth daily. 30 tablet 1   lisinopril  (ZESTRIL ) 5 MG tablet Take 5 mg by mouth daily.     Multiple Vitamin (MULTIVITAMIN) tablet Take 1 tablet by mouth daily.     Probiotic Product (PROBIOTIC GUMMIES PO) Take 3 each by mouth daily in the afternoon.     psyllium (REGULOID) 0.52 g capsule Take 2 capsules by mouth daily.     rivaroxaban  (XARELTO ) 10 MG TABS tablet Take 1 tablet (10 mg total) by mouth daily. 90 tablet 3   sertraline  (ZOLOFT ) 50 MG tablet Take 1 tablet (50 mg total) by mouth daily. 90 tablet 1   valACYclovir  (VALTREX ) 500 MG tablet Take 1 tablet (500 mg total) by mouth daily. 90 tablet 3   No current facility-administered medications for this visit.    REVIEW OF SYSTEMS:   Constitutional: ( - ) fevers, ( - )  chills , ( - ) night sweats Eyes: ( - ) blurriness of vision, ( - ) double vision, ( - ) watery eyes Ears, nose, mouth, throat, and face: ( - ) mucositis, ( - ) sore throat Respiratory: ( - ) cough, ( - ) dyspnea, ( - ) wheezes Cardiovascular: ( - ) palpitation, ( - ) chest discomfort, ( - ) lower extremity swelling Gastrointestinal:  ( - ) nausea, ( - ) heartburn, ( - ) change in bowel habits Skin: ( - ) abnormal skin rashes Lymphatics: ( - ) new lymphadenopathy, ( - ) easy bruising Neurological: ( - )  tingling, ( - ) new weaknesses. + chronic neuropathy in lower extremities Behavioral/Psych: ( - ) mood change, ( - ) new changes  All other systems were reviewed with the patient and are negative.  PHYSICAL EXAMINATION: ECOG PERFORMANCE STATUS: 0 - Asymptomatic  Vitals:   01/10/24 0932  BP: 138/70  Pulse: 71  Resp: 15  Temp: 98.3 F (36.8 C)  SpO2: 90%         Filed Weights   01/10/24 0932  Weight: 207 lb 3.2 oz (94 kg)        GENERAL: well appearing elderly Caucasian male, appears younger than stated age, in NAD  SKIN: skin color, texture, turgor are normal, no rashes or significant lesions EYES: conjunctiva are pink and non-injected, sclera clear LUNGS: clear to auscultation and percussion with normal breathing effort HEART: regular rate & rhythm and no murmurs. No LE edema or erythema. Musculoskeletal: no cyanosis of digits and no clubbing  PSYCH: alert & oriented x 3, fluent speech NEURO: no focal motor/sensory deficits  LABORATORY DATA:  I have reviewed the data as listed    Latest Ref Rng & Units 01/10/2024    9:13 AM 12/16/2023   11:36 AM 10/03/2023   10:01 AM  CBC  WBC 4.0 - 10.5 K/uL 8.5  5.4  7.1   Hemoglobin 13.0 - 17.0 g/dL 85.4  86.6  86.7   Hematocrit 39.0 - 52.0 % 41.8  39.9  39.1   Platelets 150 - 400 K/uL 235  201.0  206.0        Latest Ref Rng & Units 01/10/2024    9:13 AM 12/16/2023   11:36 AM 10/03/2023   10:01 AM  CMP  Glucose 70 - 99 mg/dL 832  851  872   BUN 8 - 23 mg/dL 19  19  20    Creatinine  0.61 - 1.24 mg/dL 8.88  9.02  8.98   Sodium 135 - 145 mmol/L 141  140  141   Potassium 3.5 - 5.1 mmol/L 3.7  4.0  3.6   Chloride 98 - 111 mmol/L 107  108  107   CO2 22 - 32 mmol/L 30  26  28    Calcium  8.9 - 10.3 mg/dL 89.3  9.9  9.9   Total Protein 6.5 - 8.1 g/dL 7.1  6.4  6.5   Total Bilirubin 0.0 - 1.2 mg/dL 1.1  1.0  0.7   Alkaline Phos 38 - 126 U/L 80  69  88   AST 15 - 41 U/L 21  20  19    ALT 0 - 44 U/L 25  22  24        PATHOLOGY:     RADIOGRAPHIC STUDIES: I have personally reviewed the radiological images as listed and agreed with the findings in the report. ECHOCARDIOGRAM COMPLETE Result Date: 12/11/2023    ECHOCARDIOGRAM REPORT   Patient Name:   Taylor Berger     Date of Exam: 12/11/2023 Medical Rec #:  [id number]       Height:       68.0 in Accession #:    [id number]      Weight:       211.0 lb Date of Birth:  [birth year]      BSA:          2.091 m Patient Age:    77 years        BP:           122/69 mmHg Patient Gender: M               HR:           52 bpm. Exam Location:  [address] Procedure: 2D Echo, Cardiac Doppler and Color Doppler (Both Spectral and Color            Flow Doppler were utilized during procedure). Indications:    Leg swelling [M79.89 (ICD-10-CM)]; SOB (shortness of breath) on                 exertion [R06.02 (ICD-10-CM)]  History:        Patient has prior history of Echocardiogram examinations, most                 recent 04/29/2020. Risk Factors:Hypertension and Non-Smoker.  Sonographer:    Rosaline Fujisawa MHA, RDMS, RVT, RDCS Referring Phys: [id number] [facility]  Sonographer Comments: No subcostal window. IMPRESSIONS  1. Left ventricular ejection fraction, by estimation, is 55 to 60%. The left ventricle has normal function. The left ventricle has no regional wall motion abnormalities. There is mild concentric left ventricular hypertrophy. Left ventricular diastolic parameters are consistent with Grade I diastolic dysfunction (impaired relaxation). The average left ventricular global longitudinal strain is -16.9 %.  2. Right ventricular systolic function is normal. The right ventricular size is normal.  3. The mitral valve is normal in structure. Trivial mitral valve regurgitation. No evidence of mitral stenosis.  4. The aortic valve is tricuspid. Aortic valve regurgitation is not visualized. No aortic stenosis is present.  5. The inferior vena cava is normal in size with greater than  50% respiratory variability, suggesting right atrial pressure of 3 mmHg. FINDINGS  Left Ventricle: Left ventricular ejection fraction, by estimation, is 55 to 60%. The left ventricle has normal function. The left ventricle has no regional wall motion abnormalities. The average left ventricular global  longitudinal strain is -16.9 %. Strain was performed and the global longitudinal strain is indeterminate. 3D ejection fraction reviewed and evaluated as part of the interpretation. Alternate measurement of EF is felt to be most reflective of LV function. The left ventricular internal cavity size was normal in size. There is mild concentric left ventricular hypertrophy. Left ventricular diastolic parameters are consistent with Grade I diastolic dysfunction (impaired relaxation). Indeterminate filling pressures. Right Ventricle: The right ventricular size is normal. No increase in right ventricular wall thickness. Right ventricular systolic function is normal. Left Atrium: Left atrial size was normal in size. Right Atrium: Right atrial size was normal in size. Pericardium: There is no evidence of pericardial effusion. Mitral Valve: The mitral valve is normal in structure. Trivial mitral valve regurgitation. No evidence of mitral valve stenosis. Tricuspid Valve: The tricuspid valve is normal in structure. Tricuspid valve regurgitation is trivial. No evidence of tricuspid stenosis. Aortic Valve: The aortic valve is tricuspid. Aortic valve regurgitation is not visualized. No aortic stenosis is present. Aortic valve mean gradient measures 3.0 mmHg. Aortic valve peak gradient measures 6.0 mmHg. Aortic valve area, by VTI measures 2.60 cm. Pulmonic Valve: The pulmonic valve was normal in structure. Pulmonic valve regurgitation is not visualized. No evidence of pulmonic stenosis. Aorta: The aortic root is normal in size and structure. Venous: The inferior vena cava is normal in size with greater than 50% respiratory variability,  suggesting right atrial pressure of 3 mmHg. IAS/Shunts: No atrial level shunt detected by color flow Doppler. Additional Comments: 3D was performed not requiring image post processing on an independent workstation and was indeterminate.  LEFT VENTRICLE PLAX 2D LVIDd:         4.38 cm   Diastology LVIDs:         3.07 cm   LV e' medial:    4.57 cm/s LV PW:         1.17 cm   LV E/e' medial:  13.2 LV IVS:        1.32 cm   LV e' lateral:   7.18 cm/s LVOT diam:     2.06 cm   LV E/e' lateral: 8.4 LV SV:         75 LV SV Index:   36        2D Longitudinal Strain LVOT Area:     3.33 cm  2D Strain GLS (A4C):   -14.8 %                          2D Strain GLS (A3C):   -17.0 %                          2D Strain GLS (A2C):   -19.0 %                          2D Strain GLS Avg:     -16.9 %                           3D Volume EF:                          3D EF:        49 %  LV EDV:       183 ml                          LV ESV:       93 ml                          LV SV:        91 ml RIGHT VENTRICLE RV Basal diam:  3.54 cm RV Mid diam:    3.45 cm RV S prime:     13.60 cm/s TAPSE (M-mode): 2.0 cm LEFT ATRIUM             Index        RIGHT ATRIUM           Index LA diam:        2.96 cm 1.42 cm/m   RA Area:     14.10 cm LA Vol (A2C):   37.1 ml 17.74 ml/m  RA Volume:   32.20 ml  15.40 ml/m LA Vol (A4C):   37.7 ml 18.03 ml/m LA Biplane Vol: 38.9 ml 18.61 ml/m  AORTIC VALVE AV Area (Vmax):    2.70 cm AV Area (Vmean):   2.62 cm AV Area (VTI):     2.60 cm AV Vmax:           122.00 cm/s AV Vmean:          82.600 cm/s AV VTI:            0.287 m AV Peak Grad:      6.0 mmHg AV Mean Grad:      3.0 mmHg LVOT Vmax:         99.00 cm/s LVOT Vmean:        65.000 cm/s LVOT VTI:          0.224 m LVOT/AV VTI ratio: 0.78  AORTA Ao Root diam: 3.14 cm Ao Asc diam:  3.34 cm MITRAL VALVE               TRICUSPID VALVE MV Area (PHT): 2.45 cm    TR Peak grad:   10.4 mmHg MV Decel Time: 310 msec    TR Vmax:        161.00 cm/s MR  Peak grad: 14.7 mmHg MR Vmax:      192.00 cm/s  SHUNTS MV E velocity: 60.30 cm/s  Systemic VTI:  0.22 m MV A velocity: 90.10 cm/s  Systemic Diam: 2.06 cm MV E/A ratio:  0.67 Annabella Scarce MD Electronically signed by Annabella Scarce MD Signature Date/Time: 12/11/2023/5:48:22 PM    Final      ASSESSMENT & PLAN Taylor Berger 78 y.o. male with medical history significant for HTN, HLD, DVT ,GERD, and follicular lymphoma (diagnosed 06/2019 and under observation) presents to establish care with oncology for his follicular lymphoma.  After review the labs, review the records, discussion with the patient the findings are most consistent with a low-grade stage I follicular lymphoma of the lymph node of the abdomen.  At this time there is no indication for treatment as there is no bulky disease, no B symptoms, and no cytopenias.  We can continue to follow this patient in clinic with lab work and clinical follow-up.   #Grade IA Follicular Lymphoma --diagnosed in [place]  in March 2021. Biopsy confirms Grade IA follicular lymphoma with a Ki-67 of 5-10%. Currently on observation.  --Reviewed labs from today without any intervention  needed. CT abdomen/pelvis from 08/01/2021 revealed mesenteric lymphadenopathy measuring 4.3x 2.2 cm, not significantly enlarged from prior. Recommendation is to continue with observation and return in 6 months with lab work. Last CT scan showed stable disease, no indication for repeat imaging. Plan: -- Baseline labs to include CBC, CMP, LDH each visit.  -- labs today show white blood cell count 8.5, hemoglobin 14.5, MCV 92.5, platelets 235. Cr 1.11, LFTs WNL.  -- no need for routine imaging unless patient were to develop worsening blood counts or new symptoms.  --continue monitoring with q 6 month clinic visits and labs  #DVT x 2 episodes.  --Initially diagnosed with DVT of the right femoral through popliteal veins on 12/10/2018. Treated with 6 months of anticoagulation therapy. At initial  visit, discussed the risks of recurrent VTE and the recommendation for indefinite anticoagulation as it was likely an unprovoked event. Patient elected to continue with observation for recurrent clot. --On 03/30/20, patient had another DVT of the right lower extremity. Doppler US  was obtained that revealed acute DVT in the right gastrocnemius veins, right intramuscular calf vein. In addition, there were two pairs of acutely thrombosed veins appear to arise from the popliteal vein and are thrombosed to the mid calf. No acute DVT in the popliteal vein. There was evidence of chronic deep vein thrombosis involving the right femoral vein, and popliteal vein. Likely provoked by recent episode of diverticulitis. Patient was started on Xarelto  20mg  PO daily.  --Discussed the recommendation for indefinite anticoagulation due to recurrent episodes of DVTs, with initial episode that was likely unprovoked. Patient was in agreement with this recommendation.  Plan:  --continue Xaretlo 10 mg PO daily as maintenance therapy. Will continue indefinitely.  --RTC in 6 months time  #Health maintenance --Patient follow with GI for Barrett's esophagus.  --Patient is up to date on COVID and influenza vaccinations.   #Right mid thyroid  nodule --Underwent fine needle aspiration on 05/31/20, results show benign tissue.   No orders of the defined types were placed in this encounter.   All questions were answered. The patient knows to call the clinic with any problems, questions or concerns.  Norleen IVAR Kidney, MD Department of Hematology/Oncology [facility] Phone: [phone] Pager: [phone] Email: [email or web address]  01/10/2024 11:30 AM

## 2024-02-12 ENCOUNTER — Other Ambulatory Visit: Payer: Self-pay | Admitting: Physician Assistant

## 2024-03-13 ENCOUNTER — Other Ambulatory Visit: Payer: Self-pay | Admitting: Physician Assistant

## 2024-03-15 ENCOUNTER — Encounter: Payer: Self-pay | Admitting: Physician Assistant

## 2024-03-16 MED ORDER — RIVAROXABAN 10 MG PO TABS: 10.0000 mg | ORAL_TABLET | Freq: Every day | ORAL | 3 refills | Status: AC

## 2024-03-16 MED ORDER — VALACYCLOVIR HCL 500 MG PO TABS: 500.0000 mg | ORAL_TABLET | Freq: Every day | ORAL | 3 refills | Status: AC

## 2024-04-08 ENCOUNTER — Encounter: Payer: Self-pay | Admitting: Physician Assistant

## 2024-04-08 ENCOUNTER — Ambulatory Visit (INDEPENDENT_AMBULATORY_CARE_PROVIDER_SITE_OTHER): Admitting: Physician Assistant

## 2024-04-08 VITALS — BP 120/70 | HR 69 | Temp 98.4°F | Ht 68.0 in | Wt 212.0 lb

## 2024-04-08 DIAGNOSIS — Z981 Arthrodesis status: Secondary | ICD-10-CM | POA: Diagnosis not present

## 2024-04-08 DIAGNOSIS — G629 Polyneuropathy, unspecified: Secondary | ICD-10-CM | POA: Diagnosis not present

## 2024-04-08 NOTE — Progress Notes (Signed)
 "  History of Present Illness:   Chief Complaint  Patient presents with   Numbness    Pt c/o numbness in both feet, hands are stiff and very cold. Also having night sweats. Pt states his symptoms are getting worse, has had for years.    Discussed the use of AI scribe software for clinical note transcription with the patient, who gave verbal consent to proceed.  History of Present Illness   Taylor Berger is an 79 year old male with a history of neck and back surgeries who presents with worsening numbness and weakness in his extremities.  He had three cervical surgeries with fusion from C3 to C7 and a low back surgery. He has intermittent numbness that has recently worsened, especially in his legs, ankles, and feet. He notes declining grip strength, mainly in one hand, and cannot move his little finger inward.  He has bilateral carpal tunnel, ulnar nerve problems, and a rotator cuff issue with persistent significant hand weakness and loss of function, worse on one side despite prior treatment.  He has cold chills and night sweats that feel like a false cold, worse at night, and he wakes up with soaked bedding. These symptoms have been present for some time but recently intensified.  He has had an MRI of the neck and joint injections for trigger fingers with minimal relief.  He feels his balance is impaired and worries that nerve damage limits his ability to exercise. He has history of lymphoma and has not had a PET scan recently but has blood tests and check-ups every six months.        Past Medical History:  Diagnosis Date   Allergy    Seasonal   Aortic aneurysm    Cataract    Just diagnosed: 12/22 by S. [facility] follow-up pending   Depression    DVT (deep venous thrombosis) (HCC) 2020   GERD (gastroesophageal reflux disease)    Goiter    Hyperlipidemia    Hypertension    Lymphoma (HCC) 2020   Follicular, stomach   Neuropathy of both feet      Social  History[1]  Past Surgical History:  Procedure Laterality Date   CARPAL TUNNEL RELEASE Left    CERVICAL DISCECTOMY  1995   C-7   CERVICAL FUSION  01/1989   C-3 through C-6 / no metal   COLONOSCOPY     ELBOW SURGERY Left    ELBOW SURGERY     FOOT SURGERY Left 1999   Plantar fascitis   low back fusion  2013   Lumbar & Low Thoracic with metal   SHOULDER ARTHROSCOPY WITH ROTATOR CUFF REPAIR Right 2014   SPINE SURGERY     2010 C-3-7 Fusion; approx. 2015 Low back fusion   UPPER GASTROINTESTINAL ENDOSCOPY      Family History  Problem Relation Age of Onset   Diabetes Mother    Hypertension Mother    Hyperlipidemia Mother    Heart failure Mother    Heart disease Mother    Leukemia Father    Stroke Father    Cancer Father    Diabetes Father    Hyperlipidemia Father    Hypertension Father    Esophageal cancer Neg Hx    Colon polyps Neg Hx    Pancreatic cancer Neg Hx    Stomach cancer Neg Hx    Rectal cancer Neg Hx     Allergies[2]  Current Medications:  Current Medications[3]   Review of Systems:   Negative  unless otherwise specified per HPI.  Vitals:   Vitals:   04/08/24 1343  BP: 120/70  Pulse: 69  Temp: 98.4 F (36.9 C)  TempSrc: Temporal  SpO2: 95%  Weight: 212 lb (96.2 kg)  Height: 5' 8 (1.727 m)     Body mass index is 32.23 kg/m.  Physical Exam:   Physical Exam Vitals and nursing note reviewed.  Constitutional:      Appearance: He is well-developed.  HENT:     Head: Normocephalic.  Eyes:     Conjunctiva/sclera: Conjunctivae normal.     Pupils: Pupils are equal, round, and reactive to light.  Pulmonary:     Effort: Pulmonary effort is normal.  Musculoskeletal:        General: Normal range of motion.     Cervical back: Normal range of motion.  Skin:    General: Skin is warm and dry.  Neurological:     Mental Status: He is alert and oriented to person, place, and time.  Psychiatric:        Behavior: Behavior normal.        Thought  Content: Thought content normal.        Judgment: Judgment normal.     Assessment and Plan:   Assessment and Plan    Neuropathy; History of spinal fusion Chronic polyneuropathy with worsening numbness in legs, ankles, feet, and grip strength loss. Differential includes neuropathy related to past spinal surgeries or other neurological conditions. Discussed lumbar xray today - he declined. - Referred to neurosurgery for further evaluation and management. - Recommend that he considers physical therapy -- he will consider at this time but not ready to commit   Lucie Buttner, PA-C    [1]  Social History Tobacco Use   Smoking status: Never   Smokeless tobacco: Never  Vaping Use   Vaping status: Never Used  Substance Use Topics   Alcohol use: Not Currently   Drug use: Never  [2] No Known Allergies [3]  Current Outpatient Medications:    amLODipine  (NORVASC ) 10 MG tablet, TAKE 1 TABLET BY MOUTH DAILY, Disp: 90 tablet, Rfl: 1   atorvastatin  (LIPITOR) 80 MG tablet, TAKE 1 TABLET BY MOUTH DAILY, Disp: 90 tablet, Rfl: 1   cetirizine (ZYRTEC) 10 MG tablet, Take 10 mg by mouth daily., Disp: , Rfl:    clindamycin  (CLEOCIN -T) 1 % external solution, Apply topically 2 (two) times daily as needed., Disp: 30 mL, Rfl: 1   dicyclomine  (BENTYL ) 10 MG capsule, Take 1 capsule (10 mg total) by mouth 2 (two) times daily as needed for spasms (abdominal pain)., Disp: 30 capsule, Rfl: 0   esomeprazole  (NEXIUM ) 20 MG capsule, Take 20 mg by mouth daily at 12 noon., Disp: , Rfl:    fluticasone (FLONASE) 50 MCG/ACT nasal spray, Place into both nostrils as needed for allergies or rhinitis., Disp: , Rfl:    hydrochlorothiazide  (HYDRODIURIL ) 12.5 MG tablet, TAKE 1 TABLET BY MOUTH DAILY, Disp: 30 tablet, Rfl: 1   lisinopril  (ZESTRIL ) 20 MG tablet, Take 20 mg by mouth daily., Disp: , Rfl:    Multiple Vitamin (MULTIVITAMIN) tablet, Take 1 tablet by mouth daily., Disp: , Rfl:    Probiotic Product (PROBIOTIC  GUMMIES PO), Take 3 each by mouth daily in the afternoon., Disp: , Rfl:    psyllium (REGULOID) 0.52 g capsule, Take 2 capsules by mouth daily., Disp: , Rfl:    rivaroxaban  (XARELTO ) 10 MG TABS tablet, Take 1 tablet (10 mg total) by mouth daily., Disp: 100 tablet,  Rfl: 3   sertraline  (ZOLOFT ) 50 MG tablet, Take 1 tablet (50 mg total) by mouth daily., Disp: 90 tablet, Rfl: 1   triamcinolone  (KENALOG ) 0.025 % cream, Apply 1 Application topically 2 (two) times daily., Disp: , Rfl:    valACYclovir  (VALTREX ) 500 MG tablet, Take 1 tablet (500 mg total) by mouth daily., Disp: 100 tablet, Rfl: 3  "

## 2024-04-23 NOTE — Progress Notes (Unsigned)
 "   Referring Physician:  Job Lukes, PA [address] [place],  [place] [address]  Primary Physician:  Job Lukes, [place]  History of Present Illness: 04/23/2024 [name] is here today with a chief complaint of ***   Chronic polyneuropathy with worsening numbness in legs, ankles, feet. Carpal tunnel syndrome and grip strength loss.    PreCharting for nerve patients : EMG? - 02/28/2021 Imaging of Nerve, Ultrasound or MRI? -  Prior Surgery?- fusion from C3-C7 in 2010 Hand Therapy at [facility] in 2022    Is this a Second Opinion: {yes/no:20286}  Main Question for Surgeon: ***  Review of Systems:  A 10 point review of systems is negative, except for the pertinent positives and negatives detailed in the HPI.  Past Medical History: Past Medical History:  Diagnosis Date   Allergy    Seasonal   Aortic aneurysm    Cataract    Just diagnosed: 12/22 by [facility] follow-up pending   Depression    DVT (deep venous thrombosis) (HCC) 2020   GERD (gastroesophageal reflux disease)    Goiter    Hyperlipidemia    Hypertension    Lymphoma (HCC) 2020   Follicular, stomach   Neuropathy of both feet     Past Surgical History: Past Surgical History:  Procedure Laterality Date   CARPAL TUNNEL RELEASE Left    CERVICAL DISCECTOMY  1995   C-7   CERVICAL FUSION  01/1989   C-3 through C-6 / no metal   COLONOSCOPY     ELBOW SURGERY Left    ELBOW SURGERY     FOOT SURGERY Left 1999   Plantar fascitis   low back fusion  2013   Lumbar & Low Thoracic with metal   SHOULDER ARTHROSCOPY WITH ROTATOR CUFF REPAIR Right 2014   SPINE SURGERY     2010 C-3-7 Fusion; approx. 2015 Low back fusion   UPPER GASTROINTESTINAL ENDOSCOPY      Allergies: Allergies as of 05/04/2024   (No Known Allergies)    Medications: Current Medications[1]  Social History: Social History[2]  Family Medical History: Family History  Problem Relation Age of Onset    Diabetes Mother    Hypertension Mother    Hyperlipidemia Mother    Heart failure Mother    Heart disease Mother    Leukemia Father    Stroke Father    Cancer Father    Diabetes Father    Hyperlipidemia Father    Hypertension Father    Esophageal cancer Neg Hx    Colon polyps Neg Hx    Pancreatic cancer Neg Hx    Stomach cancer Neg Hx    Rectal cancer Neg Hx     Physical Examination: There were no vitals filed for this visit.  General: Patient is in no apparent distress. Attention to examination is appropriate.  Neck:   Supple.  Full range of motion.  Respiratory: Patient is breathing without any difficulty.   NEUROLOGICAL:     Awake, alert, oriented to person, place, and time.  Speech is clear and fluent.   Cranial Nerves: Pupils equal round and reactive to light.  Facial tone is symmetric. Shoulder shrug is symmetric. Tongue protrusion is midline.  There is no pronator drift.  Motor Exam:  ***  Reflexes are ***2+ and symmetric at the biceps, triceps, brachioradialis, patella and achilles.   Hoffman's is absent. Clonus is Absent  Bilateral upper and lower extremity sensation is intact to light touch ***.  Gait is normal.  ***   Medical Decision Making  Imaging: ***  Electrodiagnostics: ***  I have personally reviewed the images and electrodiagnostics and agree with the above interpretation.  Assessment and Plan: [name] is a pleasant 79 y.o. male with ***. The symptoms are causing a significant impact on the patient's life. I have utilized the care everywhere function in epic to review the outside records available from external health systems, and have personally reviewed relevant imaging and electrodiagnostic workup.    Thank you for involving me in the care of this patient.    Penne MICAEL Sharps MD/MSCR Neurosurgery - Peripheral Nerve Surgery       [1]  Current Outpatient Medications:    amLODipine  (NORVASC ) 10 MG tablet, TAKE 1 TABLET BY  MOUTH DAILY, Disp: 90 tablet, Rfl: 1   atorvastatin  (LIPITOR) 80 MG tablet, TAKE 1 TABLET BY MOUTH DAILY, Disp: 90 tablet, Rfl: 1   cetirizine (ZYRTEC) 10 MG tablet, Take 10 mg by mouth daily., Disp: , Rfl:    clindamycin  (CLEOCIN -T) 1 % external solution, Apply topically 2 (two) times daily as needed., Disp: 30 mL, Rfl: 1   dicyclomine  (BENTYL ) 10 MG capsule, Take 1 capsule (10 mg total) by mouth 2 (two) times daily as needed for spasms (abdominal pain)., Disp: 30 capsule, Rfl: 0   esomeprazole  (NEXIUM ) 20 MG capsule, Take 20 mg by mouth daily at 12 noon., Disp: , Rfl:    fluticasone (FLONASE) 50 MCG/ACT nasal spray, Place into both nostrils as needed for allergies or rhinitis., Disp: , Rfl:    hydrochlorothiazide  (HYDRODIURIL ) 12.5 MG tablet, TAKE 1 TABLET BY MOUTH DAILY, Disp: 30 tablet, Rfl: 1   lisinopril  (ZESTRIL ) 20 MG tablet, Take 20 mg by mouth daily., Disp: , Rfl:    Multiple Vitamin (MULTIVITAMIN) tablet, Take 1 tablet by mouth daily., Disp: , Rfl:    Probiotic Product (PROBIOTIC GUMMIES PO), Take 3 each by mouth daily in the afternoon., Disp: , Rfl:    psyllium (REGULOID) 0.52 g capsule, Take 2 capsules by mouth daily., Disp: , Rfl:    rivaroxaban  (XARELTO ) 10 MG TABS tablet, Take 1 tablet (10 mg total) by mouth daily., Disp: 100 tablet, Rfl: 3   sertraline  (ZOLOFT ) 50 MG tablet, Take 1 tablet (50 mg total) by mouth daily., Disp: 90 tablet, Rfl: 1   triamcinolone  (KENALOG ) 0.025 % cream, Apply 1 Application topically 2 (two) times daily., Disp: , Rfl:    valACYclovir  (VALTREX ) 500 MG tablet, Take 1 tablet (500 mg total) by mouth daily., Disp: 100 tablet, Rfl: 3 [2]  Social History Tobacco Use   Smoking status: Never   Smokeless tobacco: Never  Vaping Use   Vaping status: Never Used  Substance Use Topics   Alcohol use: Not Currently   Drug use: Never   "

## 2024-05-04 ENCOUNTER — Ambulatory Visit: Admitting: Neurosurgery

## 2024-05-04 ENCOUNTER — Encounter: Payer: Self-pay | Admitting: Physician Assistant

## 2024-05-04 DIAGNOSIS — G629 Polyneuropathy, unspecified: Secondary | ICD-10-CM

## 2024-06-10 ENCOUNTER — Ambulatory Visit: Admitting: Physician Assistant

## 2024-07-10 ENCOUNTER — Ambulatory Visit: Admitting: Hematology and Oncology

## 2024-07-10 ENCOUNTER — Other Ambulatory Visit

## 2024-09-17 ENCOUNTER — Ambulatory Visit
